# Patient Record
Sex: Female | Born: 1960 | Race: White | Hispanic: No | Marital: Married | Smoking: Former smoker
Health system: Southern US, Community
[De-identification: ages and names within clinical notes are randomized; demographics above are authoritative.]

## PROBLEM LIST (undated history)

## (undated) DIAGNOSIS — G4733 Obstructive sleep apnea (adult) (pediatric): Secondary | ICD-10-CM

## (undated) DIAGNOSIS — E039 Hypothyroidism, unspecified: Secondary | ICD-10-CM

## (undated) HISTORY — PX: EXCISIONAL HEMORRHOIDECTOMY: SHX1541

## (undated) HISTORY — DX: Hypothyroidism, unspecified: E03.9

---

## 1998-11-28 ENCOUNTER — Other Ambulatory Visit: Admission: RE | Admit: 1998-11-28 | Discharge: 1998-11-28 | Payer: Self-pay | Admitting: Obstetrics & Gynecology

## 1999-12-25 ENCOUNTER — Other Ambulatory Visit: Admission: RE | Admit: 1999-12-25 | Discharge: 1999-12-25 | Payer: Self-pay | Admitting: Obstetrics & Gynecology

## 2001-01-27 ENCOUNTER — Other Ambulatory Visit: Admission: RE | Admit: 2001-01-27 | Discharge: 2001-01-27 | Payer: Self-pay | Admitting: Obstetrics & Gynecology

## 2002-01-19 ENCOUNTER — Observation Stay (HOSPITAL_COMMUNITY): Admission: EM | Admit: 2002-01-19 | Discharge: 2002-01-20 | Payer: Self-pay

## 2002-03-08 ENCOUNTER — Other Ambulatory Visit: Admission: RE | Admit: 2002-03-08 | Discharge: 2002-03-08 | Payer: Self-pay | Admitting: Obstetrics & Gynecology

## 2002-04-30 ENCOUNTER — Emergency Department (HOSPITAL_COMMUNITY): Admission: EM | Admit: 2002-04-30 | Discharge: 2002-04-30 | Payer: Self-pay | Admitting: Emergency Medicine

## 2003-03-15 ENCOUNTER — Other Ambulatory Visit: Admission: RE | Admit: 2003-03-15 | Discharge: 2003-03-15 | Payer: Self-pay | Admitting: Obstetrics & Gynecology

## 2004-08-07 ENCOUNTER — Other Ambulatory Visit: Admission: RE | Admit: 2004-08-07 | Discharge: 2004-08-07 | Payer: Self-pay | Admitting: Obstetrics & Gynecology

## 2005-08-06 ENCOUNTER — Other Ambulatory Visit: Admission: RE | Admit: 2005-08-06 | Discharge: 2005-08-06 | Payer: Self-pay | Admitting: Obstetrics & Gynecology

## 2010-05-21 ENCOUNTER — Emergency Department (HOSPITAL_COMMUNITY): Admission: EM | Admit: 2010-05-21 | Discharge: 2010-05-21 | Payer: Self-pay | Admitting: Emergency Medicine

## 2010-05-21 IMAGING — CR DG CHEST 1V PORT
1 series · 1 of 1 positions shown · non-contrast
Comparison: None.

CLINICAL DATA: Chest pain.  Weakness.  Shoulder pain.

PORTABLE CHEST - 1 VIEW

[series [date]]
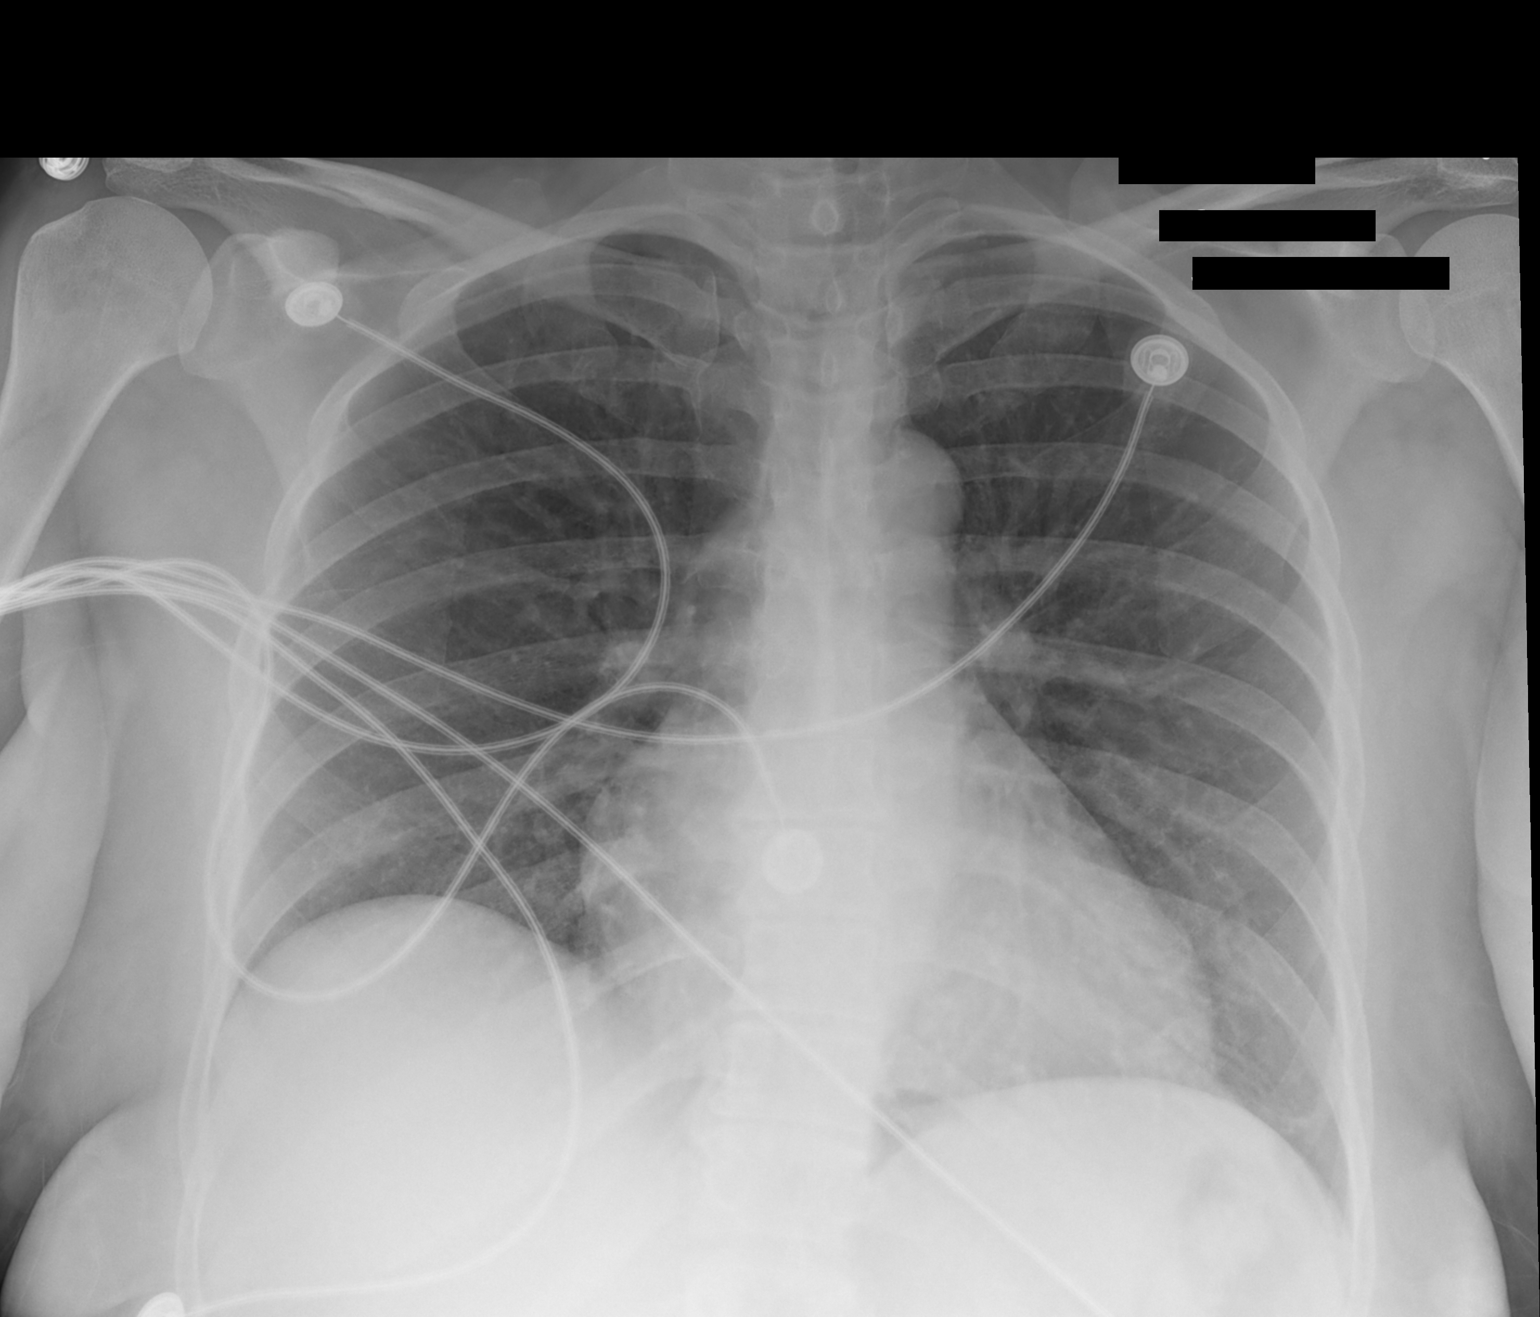

[1 of 1 positions shown; findings below may reference images not displayed]

FINDINGS: 2128 hours. The heart size and mediastinal contours are
normal.  The lungs are clear.  There is no pleural effusion or
pneumothorax.  No acute osseous findings are identified.  There are
multiple telemetry leads overlying the chest.
IMPRESSION: No active cardiopulmonary process.

## 2010-10-24 LAB — POCT CARDIAC MARKERS
CKMB, poc: <1 ng/mL — ABNORMAL LOW (ref 1.0–8.0)
Myoglobin, poc: 53.1 ng/mL (ref 12–200)
Troponin i, poc: <0.05 ng/mL (ref 0.00–0.09)

## 2010-10-24 LAB — POCT I-STAT, CHEM 8
BUN: 5 mg/dL — ABNORMAL LOW (ref 6–23)
Calcium, Ion: 1.17 mmol/L (ref 1.12–1.32)
Chloride: 104 mEq/L (ref 96–112)
Creatinine, Ser: 0.5 mg/dL (ref 0.4–1.2)
Glucose, Bld: 104 mg/dL — ABNORMAL HIGH (ref 70–99)
HCT: 41 % (ref 36.0–46.0)
Hemoglobin: 13.9 g/dL (ref 12.0–15.0)
Potassium: 3.5 mEq/L (ref 3.5–5.1)
Sodium: 140 mEq/L (ref 135–145)
TCO2: 27 mmol/L (ref 0–100)

## 2014-08-24 ENCOUNTER — Other Ambulatory Visit: Payer: Self-pay | Admitting: Obstetrics & Gynecology

## 2014-08-24 DIAGNOSIS — R928 Other abnormal and inconclusive findings on diagnostic imaging of breast: Secondary | ICD-10-CM

## 2014-09-05 ENCOUNTER — Ambulatory Visit
Admission: RE | Admit: 2014-09-05 | Discharge: 2014-09-05 | Disposition: A | Discharge status: Home / Self Care | Payer: BLUE CROSS/BLUE SHIELD | Source: Ambulatory Visit | Attending: Obstetrics & Gynecology | Admitting: Obstetrics & Gynecology

## 2014-09-05 DIAGNOSIS — R928 Other abnormal and inconclusive findings on diagnostic imaging of breast: Secondary | ICD-10-CM

## 2016-01-26 ENCOUNTER — Encounter: Payer: Self-pay | Admitting: Gastroenterology

## 2016-03-15 ENCOUNTER — Encounter (INDEPENDENT_AMBULATORY_CARE_PROVIDER_SITE_OTHER): Payer: Self-pay

## 2016-03-15 ENCOUNTER — Encounter: Payer: Self-pay | Admitting: Gastroenterology

## 2016-03-15 ENCOUNTER — Ambulatory Visit (AMBULATORY_SURGERY_CENTER): Payer: Self-pay

## 2016-03-15 VITALS — Ht 64.5 in | Wt 215.4 lb

## 2016-03-15 DIAGNOSIS — Z1211 Encounter for screening for malignant neoplasm of colon: Secondary | ICD-10-CM

## 2016-03-15 MED ORDER — SUPREP BOWEL PREP KIT 17.5-3.13-1.6 GM/177ML PO SOLN: 1.0000 | Freq: Once | ORAL | 0 refills | Status: AC

## 2016-03-18 ENCOUNTER — Telehealth: Payer: Self-pay | Admitting: *Deleted

## 2016-03-18 NOTE — Telephone Encounter (Signed)
LM for the patient asking to please call me back.  The date of the colon is 03-29-2016.  I let the patient know that the prep could cost around $ 150.00. With this coupon she shouldn't have to pay any more than $ 50.00.

## 2016-03-19 ENCOUNTER — Other Ambulatory Visit: Payer: Self-pay | Admitting: *Deleted

## 2016-03-19 NOTE — Telephone Encounter (Signed)
Called the patient and asked her for her pharmacy.She said it is [facility] [address]. She changed her date to October for the colonoscopy.  I advised that the prep can be as much as $ 150.00, the coupon we have she would not have to pay any more than $ 50.00.  She thanked me for that . I asked if she wanted to come here for it or could I mail it to her and she asked me to mail it. Mailed it today 03-19-2016. I verified the address with the patient.

## 2016-03-29 ENCOUNTER — Encounter: Payer: BLUE CROSS/BLUE SHIELD | Admitting: Gastroenterology

## 2016-06-04 ENCOUNTER — Encounter: Payer: BLUE CROSS/BLUE SHIELD | Admitting: Gastroenterology

## 2020-03-29 ENCOUNTER — Other Ambulatory Visit: Payer: Self-pay

## 2020-03-29 ENCOUNTER — Other Ambulatory Visit: Payer: Self-pay | Admitting: Critical Care Medicine

## 2020-03-29 DIAGNOSIS — Z20822 Contact with and (suspected) exposure to covid-19: Secondary | ICD-10-CM

## 2020-03-30 LAB — SARS-COV-2, NAA 2 DAY TAT

## 2020-03-30 LAB — NOVEL CORONAVIRUS, NAA: SARS-CoV-2, NAA: DETECTED — AB

## 2023-06-13 ENCOUNTER — Inpatient Hospital Stay (HOSPITAL_COMMUNITY): Payer: Self-pay

## 2023-06-13 ENCOUNTER — Emergency Department (HOSPITAL_COMMUNITY): Payer: Self-pay

## 2023-06-13 ENCOUNTER — Encounter (HOSPITAL_COMMUNITY): Payer: Self-pay | Admitting: Emergency Medicine

## 2023-06-13 ENCOUNTER — Inpatient Hospital Stay (HOSPITAL_COMMUNITY)
Admission: EM | Admit: 2023-06-13 | Discharge: 2023-06-17 | DRG: 062 | Disposition: A | Discharge status: Rehabilitation Facility | Payer: Self-pay | Attending: Neurology | Admitting: Neurology

## 2023-06-13 ENCOUNTER — Other Ambulatory Visit: Payer: Self-pay

## 2023-06-13 DIAGNOSIS — M62838 Other muscle spasm: Secondary | ICD-10-CM | POA: Diagnosis present

## 2023-06-13 DIAGNOSIS — I10 Essential (primary) hypertension: Secondary | ICD-10-CM | POA: Diagnosis present

## 2023-06-13 DIAGNOSIS — F17210 Nicotine dependence, cigarettes, uncomplicated: Secondary | ICD-10-CM | POA: Diagnosis present

## 2023-06-13 DIAGNOSIS — Z8673 Personal history of transient ischemic attack (TIA), and cerebral infarction without residual deficits: Secondary | ICD-10-CM

## 2023-06-13 DIAGNOSIS — E119 Type 2 diabetes mellitus without complications: Secondary | ICD-10-CM | POA: Diagnosis present

## 2023-06-13 DIAGNOSIS — I160 Hypertensive urgency: Secondary | ICD-10-CM | POA: Diagnosis present

## 2023-06-13 DIAGNOSIS — I639 Cerebral infarction, unspecified: Principal | ICD-10-CM | POA: Diagnosis present

## 2023-06-13 DIAGNOSIS — K5900 Constipation, unspecified: Secondary | ICD-10-CM | POA: Diagnosis present

## 2023-06-13 DIAGNOSIS — G8194 Hemiplegia, unspecified affecting left nondominant side: Secondary | ICD-10-CM | POA: Diagnosis present

## 2023-06-13 DIAGNOSIS — E876 Hypokalemia: Secondary | ICD-10-CM | POA: Diagnosis present

## 2023-06-13 DIAGNOSIS — I6389 Other cerebral infarction: Principal | ICD-10-CM | POA: Diagnosis present

## 2023-06-13 DIAGNOSIS — R29810 Facial weakness: Secondary | ICD-10-CM | POA: Diagnosis present

## 2023-06-13 DIAGNOSIS — R29705 NIHSS score 5: Secondary | ICD-10-CM | POA: Diagnosis present

## 2023-06-13 DIAGNOSIS — Z79899 Other long term (current) drug therapy: Secondary | ICD-10-CM

## 2023-06-13 DIAGNOSIS — E785 Hyperlipidemia, unspecified: Secondary | ICD-10-CM | POA: Diagnosis present

## 2023-06-13 DIAGNOSIS — Z6838 Body mass index (BMI) 38.0-38.9, adult: Secondary | ICD-10-CM

## 2023-06-13 DIAGNOSIS — I161 Hypertensive emergency: Secondary | ICD-10-CM

## 2023-06-13 DIAGNOSIS — R471 Dysarthria and anarthria: Secondary | ICD-10-CM | POA: Diagnosis present

## 2023-06-13 DIAGNOSIS — F17200 Nicotine dependence, unspecified, uncomplicated: Secondary | ICD-10-CM | POA: Diagnosis present

## 2023-06-13 DIAGNOSIS — F419 Anxiety disorder, unspecified: Secondary | ICD-10-CM | POA: Diagnosis present

## 2023-06-13 DIAGNOSIS — G4733 Obstructive sleep apnea (adult) (pediatric): Secondary | ICD-10-CM | POA: Diagnosis present

## 2023-06-13 DIAGNOSIS — E669 Obesity, unspecified: Secondary | ICD-10-CM | POA: Diagnosis present

## 2023-06-13 HISTORY — DX: Obstructive sleep apnea (adult) (pediatric): G47.33

## 2023-06-13 LAB — DIFFERENTIAL
Abs Immature Granulocytes: 0.02 10*3/uL (ref 0.00–0.07)
Basophils Absolute: 0.1 10*3/uL (ref 0.0–0.1)
Basophils Relative: 1 %
Eosinophils Absolute: 0.2 10*3/uL (ref 0.0–0.5)
Eosinophils Relative: 2 %
Immature Granulocytes: 0 %
Lymphocytes Relative: 33 %
Lymphs Abs: 3.2 10*3/uL (ref 0.7–4.0)
Monocytes Absolute: 0.6 10*3/uL (ref 0.1–1.0)
Monocytes Relative: 6 %
Neutro Abs: 5.5 10*3/uL (ref 1.7–7.7)
Neutrophils Relative %: 58 %

## 2023-06-13 LAB — COMPREHENSIVE METABOLIC PANEL
ALT: 20 U/L (ref 0–44)
AST: 19 U/L (ref 15–41)
Albumin: 4.1 g/dL (ref 3.5–5.0)
Alkaline Phosphatase: 71 U/L (ref 38–126)
Anion gap: 11 (ref 5–15)
BUN: 16 mg/dL (ref 6–20)
CO2: 28 mmol/L (ref 22–32)
Calcium: 9.6 mg/dL (ref 8.9–10.3)
Chloride: 97 mmol/L — ABNORMAL LOW (ref 98–111)
Creatinine, Ser: 0.85 mg/dL (ref 0.44–1.00)
GFR, Estimated: >60 mL/min (ref >60)
Glucose, Bld: 124 mg/dL — ABNORMAL HIGH (ref 70–99)
Potassium: 3.1 mmol/L — ABNORMAL LOW (ref 3.5–5.1)
Sodium: 136 mmol/L (ref 135–145)
Total Bilirubin: 0.2 mg/dL — ABNORMAL LOW (ref 0.3–1.2)
Total Protein: 7.4 g/dL (ref 6.5–8.1)

## 2023-06-13 LAB — URINALYSIS, ROUTINE W REFLEX MICROSCOPIC
Bilirubin Urine: NEGATIVE
Glucose, UA: NEGATIVE mg/dL
Ketones, ur: NEGATIVE mg/dL
Leukocytes,Ua: NEGATIVE
Nitrite: NEGATIVE
Protein, ur: NEGATIVE mg/dL
Specific Gravity, Urine: 1.027 (ref 1.005–1.030)
pH: 6 (ref 5.0–8.0)

## 2023-06-13 LAB — PROTIME-INR
INR: 0.9 (ref 0.8–1.2)
Prothrombin Time: 12.5 s (ref 11.4–15.2)

## 2023-06-13 LAB — I-STAT CHEM 8, ED
BUN: 16 mg/dL (ref 6–20)
Calcium, Ion: 1.2 mmol/L (ref 1.15–1.40)
Chloride: 102 mmol/L (ref 98–111)
Creatinine, Ser: 0.9 mg/dL (ref 0.44–1.00)
Glucose, Bld: 118 mg/dL — ABNORMAL HIGH (ref 70–99)
HCT: 40 % (ref 36.0–46.0)
Hemoglobin: 13.6 g/dL (ref 12.0–15.0)
Potassium: 3.4 mmol/L — ABNORMAL LOW (ref 3.5–5.1)
Sodium: 141 mmol/L (ref 135–145)
TCO2: 27 mmol/L (ref 22–32)

## 2023-06-13 LAB — CBC
HCT: 39.7 % (ref 36.0–46.0)
Hemoglobin: 12.6 g/dL (ref 12.0–15.0)
MCH: 27.7 pg (ref 26.0–34.0)
MCHC: 31.7 g/dL (ref 30.0–36.0)
MCV: 87.3 fL (ref 80.0–100.0)
Platelets: 299 10*3/uL (ref 150–400)
RBC: 4.55 MIL/uL (ref 3.87–5.11)
RDW: 13.1 % (ref 11.5–15.5)
WBC: 9.5 10*3/uL (ref 4.0–10.5)
nRBC: 0 % (ref 0.0–0.2)

## 2023-06-13 LAB — RAPID URINE DRUG SCREEN, HOSP PERFORMED
Amphetamines: NOT DETECTED
Barbiturates: NOT DETECTED
Benzodiazepines: NOT DETECTED
Cocaine: NOT DETECTED
Opiates: NOT DETECTED
Tetrahydrocannabinol: NOT DETECTED

## 2023-06-13 LAB — APTT: aPTT: 25 s (ref 24–36)

## 2023-06-13 LAB — CBG MONITORING, ED: Glucose-Capillary: 132 mg/dL — ABNORMAL HIGH (ref 70–99)

## 2023-06-13 LAB — ETHANOL: Alcohol, Ethyl (B): <10 mg/dL (ref <10)

## 2023-06-13 LAB — HCG, SERUM, QUALITATIVE: Preg, Serum: NEGATIVE

## 2023-06-13 MED ORDER — SENNOSIDES-DOCUSATE SODIUM 8.6-50 MG PO TABS: 1.0000 | ORAL_TABLET | Freq: Every evening | ORAL | Status: DC | PRN

## 2023-06-13 MED ORDER — STROKE: EARLY STAGES OF RECOVERY BOOK
Freq: Once | Status: AC
Start: 2023-06-14 — End: 2023-06-14
  Filled 2023-06-13: qty 1

## 2023-06-13 MED ORDER — POTASSIUM CHLORIDE 10 MEQ/100ML IV SOLN
10.0000 meq | INTRAVENOUS | Status: AC
  Administered 2023-06-13 (×2): 10 meq via INTRAVENOUS
  Filled 2023-06-13 (×2): qty 100

## 2023-06-13 MED ORDER — LABETALOL HCL 5 MG/ML IV SOLN
10.0000 mg | Freq: Once | INTRAVENOUS | Status: AC
  Administered 2023-06-13: 10 mg via INTRAVENOUS
  Filled 2023-06-13: qty 4

## 2023-06-13 MED ORDER — ACETAMINOPHEN 325 MG PO TABS
650.0000 mg | ORAL_TABLET | ORAL | Status: DC | PRN
  Filled 2023-06-13: qty 2

## 2023-06-13 MED ORDER — ACETAMINOPHEN 160 MG/5ML PO SOLN
650.0000 mg | ORAL | Status: DC | PRN
Start: 2023-06-13 — End: 2023-06-14

## 2023-06-13 MED ORDER — IOHEXOL 350 MG/ML SOLN
75.0000 mL | Freq: Once | INTRAVENOUS | Status: AC | PRN
  Administered 2023-06-13: 75 mL via INTRAVENOUS

## 2023-06-13 MED ORDER — HYDRALAZINE HCL 20 MG/ML IJ SOLN
10.0000 mg | Freq: Once | INTRAMUSCULAR | Status: DC
  Filled 2023-06-13: qty 1

## 2023-06-13 MED ORDER — TENECTEPLASE FOR STROKE
25.0000 mg | PACK | Freq: Once | INTRAVENOUS | Status: AC
Start: 2023-06-13 — End: 2023-06-13
  Administered 2023-06-13: 25 mg via INTRAVENOUS
  Filled 2023-06-13 (×2): qty 10

## 2023-06-13 MED ORDER — ACETAMINOPHEN 650 MG RE SUPP
650.0000 mg | RECTAL | Status: DC | PRN
  Administered 2023-06-14: 650 mg via RECTAL
  Filled 2023-06-13: qty 1

## 2023-06-13 MED ORDER — SODIUM CHLORIDE 0.9 % IV SOLN: INTRAVENOUS | Status: DC

## 2023-06-13 MED ORDER — CLEVIDIPINE BUTYRATE 0.5 MG/ML IV EMUL
0.0000 mg/h | INTRAVENOUS | Status: DC
  Administered 2023-06-13: 2 mg/h via INTRAVENOUS
  Administered 2023-06-14: 4 mg/h via INTRAVENOUS
  Filled 2023-06-13 (×3): qty 50

## 2023-06-13 MED ORDER — PANTOPRAZOLE SODIUM 40 MG IV SOLR
40.0000 mg | Freq: Every day | INTRAVENOUS | Status: DC
  Administered 2023-06-13 – 2023-06-15 (×3): 40 mg via INTRAVENOUS
  Filled 2023-06-13 (×3): qty 10

## 2023-06-13 MED ORDER — POTASSIUM CHLORIDE CRYS ER 20 MEQ PO TBCR: 40.0000 meq | EXTENDED_RELEASE_TABLET | Freq: Once | ORAL | Status: DC

## 2023-06-13 NOTE — ED Notes (Signed)
Pt back from MRI 

## 2023-06-13 NOTE — ED Notes (Signed)
PA called to bedside to assess patient.

## 2023-06-13 NOTE — ED Notes (Signed)
[facility] called for CODE STROKE.

## 2023-06-13 NOTE — ED Provider Triage Note (Signed)
Emergency Medicine Provider Triage Evaluation Note  Rebekah Mendez , a 62 y.o. female  was evaluated in triage.  Pt complains of left sided numbness. States that about 30 minutes ago while eating dinner began to experience left sided numbness in upper and lower extremity. When attempting to walk, felt that left leg could not move well and was unable to lift leg off ground fully. Feels that symptoms have resolved, but still having mild sensation impairment to the left side. Left arm worse than left leg. Mild headache with notable HTN, but not on any BP medications.  Review of Systems  Positive: As above Negative: As above  Physical Exam  BP (!) 215/116   Pulse 74   Resp 16   Ht 5' 4.5" (1.638 m)   Wt 102.1 kg   SpO2 97%   BMI 38.02 kg/m  Gen:   Awake, no distress   Resp:  Normal effort  MSK:   Moves extremities without difficulty  Other:  Neurologically at baseline, pupils PERRL, strength in upper and lower extremities nearly identical and possibly slightly diminished on left side  Medical Decision Making  Medically screening exam initiated at 7:16 PM.  Appropriate orders placed.  Rebekah Mendez was informed that the remainder of the evaluation will be completed by another provider, this initial triage assessment does not replace that evaluation, and the importance of remaining in the ED until their evaluation is complete.  Given some paraesthesia on left side with HTN with BP >180/>110, will initiate code stroke for emergent CT head and labs.   Smitty Knudsen, PA-C 06/13/23 1916

## 2023-06-13 NOTE — ED Provider Notes (Signed)
[place] EMERGENCY DEPARTMENT AT [facility] Provider Note   CSN: [id number] Arrival date & time: 06/13/23  1847     History  Chief Complaint  Patient presents with   Numbness    Rebekah Mendez is a 62 y.o. female.  Patient is a 61 year old female with no known past medical history presenting to the emergency department with left-sided numbness and weakness.  The patient states that she finished eating dinner around 6:30 PM this evening and when she tried to get up she felt like she was unable to lift her left leg felt heavy and numb.  She states that her left arm feels heavy and numb as well as the left side of her face.  She denies any vision changes.  She states that she has a mild headache.  She denies any blood thinner use.  The history is provided by the patient and the spouse. The history is limited by the condition of the patient (critical acuity).       Home Medications Prior to Admission medications   Medication Sig Start Date End Date Taking? Authorizing Provider  phentermine 37.5 MG capsule Take 37.5 mg by mouth every morning.    [provider]      Allergies    Patient has no known allergies.    Review of Systems   Review of Systems  Physical Exam Updated Vital Signs BP (!) 174/87   Pulse 78   Temp (!) 97.5 F (36.4 C) (Oral)   Resp 12   Ht 5' 4.5" (1.638 m)   Wt 109.7 kg   SpO2 99%   BMI 40.86 kg/m  Physical Exam Vitals and nursing note reviewed.  Constitutional:      Appearance: Normal appearance. She is obese.     Comments: Anxious appearing, tearful on exam  HENT:     Head: Normocephalic and atraumatic.     Nose: Nose normal.     Mouth/Throat:     Mouth: Mucous membranes are moist.     Pharynx: Oropharynx is clear.  Eyes:     Extraocular Movements: Extraocular movements intact.     Conjunctiva/sclera: Conjunctivae normal.     Pupils: Pupils are equal, round, and reactive to light.  Cardiovascular:     Rate and  Rhythm: Normal rate and regular rhythm.     Heart sounds: Normal heart sounds.  Pulmonary:     Effort: Pulmonary effort is normal.     Breath sounds: Normal breath sounds.  Abdominal:     General: Abdomen is flat.     Palpations: Abdomen is soft.     Tenderness: There is no abdominal tenderness.  Musculoskeletal:        General: Normal range of motion.     Cervical back: Normal range of motion.  Skin:    General: Skin is warm and dry.  Neurological:     Mental Status: She is alert and oriented to person, place, and time.     Comments: No obvious facial droop, subjectively decreased sensation on left side of face, left arm and left leg Drift in left upper extremity, unable to hold left leg up off the bed Grip strength intact bilaterally, 4-5 strength in plantar/dorsi flexion on the left leg Normal finger-to-nose bilaterally  Psychiatric:        Mood and Affect: Mood normal.        Behavior: Behavior normal.     ED Results / Procedures / Treatments   Labs (all labs ordered  are listed, but only abnormal results are displayed) Labs Reviewed  I-STAT CHEM 8, ED - Abnormal; Notable for the following components:      Result Value   Potassium 3.4 (*)    Glucose, Bld 118 (*)    All other components within normal limits  CBG MONITORING, ED - Abnormal; Notable for the following components:   Glucose-Capillary 132 (*)    All other components within normal limits  PROTIME-INR  APTT  CBC  DIFFERENTIAL  ETHANOL  COMPREHENSIVE METABOLIC PANEL  RAPID URINE DRUG SCREEN, HOSP PERFORMED  URINALYSIS, ROUTINE W REFLEX MICROSCOPIC  HCG, SERUM, QUALITATIVE    EKG EKG Interpretation Date/Time:  Friday June 13 2023 19:30:43 EDT Ventricular Rate:  76 PR Interval:  175 QRS Duration:  88 QT Interval:  380 QTC Calculation: 428 R Axis:   48  Text Interpretation: Sinus rhythm Low voltage, precordial leads Borderline T abnormalities, anterior leads Since last tracing of earlier today No  significant change since last tracing Confirmed by Elayne Snare ([id number]) on 06/13/2023 7:39:13 PM  Radiology CT Angio Head Neck W WO CM  Result Date: 06/13/2023 CLINICAL DATA:  Left-side feels heavy EXAM: CT ANGIOGRAPHY HEAD AND NECK WITH AND WITHOUT CONTRAST TECHNIQUE: Multidetector CT imaging of the head and neck was performed using the standard protocol during bolus administration of intravenous contrast. Multiplanar CT image reconstructions and MIPs were obtained to evaluate the vascular anatomy. Carotid stenosis measurements (when applicable) are obtained utilizing NASCET criteria, using the distal internal carotid diameter as the denominator. RADIATION DOSE REDUCTION: This exam was performed according to the departmental dose-optimization program which includes automated exposure control, adjustment of the mA and/or kV according to patient size and/or use of iterative reconstruction technique. COMPARISON:  No prior CTA available, correlation is made with CT head 06/13/2023 FINDINGS: CT HEAD FINDINGS For noncontrast findings, please see same day CT head. CTA NECK FINDINGS Aortic arch: Standard branching. Imaged portion shows no evidence of aneurysm or dissection. No significant stenosis of the major arch vessel origins. Right carotid system: No evidence of dissection, occlusion, or hemodynamically significant stenosis (greater than 50%). Beading of the mid right ICA (series 12, image 120 and series 11, image 144). Left carotid system: No evidence of dissection, occlusion, or hemodynamically significant stenosis (greater than 50%). Beading of the mid left ICA (series 13, image 134 and series 11, image 147). Vertebral arteries: No evidence of dissection, occlusion, or hemodynamically significant stenosis (greater than 50%). Beading at the bilateral V3 segments (series 11, images 144-148). Skeleton: No acute osseous abnormality. Degenerative changes in the cervical spine. Other neck: 4.3 cm heterogeneously  enhancing mass in the right thyroid lobe. Otherwise negative. Upper chest: No focal pulmonary opacity or pleural effusion. Review of the MIP images confirms the above findings CTA HEAD FINDINGS Anterior circulation: Both internal carotid arteries are patent to the termini, without significant stenosis. A1 segments patent. Normal anterior communicating artery. Anterior cerebral arteries are patent to their distal aspects without significant stenosis. No M1 stenosis or occlusion. MCA branches perfused to their distal aspects without significant stenosis. Posterior circulation: Vertebral arteries patent to the vertebrobasilar junction without significant stenosis. Posterior inferior cerebellar arteries patent proximally. Basilar patent to its distal aspect without significant stenosis. Superior cerebellar arteries patent proximally. Patent P1 segments. PCAs perfused to their distal aspects without significant stenosis. The left posterior communicating arteries patent. Venous sinuses: As permitted by contrast timing, patent. Anatomic variants: None significant. No evidence of aneurysm or vascular malformation. Review of the MIP images  confirms the above findings IMPRESSION: 1. No intracranial large vessel occlusion or significant stenosis. 2. No hemodynamically significant stenosis in the neck. 3. Beading of the bilateral mid ICAs and bilateral V3 segments, which can be seen in the setting of fibromuscular dysplasia. 4. 4.3 cm heterogeneously enhancing mass in the right thyroid lobe. If this has not previously been evaluated, a non-emergent ultrasound of the thyroid is recommended. (Reference: J Am Coll Radiol. 2015 Feb;12(2): 143-50) Electronically Signed   By: Wiliam Ke M.D.   On: 06/13/2023 19:59   CT HEAD CODE STROKE WO CONTRAST  Result Date: 06/13/2023 CLINICAL DATA:  Code stroke.  Left-side feeling heavy EXAM: CT HEAD WITHOUT CONTRAST TECHNIQUE: Contiguous axial images were obtained from the base of the  skull through the vertex without intravenous contrast. RADIATION DOSE REDUCTION: This exam was performed according to the departmental dose-optimization program which includes automated exposure control, adjustment of the mA and/or kV according to patient size and/or use of iterative reconstruction technique. COMPARISON:  None Available. FINDINGS: Brain: No evidence of acute infarction, hemorrhage, mass, mass effect, or midline shift. No hydrocephalus or extra-axial collection. Vascular: No hyperdense vessel. Skull: Negative for fracture or focal lesion. Sinuses/Orbits: No acute finding. Other: The mastoid air cells are well aerated. ASPECTS [facility] Stroke Program Early CT Score) - Ganglionic level infarction (caudate, lentiform nuclei, internal capsule, insula, M1-M3 cortex): 7 - Supraganglionic infarction (M4-M6 cortex): 3 Total score (0-10 with 10 being normal): 10 IMPRESSION: No acute intracranial process. ASPECTS is 10. Code stroke imaging results were communicated on 06/13/2023 at 7:50 pm to provider Dr. Wilford Corner via telephone, who verbally acknowledged these results. Electronically Signed   By: Wiliam Ke M.D.   On: 06/13/2023 19:51    Procedures .Critical Care  Performed by: Rexford Maus, DO Authorized by: Rexford Maus, DO   Critical care provider statement:    Critical care time (minutes):  40   Critical care time was exclusive of:  Separately billable procedures and treating other patients   Critical care was necessary to treat or prevent imminent or life-threatening deterioration of the following conditions:  CNS failure or compromise   Critical care was time spent personally by me on the following activities:  Development of treatment plan with patient or surrogate, discussions with consultants, evaluation of patient's response to treatment, examination of patient, obtaining history from patient or surrogate, ordering and performing treatments and interventions, ordering and  review of laboratory studies, ordering and review of radiographic studies, pulse oximetry, re-evaluation of patient's condition and review of old charts   I assumed direction of critical care for this patient from another provider in my specialty: no     Care discussed with: admitting provider       Medications Ordered in ED Medications  hydrALAZINE (APRESOLINE) injection 10 mg (has no administration in time range)  clevidipine (CLEVIPREX) infusion 0.5 mg/mL (has no administration in time range)  iohexol (OMNIPAQUE) 350 MG/ML injection 75 mL (75 mLs Intravenous Contrast Given 06/13/23 1951)  labetalol (NORMODYNE) injection 10 mg (10 mg Intravenous Given 06/13/23 2004)  tenecteplase (TNKASE) injection for Stroke 25 mg (25 mg Intravenous Given 06/13/23 2007)    ED Course/ Medical Decision Making/ A&P Clinical Course as of 06/13/23 2013  Fri Jun 13, 2023  2012 Dr. Wilford Corner with neurology evaluated the patient and deemed she was a TNK candidate.  She was given labetalol for blood pressure control and TNK was administered at 8:07 PM.  Patient will be admitted to neuro  ICU.  Cleviprex drip has been ordered for blood pressure goal less than 180/105. [VK]    Clinical Course User Index [VK] Rexford Maus, DO                                 Medical Decision Making This patient presents to the ED with chief complaint(s) of left-sided numbness/weakness with no pertinent past medical history which further complicates the presenting complaint. The complaint involves an extensive differential diagnosis and also carries with it a high risk of complications and morbidity.    The differential diagnosis includes CVA, TIA, ICH, mass effect, electrolyte abnormality, complex migraine  Additional history obtained: Additional history obtained from spouse Records reviewed N/A  ED Course and Reassessment: On patient's arrival to the emergency department she was hypertensive to 200s, otherwise  hemodynamically stable, anxious appearing.  Patient was initially evaluated and rapid assessment and with concern for new onset neurologic deficits stroke alert was called I immediately evaluated the patient at bedside.  Teleneurology was consulted.  Patient did have notable drift in left upper and lower extremity with decreased sensation in left face, left arm and left leg.  Patient was extremely hypertensive 215/116 in the room, Accu-Chek was normal.  IV access was obtained and she was immediately brought to CT scan.  Independent labs interpretation:  The following labs were independently interpreted: Within normal range  Independent visualization of imaging: - I independently visualized the following imaging with scope of interpretation limited to determining acute life threatening conditions related to emergency care: CT head/CTA, which revealed no acute disease, no large vessel occlusion  Consultation: - Consulted or discussed management/test interpretation w/ external professional: Neurology  Consideration for admission or further workup: Patient requires admission for her acute stroke Social Determinants of health: N/A    Amount and/or Complexity of Data Reviewed Radiology: ordered.  Risk Prescription drug management. Decision regarding hospitalization.          Final Clinical Impression(s) / ED Diagnoses Final diagnoses:  Cerebrovascular accident (CVA), unspecified mechanism [facility])  Hypertensive emergency    Rx / DC Orders ED Discharge Orders     None         Rexford Maus, DO 06/13/23 2013

## 2023-06-13 NOTE — Progress Notes (Signed)
1924: Tele stroke cart activated at this time. EDP at bedside.   1933: Patient to CT  1934: TSP paged at this time.   1936: TSP on camera at this time. Pt in CT. TSP assessing patient at this time. Advanced imaging completed.   1950: Results of NCCT given to TSP at this time. Pt returned from CT.   2004: Results of advanced imaging given to TSP at this time.   2007: Time out performed. TNK 25mg  given at this time.   2010: TSP off camera at this time. TSP to follow up with EDP for plan of care.   2037: TSRN off cart at this time. Post TNK care reviewed with bedside RN.

## 2023-06-13 NOTE — ED Triage Notes (Signed)
Patient arrives ambulatory by POV c/o left side feeling heavy. States when she stood up after dinner about 25 mins ago noticed she could not lift her left foot. Patient states the sensation has improved to her leg however left arm still feeling heavy. Decreased sensation to left arm and leg leg. Reports mild headache. Family reports patient under a lot of work stress.

## 2023-06-13 NOTE — ED Notes (Signed)
[place], RN transporting pt to MRI

## 2023-06-13 NOTE — Consult Note (Signed)
[facility] Telemedicine Consult   Requesting Provider: Dr. Theresia Lo Consult Participants: Dr. Marthe Patch, Telespecialist RN Lowella Bandy   bedside RN Elizabeth/Sierra/destiny Location of the provider: Home Location of the patient: [facility], ER bed 14  This consult was provided via telemedicine with 2-way video and audio communication. The patient/family was informed that care would be provided in this way and agreed to receive care in this manner.   Chief Complaint: Left-sided weakness  HPI: 62 year old woman with no significant past medical history because she has not seen a physician presented to the emergency department for evaluation of sudden onset of left-sided face arm and leg weakness along with decreased sensation on the left and some slurred speech. She was having dinner around 6:30 PM when she had sudden onset of left-sided numbness and weakness.  She was brought into the hospital via private vehicle by family and a code stroke was activated at [facility].  I was able to see her over the camera.  I was able to review images personally. Her blood pressures were systolic 225 on arrival.  She was also very anxious. Her symptoms had not resolved ever since starting at 6:30 PM. Denied any chest pain shortness of breath nausea vomiting.  Denied being on any blood thinners or any recent surgeries   Past Medical History:  Diagnosis Date   Thyroid activity decreased    late 20's     Current Facility-Administered Medications:    clevidipine (CLEVIPREX) infusion 0.5 mg/mL, 0-21 mg/hr, Intravenous, Continuous, Milon Dikes, MD   hydrALAZINE (APRESOLINE) injection 10 mg, 10 mg, Intravenous, Once, Milon Dikes, MD  Current Outpatient Medications:    phentermine 37.5 MG capsule, Take 37.5 mg by mouth every morning., Disp: , Rfl:     LKW: 6:30 PM IV thrombolysis given?:  Yes-needle time TNK 2007 hrs. IR Thrombectomy? No, no ELVO Modified Rankin Scale: 0-Completely  asymptomatic and back to baseline post- stroke Time of teleneurologist evaluation: 1935 hrs.  Exam: Vitals:   06/13/23 2005 06/13/23 2010  BP: (!) 182/100 (!) 174/87  Pulse: 79 78  Resp: 14 12  Temp:    SpO2: 99% 99%    General: Awake alert somewhat anxious looking Neurological exam Awake alert oriented x 3.  No aphasia.  Mild dysarthria.  No facial asymmetry.  Left upper extremity drift.  Minimal left lower extremity drift.  Left hemisensory loss.  No dysmetria.   NIHSS 1A: Level of Consciousness - 0 1B: Ask Month and Age - 0 1C: 'Blink Eyes' & 'Squeeze Hands' - 0 2: Test Horizontal Extraocular Movements - 0 3: Test Visual Fields - 0 4: Test Facial Palsy - 0 5A: Test Left Arm Motor Drift - 1 5B: Test Right Arm Motor Drift - 0 6A: Test Left Leg Motor Drift - 1 6B: Test Right Leg Motor Drift - 0 7: Test Limb Ataxia - 0 8: Test Sensation - 1 9: Test Language/Aphasia- 0 10: Test Dysarthria - 1 11: Test Extinction/Inattention - 0 NIHSS score: 4   Imaging Reviewed: CT head-aspects 10.  CT angiography head and neck no ELVO  Labs reviewed in epic and pertinent values follow: CBC    Component Value Date/Time   WBC 9.5 06/13/2023 1923   RBC 4.55 06/13/2023 1923   HGB 13.6 06/13/2023 1935   HCT 40.0 06/13/2023 1935   PLT 299 06/13/2023 1923   MCV 87.3 06/13/2023 1923   MCH 27.7 06/13/2023 1923   MCHC 31.7 06/13/2023 1923   RDW 13.1 06/13/2023 1923  LYMPHSABS 3.2 06/13/2023 1923   MONOABS 0.6 06/13/2023 1923   EOSABS 0.2 06/13/2023 1923   BASOSABS 0.1 06/13/2023 1923   CMP     Component Value Date/Time   NA 141 06/13/2023 1935   K 3.4 (L) 06/13/2023 1935   CL 102 06/13/2023 1935   GLUCOSE 118 (H) 06/13/2023 1935   BUN 16 06/13/2023 1935   CREATININE 0.90 06/13/2023 1935     Assessment: 62 year old with no past medical history coming in extremely hypertensive with complaints of diminished sensation of the left side of face arm and leg of sudden onset while at  dinner with symptoms still persistent at the time of evaluation. Differentials include hypertensive urgency versus small vessel etiology stroke. Risk benefits and alternatives of IV TNKase were discussed with the patient with the thought process that if this is a stroke, she is within the time window for treatment and getting further imaging in terms of MRI may be more time-consuming and get her outside of the window of treatment if she were to have a confirmed MRI stroke. After consideration of the above, she agreed to proceed with the TNK administration. She required blood pressure management with labetalol 10 mg IV x 1 prior to TNK being administered.  Impression: Small vessel etiology ischemic infarct versus sensory changes related to hypertensive urgency  Recommendations:  Admit to [facility] [place] neurological ICU under Dr. Otelia Limes Stroke team will be primary from the morning. Frequent neurochecks per the post TNK protocol. Frequent vital checks per the ICU/TNK protocol. No antiplatelets or anticoagulants 24-hour MRI at 8 PM on 06/14/2023. If the above imaging is negative for any evidence of bleed, can start antiplatelets at that time-will defer to the stroke team rounding. 2D echo A1c Lipid panel PT, OT and speech therapy assessments Blood pressure goal strictly below 180/105.  She has received labetalol x 1.  Hydralazine 10 mg x 1 IV has been ordered.  Cleviprex drip has been ordered to maintain the blood pressure below goal. All questions of the staff at bedside as well as the patient and her husband were answered over the camera. [facility] telestroke RN to continue monitoring and assist with blood pressure management at bedside. Dr. Otelia Limes at [facility] made aware of the patient Plan discussed with Dr. Theresia Lo at bedside over the camera   Risks benefits and alternatives of IV TNKase discussed CT head personally reviewed prior to TNK administration-no evidence of  bleed  Delays in the process:  -Delay in code stroke activation -CT scan obtained with 1.5 mm slices which later had to be corrected by the technologist to the proper 5 mm thick slices -Pharmacy requiring verification before as needed antihypertensives could be given. -TNK to bed also probably delayed-will need to check bedside record for timing -Required blood pressure management prior to administration of TNK.  -- Milon Dikes, MD Neurologist [facility] Pager: [phone]   CRITICAL CARE ATTESTATION Performed by: Milon Dikes, MD Total critical care time: 40 minutes Critical care time was exclusive of separately billable procedures and treating other patients and/or supervising APPs/Residents/Students Critical care was necessary to treat or prevent imminent or life-threatening deterioration. This patient is critically ill and at significant risk for neurological worsening and/or death and care requires constant monitoring. Critical care was time spent personally by me on the following activities: development of treatment plan with patient and/or surrogate as well as nursing, discussions with consultants, evaluation of patient's response to treatment, examination of patient, obtaining history from patient  or surrogate, ordering and performing treatments and interventions, ordering and review of laboratory studies, ordering and review of radiographic studies, pulse oximetry, re-evaluation of patient's condition, participation in multidisciplinary rounds and medical decision making of high complexity in the care of this patient.

## 2023-06-13 NOTE — ED Notes (Signed)
PA states not activating code stroke at this time.

## 2023-06-14 ENCOUNTER — Inpatient Hospital Stay (HOSPITAL_COMMUNITY): Payer: Self-pay

## 2023-06-14 DIAGNOSIS — I639 Cerebral infarction, unspecified: Secondary | ICD-10-CM

## 2023-06-14 DIAGNOSIS — I161 Hypertensive emergency: Secondary | ICD-10-CM

## 2023-06-14 DIAGNOSIS — I6389 Other cerebral infarction: Secondary | ICD-10-CM

## 2023-06-14 LAB — LIPID PANEL
Cholesterol: 178 mg/dL (ref 0–200)
HDL: 51 mg/dL (ref >40)
LDL Cholesterol: 111 mg/dL — ABNORMAL HIGH (ref 0–99)
Total CHOL/HDL Ratio: 3.5 {ratio}
Triglycerides: 78 mg/dL (ref <150)
VLDL: 16 mg/dL (ref 0–40)

## 2023-06-14 LAB — ECHOCARDIOGRAM COMPLETE
Height: 64.5 in
S' Lateral: 2.6 cm
Weight: 3654.34 [oz_av]

## 2023-06-14 LAB — HIV ANTIBODY (ROUTINE TESTING W REFLEX): HIV Screen 4th Generation wRfx: NONREACTIVE

## 2023-06-14 LAB — PHOSPHORUS: Phosphorus: 4 mg/dL (ref 2.5–4.6)

## 2023-06-14 LAB — HEMOGLOBIN A1C
Hgb A1c MFr Bld: 5.6 % (ref 4.8–5.6)
Mean Plasma Glucose: 114.02 mg/dL

## 2023-06-14 LAB — MAGNESIUM: Magnesium: 2.1 mg/dL (ref 1.7–2.4)

## 2023-06-14 LAB — MRSA NEXT GEN BY PCR, NASAL: MRSA by PCR Next Gen: NOT DETECTED

## 2023-06-14 MED ORDER — HYDRALAZINE HCL 20 MG/ML IJ SOLN
20.0000 mg | Freq: Four times a day (QID) | INTRAMUSCULAR | Status: DC | PRN
  Administered 2023-06-14 – 2023-06-15 (×3): 20 mg via INTRAVENOUS
  Filled 2023-06-14 (×3): qty 1

## 2023-06-14 MED ORDER — NICOTINE 7 MG/24HR TD PT24
7.0000 mg | MEDICATED_PATCH | Freq: Every day | TRANSDERMAL | Status: DC
  Filled 2023-06-14 (×4): qty 1

## 2023-06-14 MED ORDER — ONDANSETRON HCL 4 MG/2ML IJ SOLN
INTRAMUSCULAR | Status: AC
  Administered 2023-06-14: 4 mg
  Filled 2023-06-14: qty 2

## 2023-06-14 MED ORDER — ONDANSETRON HCL 4 MG/2ML IJ SOLN
4.0000 mg | Freq: Four times a day (QID) | INTRAMUSCULAR | Status: DC
  Filled 2023-06-14: qty 2

## 2023-06-14 MED ORDER — ONDANSETRON HCL 4 MG/2ML IJ SOLN
4.0000 mg | Freq: Four times a day (QID) | INTRAMUSCULAR | Status: DC | PRN
  Administered 2023-06-15: 4 mg via INTRAVENOUS
  Filled 2023-06-14: qty 2

## 2023-06-14 MED ORDER — MELATONIN 3 MG PO TABS
3.0000 mg | ORAL_TABLET | Freq: Every day | ORAL | Status: DC
  Administered 2023-06-14 – 2023-06-16 (×3): 3 mg via ORAL
  Filled 2023-06-14 (×3): qty 1

## 2023-06-14 MED ORDER — ALPRAZOLAM 0.25 MG PO TABS
0.2500 mg | ORAL_TABLET | Freq: Two times a day (BID) | ORAL | Status: DC | PRN
  Administered 2023-06-15 (×2): 0.25 mg via ORAL
  Filled 2023-06-14 (×2): qty 1

## 2023-06-14 MED ORDER — ROSUVASTATIN CALCIUM 20 MG PO TABS
20.0000 mg | ORAL_TABLET | Freq: Every day | ORAL | Status: DC
Start: 2023-06-14 — End: 2023-06-17
  Administered 2023-06-14 – 2023-06-17 (×4): 20 mg via ORAL
  Filled 2023-06-14 (×4): qty 1

## 2023-06-14 MED ORDER — CHLORHEXIDINE GLUCONATE CLOTH 2 % EX PADS
6.0000 | MEDICATED_PAD | Freq: Every day | CUTANEOUS | Status: DC
  Administered 2023-06-14 – 2023-06-16 (×3): 6 via TOPICAL

## 2023-06-14 MED ORDER — POTASSIUM CHLORIDE 20 MEQ PO PACK
20.0000 meq | PACK | Freq: Once | ORAL | Status: AC
  Administered 2023-06-14: 20 meq via ORAL
  Filled 2023-06-14: qty 1

## 2023-06-14 MED ORDER — METHOCARBAMOL 500 MG PO TABS
500.0000 mg | ORAL_TABLET | Freq: Three times a day (TID) | ORAL | Status: DC | PRN
  Administered 2023-06-15 (×3): 500 mg via ORAL
  Filled 2023-06-14 (×3): qty 1

## 2023-06-14 MED ORDER — AMLODIPINE BESYLATE 5 MG PO TABS
5.0000 mg | ORAL_TABLET | Freq: Every day | ORAL | Status: DC
  Administered 2023-06-14: 5 mg via ORAL
  Filled 2023-06-14 (×2): qty 1

## 2023-06-14 MED ORDER — ORAL CARE MOUTH RINSE: 15.0000 mL | OROMUCOSAL | Status: DC | PRN

## 2023-06-14 MED ORDER — ACETAMINOPHEN 500 MG PO TABS
1000.0000 mg | ORAL_TABLET | Freq: Three times a day (TID) | ORAL | Status: DC | PRN
  Administered 2023-06-14 – 2023-06-15 (×4): 1000 mg via ORAL
  Filled 2023-06-14 (×4): qty 2

## 2023-06-14 MED ORDER — POTASSIUM CHLORIDE 10 MEQ/100ML IV SOLN: 10.0000 meq | INTRAVENOUS | Status: DC

## 2023-06-14 NOTE — Plan of Care (Signed)
  Problem: Education: Goal: Knowledge of disease or condition will improve 06/14/2023 0855 by Barnett Hatter, RN Outcome: Progressing 06/14/2023 0804 by Barnett Hatter, RN Outcome: Progressing Goal: Knowledge of secondary prevention will improve (MUST DOCUMENT ALL) 06/14/2023 0855 by Barnett Hatter, RN Outcome: Progressing 06/14/2023 0804 by Barnett Hatter, RN Outcome: Progressing Goal: Knowledge of patient specific risk factors will improve Loraine Leriche N/A or DELETE if not current risk factor) Outcome: Progressing   Problem: Ischemic Stroke/TIA Tissue Perfusion: Goal: Complications of ischemic stroke/TIA will be minimized Outcome: Progressing   Problem: Coping: Goal: Will verbalize positive feelings about self Outcome: Progressing Goal: Will identify appropriate support needs Outcome: Progressing   Problem: Health Behavior/Discharge Planning: Goal: Ability to manage health-related needs will improve Outcome: Progressing Goal: Goals will be collaboratively established with patient/family Outcome: Progressing   Problem: Self-Care: Goal: Ability to participate in self-care as condition permits will improve Outcome: Progressing Goal: Verbalization of feelings and concerns over difficulty with self-care will improve Outcome: Progressing Goal: Ability to communicate needs accurately will improve Outcome: Progressing   Problem: Nutrition: Goal: Risk of aspiration will decrease Outcome: Progressing Goal: Dietary intake will improve Outcome: Progressing

## 2023-06-14 NOTE — Progress Notes (Addendum)
STROKE TEAM PROGRESS NOTE   BRIEF HPI [name] is a  62 year old woman with no significant past medical history because she has not seen a physician presented to the emergency department for evaluation of sudden onset of left-sided face arm and leg weakness along with decreased sensation on the left and some slurred speech. She was having dinner around 6:30 PM when she had sudden onset of left-sided numbness and weakness. CODE STROKE activated at [facility]. BP 225 systolic on arrival. Patient was reportedly also very anxious on arrival. NIH: 4. After BP controlled, TNK was given at 2007. MRI shows R basal ganglia infarct.   SIGNIFICANT HOSPITAL EVENTS 11/1: TNK given @ 2007  INTERIM HISTORY/SUBJECTIVE Daughter and son-in-law at bedside. RN at bedside.  K 3.4, replaced Patient c/o muscle spasms and pain, meds added PRN IV PRN and PO BP meds added.  24h post-TNK CT planned for 2000 11/2.   OBJECTIVE  CBC    Component Value Date/Time   WBC 9.5 06/13/2023 1923   RBC 4.55 06/13/2023 1923   HGB 13.6 06/13/2023 1935   HCT 40.0 06/13/2023 1935   PLT 299 06/13/2023 1923   MCV 87.3 06/13/2023 1923   MCH 27.7 06/13/2023 1923   MCHC 31.7 06/13/2023 1923   RDW 13.1 06/13/2023 1923   LYMPHSABS 3.2 06/13/2023 1923   MONOABS 0.6 06/13/2023 1923   EOSABS 0.2 06/13/2023 1923   BASOSABS 0.1 06/13/2023 1923    BMET    Component Value Date/Time   NA 141 06/13/2023 1935   K 3.4 (L) 06/13/2023 1935   CL 102 06/13/2023 1935   CO2 28 06/13/2023 1923   GLUCOSE 118 (H) 06/13/2023 1935   BUN 16 06/13/2023 1935   CREATININE 0.90 06/13/2023 1935   CALCIUM 9.6 06/13/2023 1923   GFRNONAA >60 06/13/2023 1923    IMAGING past 24 hours MR BRAIN WO CONTRAST  Result Date: 06/13/2023 CLINICAL DATA:  Follow-up examination for stroke. EXAM: MRI HEAD WITHOUT CONTRAST TECHNIQUE: Multiplanar, multiecho pulse sequences of the brain and surrounding structures were obtained without intravenous  contrast. COMPARISON:  Prior studies from earlier the same day. FINDINGS: Brain: Cerebral volume within normal limits. No significant cerebral white matter disease for age. Small remote lacunar infarct present at the right lentiform nucleus with associated chronic hemosiderin staining. 1.6 cm acute ischemic perforator type infarcts seen involving the posterior right basal ganglia (series 13, image 21). This is positioned immediately superior to the above mention chronic lacunar infarct. No other evidence for acute or subacute ischemia. Gray-white matter differentiation otherwise maintained. No other acute or chronic intracranial blood products. No mass lesion, midline shift or mass effect. No hydrocephalus or extra-axial fluid collection. Pituitary gland suprasellar region within normal limits. Vascular: Major intracranial vascular flow voids are maintained. Skull and upper cervical spine: Craniocervical junction within normal limits. Bone marrow signal intensity normal. No scalp soft tissue abnormality. Sinuses/Orbits: Globes and orbital soft tissues within normal limits. Paranasal sinuses are largely clear. No significant mastoid effusion. Other: None. IMPRESSION: 1. 1.6 cm acute ischemic nonhemorrhagic perforator type infarct involving the posterior right basal ganglia. 2. Small remote hemorrhagic lacunar infarct at the right lentiform nucleus. 3. Otherwise normal brain MRI for age. Electronically Signed   By: Rise Mu M.D.   On: 06/13/2023 23:07   CT Angio Head Neck W WO CM  Result Date: 06/13/2023 CLINICAL DATA:  Left-side feels heavy EXAM: CT ANGIOGRAPHY HEAD AND NECK WITH AND WITHOUT CONTRAST TECHNIQUE: Multidetector CT imaging of the head  and neck was performed using the standard protocol during bolus administration of intravenous contrast. Multiplanar CT image reconstructions and MIPs were obtained to evaluate the vascular anatomy. Carotid stenosis measurements (when applicable) are obtained  utilizing NASCET criteria, using the distal internal carotid diameter as the denominator. RADIATION DOSE REDUCTION: This exam was performed according to the departmental dose-optimization program which includes automated exposure control, adjustment of the mA and/or kV according to patient size and/or use of iterative reconstruction technique. COMPARISON:  No prior CTA available, correlation is made with CT head 06/13/2023 FINDINGS: CT HEAD FINDINGS For noncontrast findings, please see same day CT head. CTA NECK FINDINGS Aortic arch: Standard branching. Imaged portion shows no evidence of aneurysm or dissection. No significant stenosis of the major arch vessel origins. Right carotid system: No evidence of dissection, occlusion, or hemodynamically significant stenosis (greater than 50%). Beading of the mid right ICA (series 12, image 120 and series 11, image 144). Left carotid system: No evidence of dissection, occlusion, or hemodynamically significant stenosis (greater than 50%). Beading of the mid left ICA (series 13, image 134 and series 11, image 147). Vertebral arteries: No evidence of dissection, occlusion, or hemodynamically significant stenosis (greater than 50%). Beading at the bilateral V3 segments (series 11, images 144-148). Skeleton: No acute osseous abnormality. Degenerative changes in the cervical spine. Other neck: 4.3 cm heterogeneously enhancing mass in the right thyroid lobe. Otherwise negative. Upper chest: No focal pulmonary opacity or pleural effusion. Review of the MIP images confirms the above findings CTA HEAD FINDINGS Anterior circulation: Both internal carotid arteries are patent to the termini, without significant stenosis. A1 segments patent. Normal anterior communicating artery. Anterior cerebral arteries are patent to their distal aspects without significant stenosis. No M1 stenosis or occlusion. MCA branches perfused to their distal aspects without significant stenosis. Posterior  circulation: Vertebral arteries patent to the vertebrobasilar junction without significant stenosis. Posterior inferior cerebellar arteries patent proximally. Basilar patent to its distal aspect without significant stenosis. Superior cerebellar arteries patent proximally. Patent P1 segments. PCAs perfused to their distal aspects without significant stenosis. The left posterior communicating arteries patent. Venous sinuses: As permitted by contrast timing, patent. Anatomic variants: None significant. No evidence of aneurysm or vascular malformation. Review of the MIP images confirms the above findings IMPRESSION: 1. No intracranial large vessel occlusion or significant stenosis. 2. No hemodynamically significant stenosis in the neck. 3. Beading of the bilateral mid ICAs and bilateral V3 segments, which can be seen in the setting of fibromuscular dysplasia. 4. 4.3 cm heterogeneously enhancing mass in the right thyroid lobe. If this has not previously been evaluated, a non-emergent ultrasound of the thyroid is recommended. (Reference: J Am Coll Radiol. 2015 Feb;12(2): 143-50) Electronically Signed   By: Wiliam Ke M.D.   On: 06/13/2023 19:59   CT HEAD CODE STROKE WO CONTRAST  Result Date: 06/13/2023 CLINICAL DATA:  Code stroke.  Left-side feeling heavy EXAM: CT HEAD WITHOUT CONTRAST TECHNIQUE: Contiguous axial images were obtained from the base of the skull through the vertex without intravenous contrast. RADIATION DOSE REDUCTION: This exam was performed according to the departmental dose-optimization program which includes automated exposure control, adjustment of the mA and/or kV according to patient size and/or use of iterative reconstruction technique. COMPARISON:  None Available. FINDINGS: Brain: No evidence of acute infarction, hemorrhage, mass, mass effect, or midline shift. No hydrocephalus or extra-axial collection. Vascular: No hyperdense vessel. Skull: Negative for fracture or focal lesion.  Sinuses/Orbits: No acute finding. Other: The mastoid air cells are well  aerated. ASPECTS [facility] Stroke Program Early CT Score) - Ganglionic level infarction (caudate, lentiform nuclei, internal capsule, insula, M1-M3 cortex): 7 - Supraganglionic infarction (M4-M6 cortex): 3 Total score (0-10 with 10 being normal): 10 IMPRESSION: No acute intracranial process. ASPECTS is 10. Code stroke imaging results were communicated on 06/13/2023 at 7:50 pm to provider Dr. Wilford Corner via telephone, who verbally acknowledged these results. Electronically Signed   By: Wiliam Ke M.D.   On: 06/13/2023 19:51    Vitals:   06/14/23 0750 06/14/23 0800 06/14/23 0900 06/14/23 0948  BP:  (!) 167/91 (!) 199/84 (!) 204/82  Pulse:  68 71 64  Resp:  17 15 12   Temp: 97.6 F (36.4 C)     TempSrc: Oral     SpO2:  98% 98% 96%  Weight:      Height:         PHYSICAL EXAM General:  Alert, mildly obese middle-age Caucasian lady in no acute distress Psych:  Mood and affect appropriate for situation CV: Regular rate and rhythm on monitor Respiratory:  Regular, unlabored respirations on room air GI: Abdomen soft and nontender   NEURO:  Mental Status: AA&Ox3, patient is able to give clear and coherent history Speech/Language: speech is with slight dysarthria, no aphasia.  Naming, repetition, fluency, and comprehension intact.  Cranial Nerves:  II: PERRL. Visual fields full.  III, IV, VI: EOMI. No ptosis.  V: Decreased sensation to light tough on left face VII: Left facial droop VIII: hearing intact to voice. IX, X: Palate elevates symmetrically. Phonation is normal.  LK:GMWNUUVO shrug 5/5. XII: tongue is midline without fasciculations. Motor:  RUE and RLE 5/5  LUE 4+/5 mild drift LLE 4/5 mild drift Tone: is normal and bulk is normal Sensation- Decreased to light touch to left arm and leg.  Coordination: FTN, fine motor movements slower on left.  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 ASPECTS 10.    CTA head & neck  No LVO or significant stenosis Beading of bilateral mid ICAs, often seen in fibromuscular dysplasia  MRI   1.6cm acute ischemic non-hemorrhagic posterior right basal ganglia infarct Small remote hemorrhagic lacunar infarct right lentiform nucleus  2D Echo: PENDING READ Carotid US: PENDING TCD w/Bubble Study: PENDING [place] LE DVT: PENDING ANA labs: PENDING LDL 111 HgbA1c 5.6  VTE prophylaxis - SCDs If no hemorrhage on 24h post-TNK CT, will add lovenox.  No antithrombotic prior to admission, now on No antithrombotic pending 24h post-TNK CT.  If no hemorrhage, will start patient on aspirin and plavix for 3 weeks, followed by aspirin monotherapy.  Therapy recommendations:  Pending Disposition:  pending  Hypertension Home meds:  none Cleviprex gtt, wean as tolerated Amlodipine 5mg  added Hydralazine 20mg  IV Q6H PRN added Unstable Blood Pressure Goal: BP less than 180/105   Hyperlipidemia Home meds:  none LDL 111, goal < 70 Add Crestor 20mg   Continue statin at discharge  Diabetes type II , no history Home meds:  none HgbA1c 5.6, goal < 7.0 CBGs SSI Recommend close follow-up with PCP  Tobacco Abuse Current cigarette smoker       Ready to quit? Yes Nicotine replacement therapy ordered and cessation education provided  Other Stroke Risk Factors Obesity, Body mass index is 38.6 kg/m.,  BMI >/= 30 associated with increased stroke risk, recommend weight loss, diet and exercise as appropriate  ?Obstructive sleep apnea discussed SleepSmart trial with patient, pending decision  Other Active Problems Hypokalemia 3.4, replaced Poor Medical Management/Does not visit doctor regularly TOC consult placed for  assistance with finding a PCP C/o Muscle Spasms and pain Extra-strength tylenol added PRN Robaxin 500 TID added PRN Anxiety Xanax 0.25 BID added PRN  Hospital day # 1   Pt seen by Neuro NP/APP and later by MD. Note/plan to be edited by MD as needed.    Lynnae January, DNP, AGACNP-BC [facility] Please use AMION for contact information & EPIC for messaging.  STROKE MD NOTE :  I have personally obtained history,examined this patient, reviewed notes, independently viewed imaging studies, participated in medical decision making and plan of care.ROS completed by me personally and pertinent positives fully documented  I have made any additions or clarifications directly to the above note. Agree with note above.  Patient presented with sudden onset left hemiparesis due to right basal ganglia infarct and received IV TNK with only partial improvement so far.  Continue close neurological observation and strict blood pressure control as per post TNK protocol.  Continue ongoing stroke workup and aggressive risk factor modification.  Check ANA panel, anticardiolipin antibodies, lower extremity Dopplers for DVT, TCD bubble study for PFO and carotid ultrasound.  And 2D echo.  Continue cardiac monitoring.  Mobilize out of bed.  Therapy consults.   Patient also appears to be at risk for obstructive sleep apnea and may benefit with consideration for possible participation in the sleep smart stroke prevention study.  I have reviewed information about the study and given written information to review to the patient and husband and daughters and answered their questions.  It was made clear to the patient and family that study palpation voluntary and they can withdraw participation at any point will be given the same excellent medical care irrespective of whether to participate in the study or not. This patient is critically ill and at significant risk of neurological worsening, death and care requires  constant monitoring of vital signs, hemodynamics,respiratory and cardiac monitoring, extensive review of multiple databases, frequent neurological assessment, discussion with family, other specialists and medical decision making of high complexity.I have made any additions or clarifications directly to the above note.This critical care time does not reflect procedure time, or teaching time or supervisory time of PA/NP/Med Resident etc but could involve care discussion time.  I spent 30 minutes of neurocritical care time  in the care of  this patient.     Delia Heady, MD Medical Director [facility] Pager: [phone] 06/14/2023 1:29 PM    To contact Stroke Continuity provider, please refer to [email or web address]. After hours, contact General Neurology

## 2023-06-14 NOTE — Evaluation (Signed)
Physical Therapy Evaluation Patient Details Name: Rebekah Mendez MRN: [id number] DOB: November 21, 1960 Today's Date: 06/14/2023  History of Present Illness  Patient is 62 y.o. female who presented to ED on 06/13/23 w/ sudden onset of Lt sided weakness/numbness. Code Stoke activated, TNK administered at [facility] and transferred to [facility]. 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 Small remote hemorrhagic lacunar infarct at the right lentiform nucleus. PMH significant for decreased thyroid activity.   Clinical Impression  Rebekah Mendez is 62 y.o. female admitted with above HPI and diagnosis. Patient is currently limited by functional impairments below (see PT problem list). Patient lives with spouse and is independent at baseline. She is limited by Lt sided weakness, impaired coordination, and cognitive impairments limited motor planning. She was able to complete bed mobility with min assist and transferred bed>chair with Mod+2 HHA. Pt required manual facilitation of weight shift to initiate steps. Patient will benefit from continued skilled PT interventions to address impairments and progress independence with mobility. Patient will benefit from intensive inpatient follow up therapy, >3 hours/day. Acute PT will follow and progress as able.        If plan is discharge home, recommend the following:     Can travel by private vehicle        Equipment Recommendations  (defer to next venue)  Recommendations for Other Services  Rehab consult    Functional Status Assessment Patient has had a recent decline in their functional status and demonstrates the ability to make significant improvements in function in a reasonable and predictable amount of time.     Precautions / Restrictions Precautions Precautions: Fall Precaution Comments: BP <180/105 Restrictions Weight Bearing Restrictions: No      Mobility  Bed Mobility Overal  bed mobility: Needs Assistance Bed Mobility: Supine to Sit     Supine to sit: Min assist, HOB elevated, Used rails     General bed mobility comments: cues for seqeuncing walking LE's off EOB and use of bed rail to raise trunk. Min assist to sit upright and steady balance at EOB.    Transfers Overall transfer level: Needs assistance   Transfers: Sit to/from Stand, Bed to chair/wheelchair/BSC Sit to Stand: Mod assist, +2 physical assistance, +2 safety/equipment   Step pivot transfers: Mod assist, +2 physical assistance, +2 safety/equipment       General transfer comment: Mod +2 to initiate and complete power up from EOB, bil LE blocked. Manual facilitation of weight shift Rt/Lt for stepping to move bed>chair.    Ambulation/Gait                  Stairs            Wheelchair Mobility     Tilt Bed    Modified Rankin (Stroke Patients Only)       Balance Overall balance assessment: Needs assistance Sitting-balance support: Feet supported, Bilateral upper extremity supported Sitting balance-Leahy Scale: Fair     Standing balance support: During functional activity, Bilateral upper extremity supported Standing balance-Leahy Scale: Poor                               Pertinent Vitals/Pain Pain Assessment Pain Assessment: No/denies pain    Home Living Family/patient expects to be discharged to:: Private residence Living Arrangements: Spouse/significant other Available Help at Discharge: Family Type of Home: House Home Access: Stairs to enter Entrance Stairs-Rails: None Entrance Stairs-Number of  Steps: 1   Home Layout: One level (2 small step between a couple rooms (frame/bar to hold))        Prior Function Prior Level of Function : Independent/Modified Independent;Driving;Working/employed                     Extremity/Trunk Assessment   Upper Extremity Assessment Upper Extremity Assessment: Defer to OT evaluation    Lower  Extremity Assessment Lower Extremity Assessment: RLE deficits/detail;LLE deficits/detail RLE details: heel to shin intact, grossly 4/5 for MMT  LLE Sensation: decreased light touch;decreased proprioception LLE Coordination: decreased gross motor;decreased fine motor LLE details: heel to shin slow and decreased acciracy, grossly 3+/5 to 4-/5 with MMT   Cervical / Trunk Assessment Cervical / Trunk Assessment: Normal  Communication   Communication Communication: No apparent difficulties Cueing Techniques: Verbal cues;Tactile cues  Cognition Arousal: Alert Behavior During Therapy: WFL for tasks assessed/performed Overall Cognitive Status: Impaired/Different from baseline Area of Impairment: Following commands, Safety/judgement, Awareness, Problem solving                       Following Commands: Follows one step commands consistently, Follows one step commands with increased time, Follows multi-step commands inconsistently Safety/Judgement: Decreased awareness of deficits, Decreased awareness of safety Awareness: Emergent Problem Solving: Slow processing, Decreased initiation, Difficulty sequencing, Requires verbal cues, Requires tactile cues          General Comments      Exercises     Assessment/Plan    PT Assessment Patient needs continued PT services  PT Problem List Decreased strength;Decreased range of motion;Decreased activity tolerance;Decreased balance;Decreased mobility;Decreased coordination;Decreased cognition;Decreased knowledge of use of DME;Decreased safety awareness;Decreased knowledge of precautions       PT Treatment Interventions DME instruction;Gait training;Stair training;Functional mobility training;Therapeutic activities;Therapeutic exercise;Balance training;Neuromuscular re-education;Patient/family education    PT Goals (Current goals can be found in the Care Plan section)  Acute Rehab PT Goals Patient Stated Goal: regain independence PT Goal  Formulation: With patient Time For Goal Achievement: 06/28/23 Potential to Achieve Goals: Good    Frequency Min 1X/week     Co-evaluation               AM-PAC PT "6 Clicks" Mobility  Outcome Measure Help needed turning from your back to your side while in a flat bed without using bedrails?: A Little Help needed moving from lying on your back to sitting on the side of a flat bed without using bedrails?: A Little Help needed moving to and from a bed to a chair (including a wheelchair)?: A Lot Help needed standing up from a chair using your arms (e.g., wheelchair or bedside chair)?: A Lot Help needed to walk in hospital room?: Total Help needed climbing 3-5 steps with a railing? : Total 6 Click Score: 12    End of Session Equipment Utilized During Treatment: Gait belt Activity Tolerance: Patient tolerated treatment well Patient left: in chair;with call bell/phone within reach;with chair alarm set   PT Visit Diagnosis: Muscle weakness (generalized) (M62.81);Difficulty in walking, not elsewhere classified (R26.2);Other abnormalities of gait and mobility (R26.89);Other symptoms and signs involving the nervous system (R29.898)    Time: 1610-9604 PT Time Calculation (min) (ACUTE ONLY): 34 min   Charges:   PT Evaluation $PT Eval Moderate Complexity: 1 Mod PT Treatments $Therapeutic Activity: 8-22 mins PT General Charges $$ ACUTE PT VISIT: 1 Visit         Wynn Maudlin, DPT Acute Rehabilitation Services Office [phone]  06/14/23 5:39 PM

## 2023-06-14 NOTE — Progress Notes (Signed)
VASCULAR LAB    Bilateral lower extremity venous duplex has been performed.  See CV proc for preliminary results.   Deja Pisarski, RVT 06/14/2023, 2:25 PM

## 2023-06-14 NOTE — Progress Notes (Signed)
  Echocardiogram 2D Echocardiogram has been performed.  Rebekah Mendez 06/14/2023, 3:52 PM

## 2023-06-14 NOTE — Progress Notes (Signed)
VASCULAR LAB    Carotid duplex has been performed.  See CV proc for preliminary results.   Cruze Zingaro, RVT 06/14/2023, 2:26 PM

## 2023-06-14 NOTE — H&P (Signed)
Admission H&P    Chief Complaint: Acute onset of left sided weakness.   HPI: Rebekah Mendez is an 62 y.o. female with no significant past medical history in the setting of not having recently seen a physician, who presented to the emergency department for evaluation of sudden onset of left-sided face, arm and leg weakness along with decreased sensation on the left and some slurred speech. She was having dinner around 6:30 PM on Friday evening when she had sudden onset of left-sided numbness and weakness. She was brought in to the hospital via private vehicle by family and a code stroke was activated at [facility]. She was evaluated by Teleneurology. Her blood pressures were systolic 225 on arrival.  She was also very anxious. Her symptoms continued to be present and exam by Teleneurology confirmed left sided deficits with an NIHSS score of 4. She denied any chest pain, shortness of breath, nausea or vomiting. She denied being on any blood thinners or having any recent surgeries. TNK was administered after obtaining informed consent and she was transferred to [facility] for further management.   Past Medical History:  Diagnosis Date   Thyroid activity decreased    late 20's    Past Surgical History:  Procedure Laterality Date   CESAREAN SECTION     Feb '89   EXCISIONAL HEMORRHOIDECTOMY      Family History  Problem Relation Age of Onset   Colon cancer Neg Hx    Social History:  reports that she has been smoking. She has never used smokeless tobacco. She reports current alcohol use. She reports that she does not use drugs.  Allergies: No Known Allergies  No medications prior to admission.    ROS: As per HPI. She continues to have left sided weakness and dysarthria on follow up exam after arriving to the 4N ICU at [facility].   Physical Examination: Blood pressure (!) 142/70, pulse 73, temperature 98.2 F (36.8 C), temperature source Oral, resp. rate 14, height 5' 4.5" (1.638 m), weight  103.6 kg, SpO2 96%.  Physical Exam  HEENT-  [place]/AT    Lungs- Respirations unlabored Extremities- No edema  Neurological Examination Mental Status: Alert, oriented x 5, thought content appropriate.  Speech mildly dysarthric but fluent with intact naming and comprehension.  Able to follow all commands without difficulty. Cranial Nerves: II: Temporal visual fields intact with no extinction to DSS. PERRL  III,IV, VI: No ptosis. EOMI but with mild saccadic quality of her visual pursuits.  V: Temp sensation decreased on the left.   VII: Left facial droop.  VIII: Hearing intact to voice IX,X: No hoarseness XI: Head is midline.  XII: Midline tongue extension Motor: RUE and RLE 5/5 proximally and distally LUE 4/5 LLE 4/5 Prominent left pronator drift and LUE incoordination.  Sensory: Decreased temp sensation to LUE. Normal temp sensation to RUE and BLE. No extinction to DSS.   Deep Tendon Reflexes: 2+ and symmetric bilateral patellar and brachioradialis reflexes. Right toe downgoing, left toe upgoing.  Cerebellar: No ataxia with FNF on the right. Prominent dysmetria on the left.  Gait: Deferred  Results for orders placed or performed during the hospital encounter of 06/13/23 (from the past 48 hour(s))  Urine rapid drug screen (hosp performed)     Status: None   Collection Time: 06/13/23  7:12 PM  Result Value Ref Range   Opiates NONE DETECTED NONE DETECTED   Cocaine NONE DETECTED NONE DETECTED   Benzodiazepines NONE DETECTED NONE DETECTED   Amphetamines NONE  DETECTED NONE DETECTED   Tetrahydrocannabinol NONE DETECTED NONE DETECTED   Barbiturates NONE DETECTED NONE DETECTED    Comment: (NOTE) DRUG SCREEN FOR MEDICAL PURPOSES ONLY.  IF CONFIRMATION IS NEEDED FOR ANY PURPOSE, NOTIFY LAB WITHIN 5 DAYS.  LOWEST DETECTABLE LIMITS FOR URINE DRUG SCREEN Drug Class                     Cutoff (ng/mL) Amphetamine and metabolites    1000 Barbiturate and metabolites    200 Benzodiazepine                  200 Opiates and metabolites        300 Cocaine and metabolites        300 THC                            50 Performed at [facility], [address], [place], [place] [address]   Urinalysis, Routine w reflex microscopic -Urine, Clean Catch     Status: Abnormal   Collection Time: 06/13/23  7:12 PM  Result Value Ref Range   Color, Urine STRAW (A) YELLOW   APPearance CLEAR CLEAR   Specific Gravity, Urine 1.027 1.005 - 1.030   pH 6.0 5.0 - 8.0   Glucose, UA NEGATIVE NEGATIVE mg/dL   Hgb urine dipstick SMALL (A) NEGATIVE   Bilirubin Urine NEGATIVE NEGATIVE   Ketones, ur NEGATIVE NEGATIVE mg/dL   Protein, ur NEGATIVE NEGATIVE mg/dL   Nitrite NEGATIVE NEGATIVE   Leukocytes,Ua NEGATIVE NEGATIVE   RBC / HPF 0-5 0 - 5 RBC/hpf   WBC, UA 0-5 0 - 5 WBC/hpf   Bacteria, UA RARE (A) NONE SEEN   Squamous Epithelial / HPF 0-5 0 - 5 /HPF   Hyaline Casts, UA PRESENT     Comment: Performed at [facility], [address], [place], [place] [address]  Protime-INR     Status: None   Collection Time: 06/13/23  7:23 PM  Result Value Ref Range   Prothrombin Time 12.5 11.4 - 15.2 seconds   INR 0.9 0.8 - 1.2    Comment: (NOTE) INR goal varies based on device and disease states. Performed at [facility], [address], [place], [place] [address]   APTT     Status: None   Collection Time: 06/13/23  7:23 PM  Result Value Ref Range   aPTT 25 24 - 36 seconds    Comment: Performed at [facility], [address] [place], [place] [address]  CBC     Status: None   Collection Time: 06/13/23  7:23 PM  Result Value Ref Range   WBC 9.5 4.0 - 10.5 K/uL   RBC 4.55 3.87 - 5.11 MIL/uL   Hemoglobin 12.6 12.0 - 15.0 g/dL   HCT 30.8 65.7 - 84.6 %   MCV 87.3 80.0 - 100.0 fL   MCH 27.7 26.0 - 34.0 pg   MCHC 31.7 30.0 - 36.0 g/dL   RDW 96.2 95.2 - 84.1 %   Platelets 299 150 - 400 K/uL   nRBC 0.0 0.0 - 0.2 %    Comment:  Performed at [facility], [address], [place], [place] [address]  Differential     Status: None   Collection Time: 06/13/23  7:23 PM  Result Value Ref Range   Neutrophils Relative % 58 %   Neutro Abs 5.5 1.7 - 7.7  K/uL   Lymphocytes Relative 33 %   Lymphs Abs 3.2 0.7 - 4.0 K/uL   Monocytes Relative 6 %   Monocytes Absolute 0.6 0.1 - 1.0 K/uL   Eosinophils Relative 2 %   Eosinophils Absolute 0.2 0.0 - 0.5 K/uL   Basophils Relative 1 %   Basophils Absolute 0.1 0.0 - 0.1 K/uL   Immature Granulocytes 0 %   Abs Immature Granulocytes 0.02 0.00 - 0.07 K/uL    Comment: Performed at [facility], [address], [place], [place] [address]  Comprehensive metabolic panel     Status: Abnormal   Collection Time: 06/13/23  7:23 PM  Result Value Ref Range   Sodium 136 135 - 145 mmol/L   Potassium 3.1 (L) 3.5 - 5.1 mmol/L   Chloride 97 (L) 98 - 111 mmol/L   CO2 28 22 - 32 mmol/L   Glucose, Bld 124 (H) 70 - 99 mg/dL    Comment: Glucose reference range applies only to samples taken after fasting for at least 8 hours.   BUN 16 6 - 20 mg/dL   Creatinine, Ser 9.14 0.44 - 1.00 mg/dL   Calcium 9.6 8.9 - 78.2 mg/dL   Total Protein 7.4 6.5 - 8.1 g/dL   Albumin 4.1 3.5 - 5.0 g/dL   AST 19 15 - 41 U/L   ALT 20 0 - 44 U/L   Alkaline Phosphatase 71 38 - 126 U/L   Total Bilirubin 0.2 (L) 0.3 - 1.2 mg/dL   GFR, Estimated >95 >62 mL/min    Comment: (NOTE) Calculated using the CKD-EPI Creatinine Equation (2021)    Anion gap 11 5 - 15    Comment: Performed at [facility], [address] [place], [place] [address]  hCG, serum, qualitative     Status: None   Collection Time: 06/13/23  7:23 PM  Result Value Ref Range   Preg, Serum NEGATIVE NEGATIVE    Comment:        THE SENSITIVITY OF THIS METHODOLOGY IS >10 mIU/mL. Performed at [facility], [address], [place], [place] [address]   Ethanol     Status: None    Collection Time: 06/13/23  7:28 PM  Result Value Ref Range   Alcohol, Ethyl (B) <10 <10 mg/dL    Comment: (NOTE) Lowest detectable limit for serum alcohol is 10 mg/dL.  For medical purposes only. Performed at [facility], [address] [place], [place] [address]   CBG monitoring, ED     Status: Abnormal   Collection Time: 06/13/23  7:30 PM  Result Value Ref Range   Glucose-Capillary 132 (H) 70 - 99 mg/dL    Comment: Glucose reference range applies only to samples taken after fasting for at least 8 hours.  I-stat chem 8, ED     Status: Abnormal   Collection Time: 06/13/23  7:35 PM  Result Value Ref Range   Sodium 141 135 - 145 mmol/L   Potassium 3.4 (L) 3.5 - 5.1 mmol/L   Chloride 102 98 - 111 mmol/L   BUN 16 6 - 20 mg/dL   Creatinine, Ser 2.84 0.44 - 1.00 mg/dL   Glucose, Bld 132 (H) 70 - 99 mg/dL    Comment: Glucose reference range applies only to samples taken after fasting for at least 8 hours.   Calcium, Ion 1.20 1.15 - 1.40 mmol/L   TCO2 27 22 - 32 mmol/L   Hemoglobin 13.6 12.0 - 15.0 g/dL  HCT 40.0 36.0 - 46.0 %  MRSA Next Gen by PCR, Nasal     Status: None   Collection Time: 06/14/23 12:07 AM   Specimen: Nasal Mucosa; Nasal Swab  Result Value Ref Range   MRSA by PCR Next Gen NOT DETECTED NOT DETECTED    Comment: (NOTE) The GeneXpert MRSA Assay (FDA approved for NASAL specimens only), is one component of a comprehensive MRSA colonization surveillance program. It is not intended to diagnose MRSA infection nor to guide or monitor treatment for MRSA infections. Test performance is not FDA approved in patients less than 46 years old. Performed at [facility], [address] [place], [place] [address]    MR BRAIN WO CONTRAST  Result Date: 06/13/2023 CLINICAL DATA:  Follow-up examination for stroke. EXAM: MRI HEAD WITHOUT CONTRAST TECHNIQUE: Multiplanar, multiecho pulse sequences of the brain and surrounding structures were obtained without  intravenous contrast. COMPARISON:  Prior studies from earlier the same day. FINDINGS: Brain: Cerebral volume within normal limits. No significant cerebral white matter disease for age. Small remote lacunar infarct present at the right lentiform nucleus with associated chronic hemosiderin staining. 1.6 cm acute ischemic perforator type infarcts seen involving the posterior right basal ganglia (series 13, image 21). This is positioned immediately superior to the above mention chronic lacunar infarct. No other evidence for acute or subacute ischemia. Gray-white matter differentiation otherwise maintained. No other acute or chronic intracranial blood products. No mass lesion, midline shift or mass effect. No hydrocephalus or extra-axial fluid collection. Pituitary gland suprasellar region within normal limits. Vascular: Major intracranial vascular flow voids are maintained. Skull and upper cervical spine: Craniocervical junction within normal limits. Bone marrow signal intensity normal. No scalp soft tissue abnormality. Sinuses/Orbits: Globes and orbital soft tissues within normal limits. Paranasal sinuses are largely clear. No significant mastoid effusion. Other: None. IMPRESSION: 1. 1.6 cm acute ischemic nonhemorrhagic perforator type infarct involving the posterior right basal ganglia. 2. Small remote hemorrhagic lacunar infarct at the right lentiform nucleus. 3. Otherwise normal brain MRI for age. Electronically Signed   By: Rise Mu M.D.   On: 06/13/2023 23:07   CT Angio Head Neck W WO CM  Result Date: 06/13/2023 CLINICAL DATA:  Left-side feels heavy EXAM: CT ANGIOGRAPHY HEAD AND NECK WITH AND WITHOUT CONTRAST TECHNIQUE: Multidetector CT imaging of the head and neck was performed using the standard protocol during bolus administration of intravenous contrast. Multiplanar CT image reconstructions and MIPs were obtained to evaluate the vascular anatomy. Carotid stenosis measurements (when applicable)  are obtained utilizing NASCET criteria, using the distal internal carotid diameter as the denominator. RADIATION DOSE REDUCTION: This exam was performed according to the departmental dose-optimization program which includes automated exposure control, adjustment of the mA and/or kV according to patient size and/or use of iterative reconstruction technique. COMPARISON:  No prior CTA available, correlation is made with CT head 06/13/2023 FINDINGS: CT HEAD FINDINGS For noncontrast findings, please see same day CT head. CTA NECK FINDINGS Aortic arch: Standard branching. Imaged portion shows no evidence of aneurysm or dissection. No significant stenosis of the major arch vessel origins. Right carotid system: No evidence of dissection, occlusion, or hemodynamically significant stenosis (greater than 50%). Beading of the mid right ICA (series 12, image 120 and series 11, image 144). Left carotid system: No evidence of dissection, occlusion, or hemodynamically significant stenosis (greater than 50%). Beading of the mid left ICA (series 13, image 134 and series 11, image 147). Vertebral arteries: No evidence of dissection, occlusion, or  hemodynamically significant stenosis (greater than 50%). Beading at the bilateral V3 segments (series 11, images 144-148). Skeleton: No acute osseous abnormality. Degenerative changes in the cervical spine. Other neck: 4.3 cm heterogeneously enhancing mass in the right thyroid lobe. Otherwise negative. Upper chest: No focal pulmonary opacity or pleural effusion. Review of the MIP images confirms the above findings CTA HEAD FINDINGS Anterior circulation: Both internal carotid arteries are patent to the termini, without significant stenosis. A1 segments patent. Normal anterior communicating artery. Anterior cerebral arteries are patent to their distal aspects without significant stenosis. No M1 stenosis or occlusion. MCA branches perfused to their distal aspects without significant stenosis.  Posterior circulation: Vertebral arteries patent to the vertebrobasilar junction without significant stenosis. Posterior inferior cerebellar arteries patent proximally. Basilar patent to its distal aspect without significant stenosis. Superior cerebellar arteries patent proximally. Patent P1 segments. PCAs perfused to their distal aspects without significant stenosis. The left posterior communicating arteries patent. Venous sinuses: As permitted by contrast timing, patent. Anatomic variants: None significant. No evidence of aneurysm or vascular malformation. Review of the MIP images confirms the above findings IMPRESSION: 1. No intracranial large vessel occlusion or significant stenosis. 2. No hemodynamically significant stenosis in the neck. 3. Beading of the bilateral mid ICAs and bilateral V3 segments, which can be seen in the setting of fibromuscular dysplasia. 4. 4.3 cm heterogeneously enhancing mass in the right thyroid lobe. If this has not previously been evaluated, a non-emergent ultrasound of the thyroid is recommended. (Reference: J Am Coll Radiol. 2015 Feb;12(2): 143-50) Electronically Signed   By: Wiliam Ke M.D.   On: 06/13/2023 19:59   CT HEAD CODE STROKE WO CONTRAST  Result Date: 06/13/2023 CLINICAL DATA:  Code stroke.  Left-side feeling heavy EXAM: CT HEAD WITHOUT CONTRAST TECHNIQUE: Contiguous axial images were obtained from the base of the skull through the vertex without intravenous contrast. RADIATION DOSE REDUCTION: This exam was performed according to the departmental dose-optimization program which includes automated exposure control, adjustment of the mA and/or kV according to patient size and/or use of iterative reconstruction technique. COMPARISON:  None Available. FINDINGS: Brain: No evidence of acute infarction, hemorrhage, mass, mass effect, or midline shift. No hydrocephalus or extra-axial collection. Vascular: No hyperdense vessel. Skull: Negative for fracture or focal lesion.  Sinuses/Orbits: No acute finding. Other: The mastoid air cells are well aerated. ASPECTS [facility] Stroke Program Early CT Score) - Ganglionic level infarction (caudate, lentiform nuclei, internal capsule, insula, M1-M3 cortex): 7 - Supraganglionic infarction (M4-M6 cortex): 3 Total score (0-10 with 10 being normal): 10 IMPRESSION: No acute intracranial process. ASPECTS is 10. Code stroke imaging results were communicated on 06/13/2023 at 7:50 pm to provider Dr. Wilford Corner via telephone, who verbally acknowledged these results. Electronically Signed   By: Wiliam Ke M.D.   On: 06/13/2023 19:51     Assessment: 62 year old female who presented with stroke-like symptoms. She was administered TNK at the [facility] ED and has been transferred to [facility] for post-TNK management and stroke work up.  - Exam reveals left sided motor and sensory deficits in addition to dysarthria and LUE dysmetria.  - CT head: No acute intracranial process. ASPECTS is 10.  - CTA of head and neck: No intracranial large vessel occlusion or significant stenosis. No hemodynamically significant stenosis in the neck. Beading of the bilateral mid ICAs and bilateral V3 segments, which can be seen in the setting of fibromuscular dysplasia.  - MRI brain: 1.6 cm acute ischemic nonhemorrhagic perforator type infarct involving the posterior  right basal ganglia. Small remote hemorrhagic lacunar infarct at the right lentiform nucleus. Otherwise normal brain MRI for age. - Labs:  - UA negative - UDS negative - Coags are normal.  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 Cardioembolic mechanism is also possible but felt to be lower on the DDx.    Plan: - Frequent neurochecks per the post TNK protocol. - Frequent vital checks per the ICU/TNK protocol. - No antiplatelets or anticoagulants x 24 hours - 24-hour MRI at 8 PM on 06/14/2023. - If the above imaging is negative  for any evidence of bleed, can start antiplatelets at that time-will defer to the stroke team rounding. - 2D echo - A1c - Lipid panel - PT, OT and speech therapy assessments - Blood pressure goal strictly below 180/105.  Cleviprex drip has been ordered to maintain the blood pressure below goal. - Stroke Team to follow in the morning.    Addendum: Also noted on CT head is a 4.3 cm heterogeneously enhancing mass in the right thyroid lobe. If this has not previously been evaluated, a non-emergent ultrasound of the thyroid is recommended.   Electronically signed: Dr. Caryl Pina 06/14/2023, 2:31 AM

## 2023-06-15 ENCOUNTER — Inpatient Hospital Stay (HOSPITAL_COMMUNITY): Payer: Self-pay

## 2023-06-15 DIAGNOSIS — I639 Cerebral infarction, unspecified: Secondary | ICD-10-CM

## 2023-06-15 DIAGNOSIS — I6300 Cerebral infarction due to thrombosis of unspecified precerebral artery: Secondary | ICD-10-CM

## 2023-06-15 MED ORDER — ENOXAPARIN SODIUM 40 MG/0.4ML IJ SOSY
40.0000 mg | PREFILLED_SYRINGE | INTRAMUSCULAR | Status: DC
  Administered 2023-06-15 – 2023-06-16 (×2): 40 mg via SUBCUTANEOUS
  Filled 2023-06-15 (×2): qty 0.4

## 2023-06-15 MED ORDER — ASPIRIN 81 MG PO TBEC
81.0000 mg | DELAYED_RELEASE_TABLET | Freq: Every day | ORAL | Status: DC
  Administered 2023-06-15 – 2023-06-17 (×3): 81 mg via ORAL
  Filled 2023-06-15 (×3): qty 1

## 2023-06-15 MED ORDER — CLOPIDOGREL BISULFATE 75 MG PO TABS
75.0000 mg | ORAL_TABLET | Freq: Every day | ORAL | Status: DC
  Administered 2023-06-15 – 2023-06-17 (×3): 75 mg via ORAL
  Filled 2023-06-15 (×3): qty 1

## 2023-06-15 MED ORDER — ENOXAPARIN SODIUM 30 MG/0.3ML IJ SOSY: 30.0000 mg | PREFILLED_SYRINGE | INTRAMUSCULAR | Status: DC

## 2023-06-15 MED ORDER — AMLODIPINE BESYLATE 10 MG PO TABS
10.0000 mg | ORAL_TABLET | Freq: Every day | ORAL | Status: DC
  Administered 2023-06-15 – 2023-06-17 (×3): 10 mg via ORAL
  Filled 2023-06-15 (×2): qty 1

## 2023-06-15 NOTE — Evaluation (Signed)
Speech Language Pathology Evaluation Patient Details Name: Rebekah Mendez MRN: [id number] DOB: [birth year] Today's Date: 06/15/2023 Time: 6295-2841 SLP Time Calculation (min) (ACUTE ONLY): 19 min  Problem List:  Patient Active Problem List   Diagnosis Date Noted   Stroke (cerebrum) (HCC) 06/13/2023   Past Medical History:  Past Medical History:  Diagnosis Date   Thyroid activity decreased    late 20's   Past Surgical History:  Past Surgical History:  Procedure Laterality Date   CESAREAN SECTION     Feb '89   EXCISIONAL HEMORRHOIDECTOMY     HPI:  Patient is 62 y.o. female who presented to ED on 06/13/23 w/ sudden onset of Lt sided weakness/numbness. Code [place] activated, TNK administered at [facility] and transferred to [facility]. 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 Small remote hemorrhagic lacunar infarct at the right lentiform nucleus. PMH significant for decreased thyroid activity.   Assessment / Plan / Recommendation Clinical Impression  Pt scored 29/30 on the SLUMS (27 and above considered to be WNL). She showed appropriate self-monitoring in that upon completion of testing, she knew which question she had gotten wrong and asked to hear it again. That time, she did answer correctly. Her responses throughout testing were swift and she alternated attention well between testing questions and watching the Panthers game on TV. She does endorse mild changes to her speech, and although a mild dysarthria is present, she is intelligible at the conversational level. Pt and visitors all believe that she is at her cognitive baseline. Recommend considering SLP f/u at next level of care if speech imprecision persits.    SLP Assessment  SLP Recommendation/Assessment: All further Speech Lanaguage Pathology  needs can be addressed in the next venue of care SLP Visit Diagnosis: Dysarthria and anarthria (R47.1)     Recommendations for follow up therapy are one component of a multi-disciplinary discharge planning process, led by the attending physician.  Recommendations may be updated based on patient status, additional functional criteria and insurance authorization.    Follow Up Recommendations  Acute inpatient rehab (3hours/day)    Assistance Recommended at Discharge  Intermittent Supervision/Assistance  Functional Status Assessment Patient has had a recent decline in their functional status and demonstrates the ability to make significant improvements in function in a reasonable and predictable amount of time.  Frequency and Duration           SLP Evaluation Cognition  Overall Cognitive Status: Within Functional Limits for tasks assessed (Simultaneous filing. User may not have seen previous data.) Orientation Level: Oriented X4       Comprehension  Auditory Comprehension Overall Auditory Comprehension: Appears within functional limits for tasks assessed    Expression Expression Primary Mode of Expression: Verbal Verbal Expression Overall Verbal Expression: Appears within functional limits for tasks assessed   Oral / Motor  Motor Speech Overall Motor Speech: Impaired Respiration: Within functional limits Phonation: Normal Resonance: Within functional limits Articulation: Impaired Level of Impairment: Conversation Intelligibility: Intelligible Motor Planning: Witnin functional limits Motor Speech Errors: Not applicable             Mahala Menghini., M.A. CCC-SLP Acute Rehabilitation Services Office [phone]  Secure chat preferred  06/15/2023, 2:32 PM

## 2023-06-15 NOTE — Plan of Care (Signed)
  Problem: Education: Goal: Knowledge of disease or condition will improve Outcome: Progressing Goal: Knowledge of secondary prevention will improve (MUST DOCUMENT ALL) Outcome: Progressing Goal: Knowledge of patient specific risk factors will improve [place]ta [place] N/A or DELETE if not current risk factor) Outcome: Progressing   Problem: Ischemic Stroke/TIA Tissue Perfusion: Goal: Complications of ischemic stroke/TIA will be minimized Outcome: Progressing   Problem: Coping: Goal: Will verbalize positive feelings about self Outcome: Progressing Goal: Will identify appropriate support needs Outcome: Progressing   Problem: Health Behavior/Discharge Planning: Goal: Ability to manage health-related needs will improve Outcome: Progressing Goal: Goals will be collaboratively established with patient/family Outcome: Progressing   Problem: Self-Care: Goal: Ability to participate in self-care as condition permits will improve Outcome: Progressing Goal: Verbalization of feelings and concerns over difficulty with self-care will improve Outcome: Progressing Goal: Ability to communicate needs accurately will improve Outcome: Progressing   Problem: Nutrition: Goal: Risk of aspiration will decrease Outcome: Progressing Goal: Dietary intake will improve Outcome: Progressing

## 2023-06-15 NOTE — PMR Pre-admission (Signed)
PMR Admission Coordinator Pre-Admission Assessment  Patient: Rebekah Mendez is an 62 y.o., female MRN: [id number] DOB: Jan 03, 1961 Height: 5' 4.5" (163.8 cm) Weight: 103.6 kg  Insurance Information HMO:     PPO:      PCP:      IPA:      80/20:      OTHER:  PRIMARY: UNINSURED   Self Pay Estimate given on 06/16/23   Policy#:       Subscriber:  CM Name:       Phone#:      Fax#:  Pre-Cert#:       Employer:  Benefits:  Phone #:      Name:  Eff. Date:      Deduct:       Out of Pocket Max:       Life Max:  CIR:       SNF:  Outpatient:      Co-Pay:  Home Health:       Co-Pay:  DME:      Co-Pay:  Providers:  SECONDARY:       Policy#:      Phone#:   Artist:       Phone#:   The Data processing manager" for patients in Inpatient Rehabilitation Facilities with attached "Privacy Act Statement-Health Care Records" was provided and verbally reviewed with: N/A  Emergency Contact Information Contact Information   None on File    Other Contacts     Name Relation Home Work Mobile   [place]  [phone]  [phone]       Current Medical History  Patient Admitting Diagnosis: CVA History of Present Illness: Pt is a 63 year old female with no significant medical hx.  Pt presented to [facility] on 06/13/23 d/t sudden onset of left-sided numbness and weakness. Code stroke activated. CT head showed no acute abnormalities. CTA showed no LVO. TNK administered. Pt transferred to [facility] on 06/13/23 for further treatment. 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 Small remote hemorrhagic lacunar infarct at right lentiform nucleus. 2D Echo showed EF 70-75%. Therapy evaluations completed and CIR recommended d/t pt's deficits in functional mobility.  Complete NIHSS TOTAL: 3  Patient's medical record from [facility] has been reviewed by the rehabilitation admission coordinator and  physician.  Past Medical History  Past Medical History:  Diagnosis Date   Thyroid activity decreased    late 20's    Has the patient had major surgery during 100 days prior to admission? No  Family History   family history is not on file.  Current Medications  Current Facility-Administered Medications:    acetaminophen (TYLENOL) tablet 1,000 mg, 1,000 mg, Oral, TID PRN, Pearlean Brownie, Pramod S, MD, 1,000 mg at 06/15/23 2018   ALPRAZolam Prudy Feeler) tablet 0.25 mg, 0.25 mg, Oral, BID PRN, Hetty Blend C, NP, 0.25 mg at 06/15/23 2020   amLODipine (NORVASC) tablet 10 mg, 10 mg, Oral, Daily, Gevena Mart A, NP, 10 mg at 06/16/23 1040   aspirin EC tablet 81 mg, 81 mg, Oral, Daily, Peterson Ao, MD, 81 mg at 06/16/23 1040   Chlorhexidine Gluconate Cloth 2 % PADS 6 each, 6 each, Topical, Daily, Caryl Pina, MD, 6 each at 06/16/23 1041   clopidogrel (PLAVIX) tablet 75 mg, 75 mg, Oral, Daily, Peterson Ao, MD, 75 mg at 06/16/23 1040   enoxaparin (LOVENOX) injection 40 mg, 40 mg, Subcutaneous, Q24H, Peterson Ao, MD, 40 mg at 06/16/23 1217  hydrALAZINE (APRESOLINE) injection 10 mg, 10 mg, Intravenous, Once, Milon Dikes, MD   hydrALAZINE (APRESOLINE) injection 20 mg, 20 mg, Intravenous, Q6H PRN, Hetty Blend C, NP, 20 mg at 06/15/23 0937   melatonin tablet 3 mg, 3 mg, Oral, QHS, Lehner, Erin C, NP, 3 mg at 06/15/23 2021   methocarbamol (ROBAXIN) tablet 500 mg, 500 mg, Oral, Q8H PRN, Hetty Blend C, NP, 500 mg at 06/15/23 2021   nicotine (NICODERM CQ - dosed in mg/24 hr) patch 7 mg, 7 mg, Transdermal, Daily, Richardo Priest, Erin C, NP   ondansetron (ZOFRAN) injection 4 mg, 4 mg, Intravenous, Q6H PRN, Micki Riley, MD, 4 mg at 06/15/23 0865   Oral care mouth rinse, 15 mL, Mouth Rinse, PRN, Caryl Pina, MD   Oral care mouth rinse, 15 mL, Mouth Rinse, 4 times per day, Caryl Pina, MD, 15 mL at 06/16/23 1418   Oral care mouth rinse, 15 mL, Mouth Rinse, PRN, Caryl Pina, MD   pantoprazole  (PROTONIX) EC tablet 40 mg, 40 mg, Oral, QHS, Sethi, Pramod S, MD   polyethylene glycol (MIRALAX / GLYCOLAX) packet 17 g, 17 g, Oral, Daily, Peterson Ao, MD   rosuvastatin (CRESTOR) tablet 20 mg, 20 mg, Oral, Daily, Richardo Priest, Erin C, NP, 20 mg at 06/16/23 1041   senna-docusate (Senokot-S) tablet 1 tablet, 1 tablet, Oral, QHS PRN, Caryl Pina, MD   senna-docusate (Senokot-S) tablet 1 tablet, 1 tablet, Oral, BID, Peterson Ao, MD, 1 tablet at 06/16/23 1040  Patients Current Diet:  Diet Order             Diet Heart Room service appropriate? Yes; Fluid consistency: Thin  Diet effective now                   Precautions / Restrictions Precautions Precautions: Fall Precaution Comments: BP <180/105 Restrictions Weight Bearing Restrictions: No   Has the patient had 2 or more falls or a fall with injury in the past year? No  Prior Activity Level Community (5-7x/wk): gets out of house daily; drives  Prior Functional Level Self Care: Did the patient need help bathing, dressing, using the toilet or eating? Independent  Indoor Mobility: Did the patient need assistance with walking from room to room (with or without device)? Independent  Stairs: Did the patient need assistance with internal or external stairs (with or without device)? Independent  Functional Cognition: Did the patient need help planning regular tasks such as shopping or remembering to take medications? Independent  Patient Information Are you of Hispanic, Latino/a,or Spanish origin?: A. No, not of Hispanic, Latino/a, or Spanish origin What is your race?: A. White Do you need or want an interpreter to communicate with a doctor or health care staff?: 0. No  Patient's Response To:  Health Literacy and Transportation Is the patient able to respond to health literacy and transportation needs?: Yes Health Literacy - How often do you need to have someone help you when you read instructions, pamphlets, or other  written material from your doctor or pharmacy?: Never In the past 12 months, has lack of transportation kept you from medical appointments or from getting medications?: No In the past 12 months, has lack of transportation kept you from meetings, work, or from getting things needed for daily living?: No  Home Assistive Devices / Equipment Home Equipment: None  Prior Device Use: Indicate devices/aids used by the patient prior to current illness, exacerbation or injury? None of the above  Current Functional Level Cognition  Overall Cognitive Status:  Impaired/Different from baseline Orientation Level: Oriented X4 Following Commands: Follows one step commands consistently, Follows one step commands with increased time Safety/Judgement: Decreased awareness of safety General Comments: fearful of falling and LLE buckling, no evidence of buckling this date. step-by-step cuing for safety, periods of eye closing with mobility requiring cues for opening eyes    Extremity Assessment (includes Sensation/Coordination)  Upper Extremity Assessment: LUE deficits/detail, Right hand dominant LUE Deficits / Details: grossly 3-/5 (weaker proximally) and decreased coordination.  Sensation WFL.  Pt report improved since yesterday! LUE Sensation: WNL LUE Coordination: decreased fine motor, decreased gross motor  Lower Extremity Assessment: Defer to PT evaluation RLE Deficits / Details: heel to shin intact, grossly 4/5 for MMT LLE Deficits / Details: heel to shin slow and decreased acciracy, grossly 3+/5 to 4-/5 with MMT LLE Sensation: decreased light touch, decreased proprioception LLE Coordination: decreased gross motor, decreased fine motor    ADLs  Overall ADL's : Needs assistance/impaired Grooming: Minimal assistance, Sitting Upper Body Dressing : Moderate assistance, Sitting Lower Body Dressing: Maximal assistance, +2 for physical assistance, Sit to/from stand Toilet Transfer Details (indicate cue type  and reason): deferred Functional mobility during ADLs: Moderate assistance, +2 for physical assistance    Mobility  Overal bed mobility: Needs Assistance Bed Mobility: Supine to Sit Supine to sit: Supervision Sit to supine: Mod assist General bed mobility comments: cues for seqeuncing to EOB, pt using RUE to move LUE to EOB. Increased time    Transfers  Overall transfer level: Needs assistance Equipment used: Rolling walker (2 wheels) Transfers: Sit to/from Stand, Bed to chair/wheelchair/BSC Sit to Stand: Min assist Bed to/from chair/wheelchair/BSC transfer type:: Step pivot Step pivot transfers: Min assist General transfer comment: assist for rise and steady, pt using RUE to place LUE on RW. stand x5, from EOB x2 recliner x2 and toilet x1.    Ambulation / Gait / Stairs / Wheelchair Mobility  Ambulation/Gait Ambulation/Gait assistance: Min assist, +2 safety/equipment Gait Distance (Feet): 8 Feet Assistive device: Rolling walker (2 wheels) Gait Pattern/deviations: Step-through pattern, Decreased step length - left, Decreased stance time - left, Trunk flexed, Narrow base of support General Gait Details: assist to steady, guard LLE and keep LUE on RW, cues for upright posture and increasing foot clearance LLE. Gait velocity: decr    Posture / Balance Balance Overall balance assessment: Needs assistance Sitting-balance support: Feet supported, Bilateral upper extremity supported Sitting balance-Leahy Scale: Fair Standing balance support: Bilateral upper extremity supported, During functional activity, Reliant on assistive device for balance Standing balance-Leahy Scale: Poor Standing balance comment: standing marches x5, lateral steping bilat, weight shifting L/R as pregait    Special needs/care consideration External Urinary Catheter   Previous Home Environment (from acute therapy documentation) Living Arrangements: Spouse/significant other  Lives With: Spouse, Family Available  Help at Discharge: Family, Available 24 hours/day Type of Home: House Home Layout: One level Home Access: Stairs to enter Entrance Stairs-Rails: None Entrance Stairs-Number of Steps: threshold Bathroom Shower/Tub: Health visitor: Standard Bathroom Accessibility: Yes How Accessible: Accessible via walker Home Care Services: No  Discharge Living Setting Plans for Discharge Living Setting: Patient's home Type of Home at Discharge: House Discharge Home Layout: One level Discharge Home Access: Stairs to enter Entrance Stairs-Rails: None Entrance Stairs-Number of Steps: threshold Discharge Bathroom Shower/Tub: Walk-in shower Discharge Bathroom Toilet: Standard Discharge Bathroom Accessibility: Yes How Accessible: Accessible via walker Does the patient have any problems obtaining your medications?: No  Social/Family/Support Systems Anticipated Caregiver: Rebekah Mendez (husband), Government social research officer (daugher) &  other family Anticipated Caregiver's Contact Information: Dorene Sorrow: [phone] and Ocie Bob: [phone] Caregiver Availability: 24/7 Discharge Plan Discussed with Primary Caregiver: Yes Is Caregiver In Agreement with Plan?: Yes Does Caregiver/Family have Issues with Lodging/Transportation while Pt is in Rehab?: No  Goals Patient/Family Goal for Rehab: Supervision: PT/OT Expected length of stay: 7-10 days Pt/Family Agrees to Admission and willing to participate: No Program Orientation Provided & Reviewed with Pt/Caregiver Including Roles  & Responsibilities: No  Decrease burden of Care through IP rehab admission: NA  Possible need for SNF placement upon discharge: Not anticipated  Patient Condition: I have reviewed medical records from [facility], spoken with CM, and patient, spouse, and daughter. I met with patient at the bedside and discussed via phone for inpatient rehabilitation assessment.  Patient will benefit from ongoing PT and OT, can actively  participate in 3 hours of therapy a day 5 days of the week, and can make measurable gains during the admission.  Patient will also benefit from the coordinated team approach during an Inpatient Acute Rehabilitation admission.  The patient will receive intensive therapy as well as Rehabilitation physician, nursing, social worker, and care management interventions.  Due to bladder management, safety, disease management, medication administration, pain management, and patient education the patient requires 24 hour a day rehabilitation nursing.  The patient is currently *** with mobility and basic ADLs.  Discharge setting and therapy post discharge at home with home health is anticipated.  Patient has agreed to participate in the Acute Inpatient Rehabilitation Program and will admit {Time; today/tomorrow:10263}.  Preadmission Screen Completed By:  Domingo Pulse, 06/16/2023 3:18 PM ______________________________________________________________________   Discussed status with Dr. Marland Kitchen on *** at *** and received approval for admission today.  Admission Coordinator:  Domingo Pulse, CCC-SLP, time ***/Date ***   Assessment/Plan: Diagnosis: Does the need for close, 24 hr/day Medical supervision in concert with the patient's rehab needs make it unreasonable for this patient to be served in a less intensive setting? {yes_no_potentially:3041433} Co-Morbidities requiring supervision/potential complications: *** Due to {due MV:7846962}, does the patient require 24 hr/day rehab nursing? {yes_no_potentially:3041433} Does the patient require coordinated care of a physician, rehab nurse, PT, OT, and SLP to address physical and functional deficits in the context of the above medical diagnosis(es)? {yes_no_potentially:3041433} Addressing deficits in the following areas: {deficits:3041436} Can the patient actively participate in an intensive therapy program of at least 3 hrs of therapy 5 days a week?  {yes_no_potentially:3041433} The potential for patient to make measurable gains while on inpatient rehab is {potential:3041437} Anticipated functional outcomes upon discharge from inpatient rehab: {functional outcomes:304600100} PT, {functional outcomes:304600100} OT, {functional outcomes:304600100} SLP Estimated rehab length of stay to reach the above functional goals is: *** Anticipated discharge destination: {anticipated dc setting:21604} 10. Overall Rehab/Functional Prognosis: {potential:3041437}   MD Signature: ***

## 2023-06-15 NOTE — Evaluation (Signed)
Occupational Therapy Evaluation Patient Details Name: Rebekah Mendez MRN: [id number] DOB: 12-07-1960 Today's Date: 06/15/2023   History of Present Illness Patient is 62 y.o. female who presented to ED on 06/13/23 w/ sudden onset of Lt sided weakness/numbness. Code [place]oke activated, TNK administered at [facility] and transferred to [facility]. 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 Small remote hemorrhagic lacunar infarct at the right lentiform nucleus. PMH significant for decreased thyroid activity.   Clinical Impression   PTA patient independent. Admitted for above and presents with problem list below.  She completes bed mobility with min guard to mod assist, transfers with mod assist +2 and Adls with min to max assist.  Session limited by nausea (noted vomiting this am).  She has weakness and decreased coordination on L side, impaired balance, and decreased problem solving.  She is highly motivated and has great family support. Educated pt/family on keeping L UE supported on pillow and starting exercises (provided hand grasp/opening, and elbow flexion/extension today). Based on performance today, believe pt will best benefit from continued OT services acutely and after dc at an inpatient setting with >3hrs/day to optimize independence, safety and return to PLOF with ADLs and mobility.      If plan is discharge home, recommend the following: Two people to help with walking and/or transfers;A lot of help with bathing/dressing/bathroom;Assistance with cooking/housework;Assist for transportation;Help with stairs or ramp for entrance    Functional Status Assessment  Patient has had a recent decline in their functional status and demonstrates the ability to make significant improvements in function in a reasonable and predictable amount of time.  Equipment Recommendations  Other (comment) (defer)    Recommendations for Other Services  Rehab consult     Precautions / Restrictions Precautions Precautions: Fall Precaution Comments: BP <180/105 Restrictions Weight Bearing Restrictions: No      Mobility Bed Mobility Overal bed mobility: Needs Assistance Bed Mobility: Supine to Sit, Sit to Supine     Supine to sit: Contact guard Sit to supine: Mod assist   General bed mobility comments: HOB elevated and min guard for safety, increased time; returned back to bed with support for LB/trunk    Transfers Overall transfer level: Needs assistance Equipment used: 2 person hand held assist Transfers: Sit to/from Stand Sit to Stand: Mod assist, +2 physical assistance           General transfer comment: to power up and steady, L LE blocked and buckling. requires multiple attemtps to stand.  pt declines attempt to recliner due to nausea.      Balance Overall balance assessment: Needs assistance Sitting-balance support: Feet supported, Bilateral upper extremity supported Sitting balance-Leahy Scale: Fair     Standing balance support: Bilateral upper extremity supported, No upper extremity supported, During functional activity Standing balance-Leahy Scale: Poor                             ADL either performed or assessed with clinical judgement   ADL Overall ADL's : Needs assistance/impaired     Grooming: Minimal assistance;Sitting           Upper Body Dressing : Moderate assistance;Sitting   Lower Body Dressing: Maximal assistance;+2 for physical assistance;Sit to/from Market researcher Details (indicate cue type and reason): deferred         Functional mobility during ADLs: Moderate assistance;+2 for physical assistance  Vision Baseline Vision/History: 1 Wears glasses Vision Assessment?: No apparent visual deficits Additional Comments: pt denies changes, continue to assess     Perception         Praxis         Pertinent Vitals/Pain Pain Assessment Pain  Assessment: No/denies pain     Extremity/Trunk Assessment Upper Extremity Assessment Upper Extremity Assessment: LUE deficits/detail;Right hand dominant LUE Deficits / Details: grossly 3-/5 (weaker proximally) and decreased coordination.  Sensation WFL.  Pt report improved since yesterday! LUE Sensation: WNL LUE Coordination: decreased fine motor;decreased gross motor   Lower Extremity Assessment Lower Extremity Assessment: Defer to PT evaluation   Cervical / Trunk Assessment Cervical / Trunk Assessment: Normal   Communication Communication Communication: No apparent difficulties Cueing Techniques: Verbal cues;Tactile cues   Cognition Arousal: Alert Behavior During Therapy: WFL for tasks assessed/performed Overall Cognitive Status: Impaired/Different from baseline Area of Impairment: Following commands, Safety/judgement, Awareness, Problem solving                       Following Commands: Follows one step commands consistently, Follows one step commands with increased time, Follows multi-step commands inconsistently Safety/Judgement: Decreased awareness of deficits, Decreased awareness of safety Awareness: Emergent Problem Solving: Slow processing, Decreased initiation, Difficulty sequencing, Requires verbal cues, Requires tactile cues General Comments: pt oriented and following commands, some slow processing and decreased problem sovling.  internally distracted by nausea during session.     General Comments  VSS on RA    Exercises     Shoulder Instructions      Home Living Family/patient expects to be discharged to:: Private residence Living Arrangements: Spouse/significant other Available Help at Discharge: Family Type of Home: House Home Access: Stairs to enter Secretary/administrator of Steps: 1 Entrance Stairs-Rails: None Home Layout: One level     Bathroom Shower/Tub: Producer, television/film/video: Standard Bathroom Accessibility: Yes   Home  Equipment: None          Prior Functioning/Environment Prior Level of Function : Independent/Modified Independent;Driving;Working/employed               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 Problem List: Decreased strength;Decreased activity tolerance;Impaired balance (sitting and/or standing);Decreased coordination;Decreased cognition;Decreased safety awareness;Decreased knowledge of precautions;Decreased knowledge of use of DME or AE;Obesity;Impaired UE functional use      OT Treatment/Interventions: Self-care/ADL training;Neuromuscular education;DME and/or AE instruction;Therapeutic activities;Cognitive remediation/compensation;Balance training;Patient/family education    OT Goals(Current goals can be found in the care plan section) Acute Rehab OT Goals Patient Stated Goal: get better OT Goal Formulation: With patient Time For Goal Achievement: 06/29/23 Potential to Achieve Goals: Good  OT Frequency: Min 1X/week    Co-evaluation              AM-PAC OT "6 Clicks" Daily Activity     Outcome Measure Help from another person eating meals?: A Little Help from another person taking care of personal grooming?: A Little Help from another person toileting, which includes using toliet, bedpan, or urinal?: A Lot Help from another person bathing (including washing, rinsing, drying)?: A Lot Help from another person to put on and taking off regular upper body clothing?: A Lot Help from another person to put on and taking off regular lower body clothing?: A Lot 6 Click Score: 14   End of Session Equipment Utilized During Treatment: Gait belt Nurse Communication: Mobility status  Activity Tolerance: Patient tolerated treatment well Patient left:  in bed;with call bell/phone within reach;with bed alarm set;with nursing/sitter in room;with family/visitor present;with SCD's reapplied  OT Visit Diagnosis: Other abnormalities of gait and mobility (R26.89);Muscle  weakness (generalized) (M62.81);Other symptoms and signs involving cognitive function;Other symptoms and signs involving the nervous system (R29.898)                Time: 1610-9604 OT Time Calculation (min): 23 min Charges:  OT General Charges $OT Visit: 1 Visit OT Evaluation $OT Eval Moderate Complexity: 1 Mod OT Treatments $Self Care/Home Management : 8-22 mins  Barry Brunner, OT Acute Rehabilitation Services Office [phone]   Chancy Milroy 06/15/2023, 2:25 PM

## 2023-06-15 NOTE — Progress Notes (Signed)
VASCULAR LAB    TCD bubble study has been performed.  See CV proc for preliminary results.   Melecio Cueto, RVT 06/15/2023, 1:22 PM

## 2023-06-15 NOTE — Progress Notes (Signed)
This RN spoke with Maralyn Sago RN from Dr. Marlis Edelson research team. Following pt for possible sleep study candidacy.   For any questions during day shift, can reach Sarah at [phone].  Night shift RN Pam available at [phone].

## 2023-06-15 NOTE — Progress Notes (Addendum)
STROKE TEAM PROGRESS NOTE   BRIEF HPI [name] is a  62 year old woman with no significant past medical history because she has not seen a physician presented to the emergency department for evaluation of sudden onset of left-sided face arm and leg weakness along with decreased sensation on the left and some slurred speech.  She was having dinner around 6:30 PM when she had sudden onset of left-sided numbness and weakness. CODE STROKE activated at [facility]. BP 225 systolic on arrival. Patient was reportedly also very anxious on arrival. NIH: 4. After BP controlled, TNK was given at 2007.  MRI shows R basal ganglia infarct.    SIGNIFICANT HOSPITAL EVENTS 11/1: TNK given @ 2007 11/2 PT recommends acute inpatient rehab   INTERIM HISTORY/SUBJECTIVE Family bedside for support. Patient reports good improvements with strength this morning and slept well overnight. Complaints of some nausea per nursing and given zofran. Patient amenable with getting TCD bubble study today. Discussed lower extremity dopplers and no source of DVT. Also discussed reassuring carotid US and echo study results.  Patient did sign consent to participate in the sleep smart study and tested positive on the overnight NOx 3 monitor for obstructive sleep apnea OBJECTIVE  CBC    Component Value Date/Time   WBC 9.5 06/13/2023 1923   RBC 4.55 06/13/2023 1923   HGB 13.6 06/13/2023 1935   HCT 40.0 06/13/2023 1935   PLT 299 06/13/2023 1923   MCV 87.3 06/13/2023 1923   MCH 27.7 06/13/2023 1923   MCHC 31.7 06/13/2023 1923   RDW 13.1 06/13/2023 1923   LYMPHSABS 3.2 06/13/2023 1923   MONOABS 0.6 06/13/2023 1923   EOSABS 0.2 06/13/2023 1923   BASOSABS 0.1 06/13/2023 1923    BMET    Component Value Date/Time   NA 141 06/13/2023 1935   K 3.4 (L) 06/13/2023 1935   CL 102 06/13/2023 1935   CO2 28 06/13/2023 1923   GLUCOSE 118 (H) 06/13/2023 1935   BUN 16 06/13/2023 1935   CREATININE 0.90 06/13/2023 1935    CALCIUM 9.6 06/13/2023 1923   GFRNONAA >60 06/13/2023 1923    IMAGING past 24 hours VAS [place] LOWER EXTREMITY VENOUS (DVT)  Result Date: 06/15/2023  Lower Venous DVT Study Patient Name:  Rebekah Mendez  Date of Exam:   06/14/2023 Medical Rec #: [id number]       Accession #:    [id number] Date of Birth: Feb 02, 1961      Patient Gender: F Patient Age:   89 years Exam Location:  [facility] Procedure:      VAS [place] LOWER EXTREMITY VENOUS (DVT) Referring Phys: Hetty Blend --------------------------------------------------------------------------------  Indications: Stroke.  Comparison Study: No prior study on file Performing Technologist: Sherren Kerns RVS  Examination Guidelines: A complete evaluation includes B-mode imaging, spectral Doppler, color Doppler, and power Doppler as needed of all accessible portions of each vessel. Bilateral testing is considered an integral part of a complete examination. Limited examinations for reoccurring indications may be performed as noted. The reflux portion of the exam is performed with the patient in reverse Trendelenburg.  +---------+---------------+---------+-----------+----------+--------------+ RIGHT    CompressibilityPhasicitySpontaneityPropertiesThrombus Aging +---------+---------------+---------+-----------+----------+--------------+ CFV      Full           Yes      Yes                                 +---------+---------------+---------+-----------+----------+--------------+ SFJ  Full                                                        +---------+---------------+---------+-----------+----------+--------------+ FV Prox  Full                                                        +---------+---------------+---------+-----------+----------+--------------+ FV Mid   Full                                                        +---------+---------------+---------+-----------+----------+--------------+ FV DistalFull                                                         +---------+---------------+---------+-----------+----------+--------------+ PFV      Full                                                        +---------+---------------+---------+-----------+----------+--------------+ POP      Full           Yes      Yes                                 +---------+---------------+---------+-----------+----------+--------------+ PTV      Full                                                        +---------+---------------+---------+-----------+----------+--------------+ PERO     Full                                                        +---------+---------------+---------+-----------+----------+--------------+ Gastroc  Full                                                        +---------+---------------+---------+-----------+----------+--------------+   +---------+---------------+---------+-----------+----------+--------------+ LEFT     CompressibilityPhasicitySpontaneityPropertiesThrombus Aging +---------+---------------+---------+-----------+----------+--------------+ CFV      Full           Yes      Yes                                 +---------+---------------+---------+-----------+----------+--------------+  SFJ      Full                                                        +---------+---------------+---------+-----------+----------+--------------+ FV Prox  Full                                                        +---------+---------------+---------+-----------+----------+--------------+ FV Mid   Full                                                        +---------+---------------+---------+-----------+----------+--------------+ FV DistalFull                                                        +---------+---------------+---------+-----------+----------+--------------+ PFV      Full                                                         +---------+---------------+---------+-----------+----------+--------------+ POP      Full           Yes      Yes                                 +---------+---------------+---------+-----------+----------+--------------+ PTV      Full                                                        +---------+---------------+---------+-----------+----------+--------------+ PERO     Full                                                        +---------+---------------+---------+-----------+----------+--------------+     Summary: BILATERAL: - No evidence of deep vein thrombosis seen in the lower extremities, bilaterally. -No evidence of popliteal cyst, bilaterally.   *See table(s) above for measurements and observations. Electronically signed by Coral Else MD on 06/15/2023 at 8:16:15 AM.    Final    VAS US CAROTID  Result Date: 06/15/2023 Carotid Arterial Duplex Study Patient Name:  DONATA REDDICK  Date of Exam:   06/14/2023 Medical Rec #: [id number]       Accession #:    [id number] Date of Birth: [birth year]      Patient Gender: F Patient Age:   63 years Exam Location:  [facility] Procedure:      VAS US CAROTID Referring Phys: Hetty Blend --------------------------------------------------------------------------------  Indications:       CVA, Numbness and Weakness. Risk Factors:      Current smoker. Comparison Study:  No prior study on file Performing Technologist: Sherren Kerns RVS  Examination Guidelines: A complete evaluation includes B-mode imaging, spectral Doppler, color Doppler, and power Doppler as needed of all accessible portions of each vessel. Bilateral testing is considered an integral part of a complete examination. Limited examinations for reoccurring indications may be performed as noted.  Right Carotid Findings: +----------+--------+--------+--------+------------------+------------------+           PSV cm/sEDV cm/sStenosisPlaque DescriptionComments            +----------+--------+--------+--------+------------------+------------------+ CCA Prox  133     21                                intimal thickening +----------+--------+--------+--------+------------------+------------------+ CCA Distal100     31                                intimal thickening +----------+--------+--------+--------+------------------+------------------+ ICA Prox  87      27              homogeneous                          +----------+--------+--------+--------+------------------+------------------+ ICA Mid   112     51                                                   +----------+--------+--------+--------+------------------+------------------+ ICA Distal166     73                                                   +----------+--------+--------+--------+------------------+------------------+ ECA       115     25                                                   +----------+--------+--------+--------+------------------+------------------+ +----------+--------+-------+--------+-------------------+           PSV cm/sEDV cmsDescribeArm Pressure (mmHG) +----------+--------+-------+--------+-------------------+ [id number]                                        +----------+--------+-------+--------+-------------------+ +---------+--------+--+--------+--+ VertebralPSV cm/s91EDV cm/s32 +---------+--------+--+--------+--+ Elevated systolic and diastolic velocities are elevated with no significant plaque noted in the mid to distal ICA Left Carotid Findings: +----------+--------+--------+--------+------------------+------------------+           PSV cm/sEDV cm/sStenosisPlaque DescriptionComments           +----------+--------+--------+--------+------------------+------------------+ CCA Prox  119     28                                intimal thickening +----------+--------+--------+--------+------------------+------------------+ CCA  Distal107     26                                intimal thickening +----------+--------+--------+--------+------------------+------------------+ ICA Prox  87      32                                                   +----------+--------+--------+--------+------------------+------------------+ ICA Mid   289     114                                                  +----------+--------+--------+--------+------------------+------------------+ ICA Distal214     71                                                   +----------+--------+--------+--------+------------------+------------------+ ECA       123     17                                                   +----------+--------+--------+--------+------------------+------------------+ +----------+--------+--------+--------+-------------------+           PSV cm/sEDV cm/sDescribeArm Pressure (mmHG) +----------+--------+--------+--------+-------------------+ [id number]                                         +----------+--------+--------+--------+-------------------+ +---------+--------+--+--------+--+ VertebralPSV cm/s90EDV cm/s35 +---------+--------+--+--------+--+ Elevated systolic and diastolic velocities are elevated with no significant plaque noted in the mid to distal ICA  Summary: Right Carotid: Elevated systolic and diastolic velocities are elevated with mild                nonsignificant plaque noted in the mid to distal ICA. Left Carotid: There is no evidence of stenosis in the left ICA. Elevated               systolic and diastolic velocities are elevated with no significant               plaque noted in the mid to distal ICA. Vertebrals:  Bilateral vertebral arteries demonstrate antegrade flow. Subclavians: Normal flow hemodynamics were seen in bilateral subclavian              arteries. *See table(s) above for measurements and observations.  Electronically signed by Delia Heady MD on 06/15/2023 at 7:10:35  AM.    Final    CT HEAD WO CONTRAST ( )  Result Date: 06/14/2023 CLINICAL DATA:  Follow-up examination for stroke. EXAM: CT HEAD WITHOUT CONTRAST TECHNIQUE: Contiguous axial images were obtained from the base of the skull through the vertex without intravenous contrast. RADIATION DOSE REDUCTION: This exam was performed according to the departmental dose-optimization program which includes automated exposure control, adjustment of the mA and/or kV according to patient size and/or use of iterative reconstruction technique. COMPARISON:  Prior MRI from 06/13/2023. 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 No significant mass effect. No evidence for hemorrhagic transformation. No other acute intracranial hemorrhage or large vessel territory infarct. No mass lesion or midline shift. No hydrocephalus or extra-axial fluid collection. Vascular: No abnormal hyperdense vessel. Skull: Scalp soft tissues and calvarium demonstrate no new finding. Sinuses/Orbits: Globes orbital soft tissues within normal limits. Paranasal sinuses and mastoid air cells remain clear. Other: None. IMPRESSION: 1. Continued interval evolution of previously identified perforator infarct involving the right basal ganglia, relatively stable in size. No evidence for hemorrhagic transformation or significant regional mass effect. 2. No other new acute intracranial abnormality. Electronically Signed   By: Rise Mu M.D.   On: 06/14/2023 19:56   ECHOCARDIOGRAM COMPLETE  Result Date: 06/14/2023    ECHOCARDIOGRAM REPORT   Patient Name:   LUANNE KRZYZANOWSKI Date of Exam: 06/14/2023 Medical Rec #:  [id number]      Height:       64.5 in Accession #:    [id number]     Weight:       228.4 lb Date of Birth:  1961-02-03     BSA:          2.081 m Patient Age:    61 years       BP:           139/76 mmHg Patient Gender: F              HR:            72 bpm. Exam Location:  Inpatient Procedure: 2D Echo, Color Doppler and Cardiac Doppler Indications:    stroke  History:        Patient has no prior history of Echocardiogram examinations.  Sonographer:    Delcie Roch RDCS Referring Phys: [id number] ERIC LINDZEN IMPRESSIONS  1. Left ventricular ejection fraction, by estimation, is 70 to 75%. The left ventricle has hyperdynamic function. The left ventricle has no regional wall motion abnormalities. Left ventricular diastolic parameters are indeterminate.  2. Right ventricular systolic function is normal. The right ventricular size is normal.  3. The mitral valve is normal in structure. No evidence of mitral valve regurgitation. No evidence of mitral stenosis.  4. The aortic valve is tricuspid. Aortic valve regurgitation is not visualized. No aortic stenosis is present.  5. The inferior vena cava is normal in size with greater than 50% respiratory variability, suggesting right atrial pressure of 3 mmHg. Comparison(s): No prior Echocardiogram. FINDINGS  Left Ventricle: Left ventricular ejection fraction, by estimation, is 70 to 75%. The left ventricle has hyperdynamic function. The left ventricle has no regional wall motion abnormalities. The left ventricular internal cavity size was normal in size. There is no left ventricular hypertrophy. Left ventricular diastolic parameters are indeterminate. Right Ventricle: The right ventricular size is normal. Right ventricular systolic function is normal. Left Atrium: Left atrial size was normal in size. Right Atrium: Right atrial size was normal in size. Pericardium: There is no evidence of pericardial effusion. Mitral Valve: The mitral valve is normal in structure. No evidence of mitral valve regurgitation. No evidence of mitral valve stenosis. Tricuspid Valve: The tricuspid valve is normal in structure. Tricuspid valve regurgitation is trivial. No evidence of tricuspid stenosis. Aortic Valve: The aortic valve is tricuspid.  Aortic valve regurgitation is not visualized. No aortic stenosis is present. Pulmonic Valve: The pulmonic valve was normal in structure. Pulmonic valve regurgitation is not visualized. No evidence of  pulmonic stenosis. Aorta: The aortic root is normal in size and structure. Venous: The inferior vena cava is normal in size with greater than 50% respiratory variability, suggesting right atrial pressure of 3 mmHg. IAS/Shunts: No atrial level shunt detected by color flow Doppler.  LEFT VENTRICLE PLAX 2D LVIDd:         4.80 cm   Diastology LVIDs:         2.60 cm   LV e' medial:  9.14 cm/s LV PW:         1.10 cm   LV e' lateral: 8.05 cm/s LV IVS:        1.20 cm LVOT diam:     1.90 cm LV SV:         79 LV SV Index:   38 LVOT Area:     2.84 cm  RIGHT VENTRICLE             IVC RV Basal diam:  2.80 cm     IVC diam: 1.60 cm RV S prime:     17.00 cm/s TAPSE (M-mode): 2.2 cm LEFT ATRIUM             Index        RIGHT ATRIUM           Index LA diam:        3.30 cm 1.59 cm/m   RA Area:     12.80 cm LA Vol (A2C):   68.0 ml 32.68 ml/m  RA Volume:   28.30 ml  13.60 ml/m LA Vol (A4C):   58.5 ml 28.11 ml/m LA Biplane Vol: 63.6 ml 30.56 ml/m  AORTIC VALVE LVOT Vmax:   140.00 cm/s LVOT Vmean:  94.100 cm/s LVOT VTI:    0.277 m  AORTA Ao Root diam: 3.20 cm Ao Asc diam:  3.40 cm  SHUNTS Systemic VTI:  0.28 m Systemic Diam: 1.90 cm Olga Millers MD Electronically signed by Olga Millers MD Signature Date/Time: 06/14/2023/4:16:38 PM    Final     Vitals:   06/15/23 0900 06/15/23 0911 06/15/23 0937 06/15/23 1001  BP: (!) 183/92 (!) 183/92 (!) 173/81 (!) 151/72  Pulse: 90   88  Resp: 18   17  Temp:      TempSrc:      SpO2: 94%   96%  Weight:      Height:         PHYSICAL EXAM General:  Alert, well-nourished, well-developed patient in no acute distress Psych:  Mood and affect appropriate for situation CV: Regular rate and rhythm on monitor Respiratory:  Regular, unlabored respirations on room air  NEURO:  Mental  Status: AA&Ox3, patient is able to give clear and coherent history Speech/Language: speech is without dysarthria or aphasia.  Naming, repetition, fluency, and comprehension intact.  Cranial Nerves:  II: PERRL. Visual fields full.  III, IV, VI: EOMI. Eyelids elevate symmetrically.  V: Sensation decreased to light touch on left side  VII: Minimal left sided asymmetry  VIII: hearing intact to voice. IX, X: Palate elevates symmetrically. Phonation is normal.  WU:JWJXBJYN shrug /5. XII: tongue is midline without fasciculations. 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 ASPECTS 10.    CTA head & neck  No LVO or significant stenosis Beading of bilateral mid ICAs, often seen in fibromuscular dysplasia   MRI   1.6cm acute ischemic non-hemorrhagic posterior right basal ganglia infarct Small remote hemorrhagic lacunar infarct right lentiform nucleus   2D Echo: EF 70-75%. LV hyperdynamic and no wall motion abnormalities.  Carotid US: Non significant plaque noted in mid to distal R ICA and L ICA.  No evidence of stenosis.   TCD w/Bubble Study: PENDING [place] LE DVT: No evidence of DVT  ANA labs: PENDING LDL 111 HgbA1c 5.6   VTE prophylaxis - SCDs and Lovenox ordered  No hemorrhage on CT  Will start patient on aspirin and plavix for 3 weeks, followed by aspirin monotherapy.  Therapy recommendations:  Acute inpatient rehab  Disposition:  pending   Hypertension Home meds:  none Discontinued Cleviprex gtt  Continue amlodipine 5mg  Hydralazine 20mg  IV Q6H PRN added Unstable Blood Pressure Goal: BP  less than 180/105    Hyperlipidemia Home meds:  none LDL 111, goal < 70 Add Crestor 20mg   Continue statin at discharge   Diabetes type II , no history Home meds:  none HgbA1c 5.6, goal < 7.0 CBGs SSI Recommend close follow-up with PCP   Tobacco Abuse Current cigarette smoker       Ready to quit? Yes Nicotine replacement therapy ordered and cessation education provided   Other Stroke Risk Factors Obesity, Body mass index is 38.6 kg/m., BMI >/= 30 associated with increased stroke risk, recommend weight loss, diet and exercise as appropriate  ?Obstructive sleep apnea discussed SleepSmart trial with patient, pending decision   Other Active Problems Poor Medical Management/Does not visit doctor regularly TOC consult placed for assistance with finding a PCP C/o Muscle Spasms and pain Extra-strength tylenol added PRN Robaxin 500 TID added PRN Anxiety Xanax 0.25 BID added PRN  Hospital day # 2  I have personally obtained history,examined this patient, reviewed notes, independently viewed imaging studies, participated in medical decision making and plan of care.ROS completed by me personally and pertinent positives fully documented  I have made any additions or clarifications directly to the above note. Agree with note above.  Patient has shown slight improvement in left hemiparesis and facial weakness.  Therapist recommend inpatient rehab.  Carotid ultrasound and echocardiogram and lower extremity venous Dopplers unremarkable.  TCD bubble study pending.  Recommend mobilize out of bed.  Transfer to neurology floor bed. Patient did sign consent to participate in the sleep smart study and tested positive on the overnight NOx 3 monitor for obstructive sleep apnea.  She will have CPAP mask tolerability test tonight.  Long discussion with the patient, husband and daughters at the bedside and answered questions.  Greater than 50% time during this 50-minute visit was spent on counseling and  coordination of care about her stroke and discussion about stroke evaluation and prevention and treatment and answering questions Delia Heady, MD Medical Director [facility] Pager: [phone] 06/15/2023 1:56 PM  To contact Stroke Continuity provider, please refer to [email or web address]. After hours, contact General Neurology

## 2023-06-15 NOTE — Progress Notes (Signed)
Inpatient Rehab Admissions Coordinator Note:   Per PT patient was screened for CIR candidacy by Marlo Goodrich Luvenia Starch, CCC-SLP. At this time, pt appears to be a potential candidate for CIR. I will place an order for rehab consult for full assessment, per our protocol.  Please contact me any with questions.Wolfgang Phoenix, MS, CCC-SLP Admissions Coordinator [phone] 06/15/23 12:47 PM

## 2023-06-15 NOTE — Progress Notes (Signed)
OT Cancellation Note  Patient Details Name: Rebekah Mendez MRN: [id number] DOB: 11-May-1961   Cancelled Treatment:    Reason Eval/Treat Not Completed: Other (comment)- pt nauseated, will check back as able.   Barry Brunner, OT Acute Rehabilitation Services Office [phone]   Chancy Milroy 06/15/2023, 8:54 AM

## 2023-06-15 NOTE — Progress Notes (Signed)
Inpatient Rehab Admissions:  Inpatient Rehab Consult received.  I met with patient and daughter Ocie Bob at the bedside for rehabilitation assessment and to discuss goals and expectations of an inpatient rehab admission.  Discussed average length of stay, cost of CIR for an uninsured pt, and discharge home after completion of CIR. Both acknowledged understanding. Pt interested in pursuing CIR. Daughter supportive. Daughter confirmed that family will be able to provide 24/7 support for pt after discharge. Will continue to follow.  Signed: Wolfgang Phoenix, MS, CCC-SLP Admissions Coordinator [phone]

## 2023-06-16 DIAGNOSIS — G4733 Obstructive sleep apnea (adult) (pediatric): Secondary | ICD-10-CM

## 2023-06-16 MED ORDER — ORAL CARE MOUTH RINSE: 15.0000 mL | OROMUCOSAL | Status: DC | PRN

## 2023-06-16 MED ORDER — SENNOSIDES-DOCUSATE SODIUM 8.6-50 MG PO TABS
1.0000 | ORAL_TABLET | Freq: Two times a day (BID) | ORAL | Status: DC
  Administered 2023-06-16 – 2023-06-17 (×3): 1 via ORAL
  Filled 2023-06-16 (×3): qty 1

## 2023-06-16 MED ORDER — ORAL CARE MOUTH RINSE
15.0000 mL | OROMUCOSAL | Status: DC
  Administered 2023-06-16 – 2023-06-17 (×5): 15 mL via OROMUCOSAL

## 2023-06-16 MED ORDER — PANTOPRAZOLE SODIUM 40 MG PO TBEC
40.0000 mg | DELAYED_RELEASE_TABLET | Freq: Every day | ORAL | Status: DC
  Administered 2023-06-16: 40 mg via ORAL
  Filled 2023-06-16: qty 1

## 2023-06-16 MED ORDER — POLYETHYLENE GLYCOL 3350 17 G PO PACK
17.0000 g | PACK | Freq: Every day | ORAL | Status: DC
  Administered 2023-06-17: 17 g via ORAL
  Filled 2023-06-16: qty 1

## 2023-06-16 NOTE — Progress Notes (Addendum)
STROKE TEAM PROGRESS NOTE   BRIEF HPI [name] is a  62 year old woman with no significant past medical history because she has not seen a physician presented to the emergency department for evaluation of sudden onset of left-sided face arm and leg weakness along with decreased sensation on the left and some slurred speech.  She was having dinner around 6:30 PM when she had sudden onset of left-sided numbness and weakness. CODE STROKE activated at [facility]. BP 225 systolic on arrival. Patient was reportedly also very anxious on arrival. NIH: 4. After BP controlled, TNK was given at 2007.  MRI shows R basal ganglia infarct.    SIGNIFICANT HOSPITAL EVENTS 11/1: TNK given @ 2007 11/2 PT recommends acute inpatient rehab  11/3 Bubble Study negative, sleep study positive for OSA,   INTERIM HISTORY/SUBJECTIVE Family bedside for support. Tolerated CPAP last night without complications. Patient continues to improve this morning. Left sided arm and leg weakness better. Worked with PT yesterday and was able to participate and walk to the bathroom with assistance this morning. PT still recommended inpatient rehab per the patient.   Patient did tolerate CPAP mask overnight and is eligible to participate in the sleep smart study and was randomized to treatment with CPAP device OBJECTIVE  CBC    Component Value Date/Time   WBC 9.5 06/13/2023 1923   RBC 4.55 06/13/2023 1923   HGB 13.6 06/13/2023 1935   HCT 40.0 06/13/2023 1935   PLT 299 06/13/2023 1923   MCV 87.3 06/13/2023 1923   MCH 27.7 06/13/2023 1923   MCHC 31.7 06/13/2023 1923   RDW 13.1 06/13/2023 1923   LYMPHSABS 3.2 06/13/2023 1923   MONOABS 0.6 06/13/2023 1923   EOSABS 0.2 06/13/2023 1923   BASOSABS 0.1 06/13/2023 1923    BMET    Component Value Date/Time   NA 141 06/13/2023 1935   K 3.4 (L) 06/13/2023 1935   CL 102 06/13/2023 1935   CO2 28 06/13/2023 1923   GLUCOSE 118 (H) 06/13/2023 1935   BUN 16 06/13/2023 1935    CREATININE 0.90 06/13/2023 1935   CALCIUM 9.6 06/13/2023 1923   GFRNONAA >60 06/13/2023 1923    IMAGING past 24 hours VAS [place] TRANSCRANIAL DOPPLER W BUBBLES  Result Date: 06/15/2023  Transcranial Doppler with Bubble Patient Name:  Rebekah Mendez  Date of Exam:   06/14/2023 Medical Rec #: [id number]       Accession #:    [id number] Date of Birth: 11-Dec-1960      Patient Gender: F Patient Age:   51 years Exam Location:  [facility] Procedure:      VAS [place] TRANSCRANIAL DOPPLER W BUBBLES Referring Phys: Hetty Blend --------------------------------------------------------------------------------  Indications: Stroke. Limitations for diagnostic windows: Unable to insonate right transtemporal window. Unable to insonate left transtemporal window. Comparison Study: No prior study Performing Technologist: Sherren Kerns RVS  Examination Guidelines: A complete evaluation includes B-mode imaging, spectral Doppler, color Doppler, and power Doppler as needed of all accessible portions of each vessel. Bilateral testing is considered an integral part of a complete examination. Limited examinations for reoccurring indications may be performed as noted.  Summary: No HITS at rest or during Valsalva. Negative transcranial Doppler Bubble study with no evidence of right to left intracardiac communication.  *See table(s) above for TCD measurements and observations.    Preliminary     Vitals:   06/16/23 0500 06/16/23 0506 06/16/23 0600 06/16/23 0746  BP: (!) 152/74 (!) 142/67 (!) 147/76   Pulse: 70  75 68   Resp: 11 14 14    Temp:    97.9 F (36.6 C)  TempSrc:    Oral  SpO2: 94% 94% 95%   Weight:      Height:         PHYSICAL EXAM General:  Alert, well-nourished, well-developed patient in no acute distress Psych:  Mood and affect appropriate for situation CV: Regular rate and rhythm on monitor Respiratory:  Regular, unlabored respirations on room air  NEURO:  Mental Status: AA&Ox3, patient is able to  give clear and coherent history Speech/Language: speech is without dysarthria or aphasia.  Naming, repetition, fluency, and comprehension intact.  Cranial Nerves:  II: PERRL. Visual fields full.  III, IV, VI: EOMI. Eyelids elevate symmetrically.  V: Sensation decreased to light touch on left side  VII: Minimal left sided asymmetry  VIII: hearing intact to voice. IX, X: Palate elevates symmetrically. Phonation is normal.  WG:NFAOZHYQ shrug 5/5. XII: tongue is midline without fasciculations. Motor: LUE and LLE 4/5 strength, better than prior  5/5 of RUE and RLE. 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 ASPECTS 10.    CTA head & neck  No LVO or significant stenosis Beading of bilateral mid ICAs, often seen in fibromuscular dysplasia   MRI   1.6cm acute ischemic non-hemorrhagic posterior right basal ganglia infarct Small remote hemorrhagic lacunar infarct right lentiform nucleus   2D Echo: EF 70-75%. LV hyperdynamic and no wall motion abnormalities.  Carotid US: Non significant plaque noted in mid to distal R ICA and L ICA.  No evidence of stenosis.   TCD w/Bubble Study: Negative  [place] LE DVT: No evidence of DVT  ANA labs: PENDING LDL 111 HgbA1c 5.6   VTE prophylaxis - SCDs and Lovenox  No hemorrhage on CT  Continue ASA and plavix for 3 weeks, followed by aspirin monotherapy.  Therapy recommendations:  CIR, awaiting full assessment  OOB as tolerable and ambulation  Disposition:  pending   Hypertension Home meds:  none Continue amlodipine 5mg  Hydralazine 20mg  IV Q6H PRN added Mildly hypertensive  to 150s, BP at goal  Blood Pressure Goal: BP less than 180/105    Hyperlipidemia Home meds:  none LDL 111, goal < 70 Add Crestor 20mg   Continue statin at discharge   Diabetes type II , no history Home meds:  none HgbA1c 5.6, goal < 7.0 CBGs SSI Recommend close follow-up with PCP   Tobacco Abuse Current cigarette smoker       Ready to quit? Yes Nicotine replacement therapy ordered and cessation education provided   Other Stroke Risk Factors Obesity, Body mass index is 38.6 kg/m., BMI >/= 30 associated with increased stroke risk, recommend weight loss, diet and exercise as appropriate  ?Obstructive sleep apnea Patient consented to participate in the sleep smart study and tested positive for obstructive sleep apnea. Tolerated CPAP overnight and will recommend at discharge.    Other Active Problems Poor Medical Management/Does not visit doctor regularly TOC consult placed for assistance with finding a PCP C/o Muscle Spasms and pain Extra-strength tylenol added PRN Robaxin 500 TID added PRN  Constipation Senna prn ordered  Anxiety Xanax 0.25 BID added PRN  Hospital day # 3  I have personally obtained history,examined this patient, reviewed notes, independently viewed imaging studies, participated in medical decision making and plan of care.ROS completed by me personally and pertinent positives fully documented  I have made any additions or clarifications directly to the above note. Agree with note above.  Patient continues to improve but still is not safe to go home and therapist recommend inpatient rehab.  She did qualify for the sleep smart study and was randomized to the CPAP treatment arm.  Continue mobilization out of bed.  Therapy is.  Transfer to rehab when bed available.  Long discussion patient and daughter at the bedside and answered questions.  Greater than 50% time during this 50-minute visit was spent in counseling and coordination of care and discussion patient care  team and answering questions.  Delia Heady, MD Medical Director [facility] Pager: [phone] 06/16/2023 2:19 PM  To contact Stroke Continuity provider, please refer to [email or web address]. After hours, contact General Neurology

## 2023-06-16 NOTE — Progress Notes (Signed)
Inpatient Rehab Admissions Coordinator:  Spoke with pt's husband on the phone. Explained CIR goals and expectations. Also discussed cost of CIR for an uninsured pt. He acknowledged understanding. He is concerned about the cost of CIR. He confirmed that pt will have 24/7 support provided pt family and friends. He is supportive of pt pursuing CIR. Gave pt Self Pay Estimate. Will continue to follow.  Wolfgang Phoenix, MS, CCC-SLP Admissions Coordinator [phone]

## 2023-06-16 NOTE — TOC Initial Note (Signed)
Transition of Care [facility]) - Initial/Assessment Note    Patient Details  Name: Rebekah Mendez MRN: [id number] Date of Birth: 06-Jan-1961  Transition of Care [facility]) CM/SW Contact:    Mearl Latin, LCSW Phone Number: 06/16/2023, 2:16 PM  Clinical Narrative:                 CSW received call from Dorene Sorrow stating he was given CSW's number to ask about resources for patient. CSW Neurosurgeon Counseling to ask for a Medicaid screening. CIR currently assessing patient; CSW explained process to [place].   Expected Discharge Plan: IP Rehab Facility Barriers to Discharge: Continued Medical Work up, Inadequate or no insurance   Patient Goals and CMS Choice            Expected Discharge Plan and Services                                              Prior Living Arrangements/Services     Patient language and need for interpreter reviewed:: Yes        Need for Family Participation in Patient Care: Yes (Comment) Care giver support system in place?: Yes (comment)   Criminal Activity/Legal Involvement Pertinent to Current Situation/Hospitalization: No - Comment as needed  Activities of Daily Living   ADL Screening (condition at time of admission) Independently performs ADLs?: No Does the patient have a NEW difficulty with bathing/dressing/toileting/self-feeding that is expected to last >3 days?: Yes (Initiates electronic notice to provider for possible OT consult) Does the patient have a NEW difficulty with getting in/out of bed, walking, or climbing stairs that is expected to last >3 days?: Yes (Initiates electronic notice to provider for possible PT consult) Does the patient have a NEW difficulty with communication that is expected to last >3 days?: No Is the patient deaf or have difficulty hearing?: No Does the patient have difficulty seeing, even when wearing glasses/contacts?: No Does the patient have difficulty concentrating, remembering, or making decisions?:  No  Permission Sought/Granted                  Emotional Assessment Appearance:: Appears stated age     Orientation: : Oriented to Self, Oriented to Place, Oriented to  Time, Oriented to Situation   Psych Involvement: No (comment)  Admission diagnosis:  Stroke (cerebrum) (HCC) [I63.9] Hypertensive emergency [I16.1] Cerebrovascular accident (CVA), unspecified mechanism (HCC) [I63.9] Patient Active Problem List   Diagnosis Date Noted   Stroke (cerebrum) (HCC) 06/13/2023   PCP:  Elie Confer, NP Pharmacy:   [id number] - [place], [place] - [address] [place] [address] Phone: [phone] Fax: [phone]     Social Determinants of Health (SDOH) Social History: SDOH Screenings   Food Insecurity: No Food Insecurity (06/15/2023)  Housing: Patient Declined (06/15/2023)  Transportation Needs: No Transportation Needs (06/15/2023)  Utilities: Not At Risk (06/15/2023)  Tobacco Use: High Risk (06/13/2023)   SDOH Interventions:     Readmission Risk Interventions     No data to display

## 2023-06-16 NOTE — Progress Notes (Signed)
Physical Therapy Treatment Patient Details Name: Rebekah Mendez MRN: [id number] DOB: 1961-03-11 Today's Date: 06/16/2023   History of Present Illness Patient is 62 y.o. female who presented to ED on 06/13/23 w/ sudden onset of Lt sided weakness/numbness. Code Stoke activated, TNK administered at [facility] and transferred to [facility]. 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 Small remote hemorrhagic lacunar infarct at the right lentiform nucleus. PMH significant for decreased thyroid activity.    PT Comments  Pt reporting feeling better today, no n/v throughout session. Pt demonstrating good mobility progression this date, tolerating 5+ sit<>stand transfers, pre-gait tasks, and short distance room gait. Pt overall requiring min +2 assist for mobility, no overt LLE buckling but pt is fearful of LLE fatiguing/buckling. Min lightheadedness with mobility with periodic eye closing, VSS throughout. Pt remains a great inpatient rehab >3 hours/day candidate.      If plan is discharge home, recommend the following: A little help with bathing/dressing/bathroom;A lot of help with walking and/or transfers   Can travel by private vehicle        Equipment Recommendations  Other (comment) (defer)    Recommendations for Other Services       Precautions / Restrictions Precautions Precautions: Fall Precaution Comments: BP <180/105 Restrictions Weight Bearing Restrictions: No     Mobility  Bed Mobility Overal bed mobility: Needs Assistance Bed Mobility: Supine to Sit     Supine to sit: Supervision     General bed mobility comments: cues for seqeuncing to EOB, pt using RUE to move LUE to EOB. Increased time    Transfers Overall transfer level: Needs assistance Equipment used: Rolling walker (2 wheels) Transfers: Sit to/from Stand, Bed to chair/wheelchair/BSC Sit to Stand: Min assist   Step pivot transfers: Min assist        General transfer comment: assist for rise and steady, pt using RUE to place LUE on RW. stand x5, from EOB x2 recliner x2 and toilet x1.    Ambulation/Gait Ambulation/Gait assistance: Min assist, +2 safety/equipment Gait Distance (Feet): 8 Feet Assistive device: Rolling walker (2 wheels) Gait Pattern/deviations: Step-through pattern, Decreased step length - left, Decreased stance time - left, Trunk flexed, Narrow base of support Gait velocity: decr     General Gait Details: assist to steady, guard LLE and keep LUE on RW, cues for upright posture and increasing foot clearance LLE.   Stairs             Wheelchair Mobility     Tilt Bed    Modified Rankin (Stroke Patients Only)       Balance Overall balance assessment: Needs assistance Sitting-balance support: Feet supported, Bilateral upper extremity supported Sitting balance-Leahy Scale: Fair     Standing balance support: Bilateral upper extremity supported, During functional activity, Reliant on assistive device for balance Standing balance-Leahy Scale: Poor Standing balance comment: standing marches x5, lateral steping bilat, weight shifting L/R as pregait                            Cognition Arousal: Alert Behavior During Therapy: WFL for tasks assessed/performed, Anxious Overall Cognitive Status: Impaired/Different from baseline Area of Impairment: Safety/judgement, Following commands                       Following Commands: Follows one step commands consistently, Follows one step commands with increased time Safety/Judgement: Decreased awareness of safety Awareness: Emergent Problem Solving:  Slow processing, Decreased initiation, Difficulty sequencing, Requires verbal cues, Requires tactile cues General Comments: fearful of falling and LLE buckling, no evidence of buckling this date. step-by-step cuing for safety, periods of eye closing with mobility requiring cues for opening eyes         Exercises      General Comments        Pertinent Vitals/Pain Pain Assessment Pain Assessment: No/denies pain    Home Living                          Prior Function            PT Goals (current goals can now be found in the care plan section) Acute Rehab PT Goals Patient Stated Goal: regain independence PT Goal Formulation: With patient Time For Goal Achievement: 06/28/23 Potential to Achieve Goals: Good Progress towards PT goals: Progressing toward goals    Frequency    Min 1X/week      PT Plan      Co-evaluation              AM-PAC PT "6 Clicks" Mobility   Outcome Measure  Help needed turning from your back to your side while in a flat bed without using bedrails?: A Little Help needed moving from lying on your back to sitting on the side of a flat bed without using bedrails?: A Little Help needed moving to and from a bed to a chair (including a wheelchair)?: A Lot Help needed standing up from a chair using your arms (e.g., wheelchair or bedside chair)?: A Lot Help needed to walk in hospital room?: A Lot Help needed climbing 3-5 steps with a railing? : Total 6 Click Score: 13    End of Session Equipment Utilized During Treatment: Gait belt Activity Tolerance: Patient tolerated treatment well Patient left: in chair;with call bell/phone within reach;with nursing/sitter in room;Other (comment) (up in bathroom washing up at sink with RN, chair alarm pad placed in recliner) Nurse Communication: Mobility status PT Visit Diagnosis: Muscle weakness (generalized) (M62.81);Difficulty in walking, not elsewhere classified (R26.2);Other abnormalities of gait and mobility (R26.89);Other symptoms and signs involving the nervous system (R29.898)     Time: 9562-1308 PT Time Calculation (min) (ACUTE ONLY): 36 min  Charges:    $Gait Training: 8-22 mins $Therapeutic Activity: 8-22 mins PT General Charges $$ ACUTE PT VISIT: 1 Visit                      Marye Round, PT DPT Acute Rehabilitation Services Secure Chat Preferred  Office [phone]    Rebekah Mendez E Stroup 06/16/2023, 11:24 AM

## 2023-06-16 NOTE — Plan of Care (Signed)
  Problem: Ischemic Stroke/TIA Tissue Perfusion: Goal: Complications of ischemic stroke/TIA will be minimized Outcome: Progressing   Problem: Self-Care: Goal: Verbalization of feelings and concerns over difficulty with self-care will improve Outcome: Progressing Goal: Ability to communicate needs accurately will improve Outcome: Progressing   Problem: Nutrition: Goal: Dietary intake will improve Outcome: Not Progressing

## 2023-06-17 ENCOUNTER — Inpatient Hospital Stay (HOSPITAL_COMMUNITY)
Admission: AD | Admit: 2023-06-17 | Discharge: 2023-06-26 | DRG: 057 | Disposition: A | Discharge status: Home / Self Care | Payer: Self-pay | Source: Intra-hospital | Attending: Physical Medicine & Rehabilitation | Admitting: Physical Medicine & Rehabilitation

## 2023-06-17 ENCOUNTER — Other Ambulatory Visit: Payer: Self-pay

## 2023-06-17 ENCOUNTER — Encounter (HOSPITAL_COMMUNITY): Payer: Self-pay | Admitting: Physical Medicine & Rehabilitation

## 2023-06-17 ENCOUNTER — Encounter (HOSPITAL_COMMUNITY): Payer: Self-pay | Admitting: Neurology

## 2023-06-17 DIAGNOSIS — K5900 Constipation, unspecified: Secondary | ICD-10-CM | POA: Diagnosis present

## 2023-06-17 DIAGNOSIS — I639 Cerebral infarction, unspecified: Principal | ICD-10-CM | POA: Diagnosis present

## 2023-06-17 DIAGNOSIS — Z7902 Long term (current) use of antithrombotics/antiplatelets: Secondary | ICD-10-CM

## 2023-06-17 DIAGNOSIS — F17200 Nicotine dependence, unspecified, uncomplicated: Secondary | ICD-10-CM | POA: Diagnosis present

## 2023-06-17 DIAGNOSIS — Z79899 Other long term (current) drug therapy: Secondary | ICD-10-CM

## 2023-06-17 DIAGNOSIS — Z7982 Long term (current) use of aspirin: Secondary | ICD-10-CM

## 2023-06-17 DIAGNOSIS — R4781 Slurred speech: Secondary | ICD-10-CM | POA: Diagnosis present

## 2023-06-17 DIAGNOSIS — I69322 Dysarthria following cerebral infarction: Secondary | ICD-10-CM

## 2023-06-17 DIAGNOSIS — R7303 Prediabetes: Secondary | ICD-10-CM | POA: Diagnosis present

## 2023-06-17 DIAGNOSIS — I6381 Other cerebral infarction due to occlusion or stenosis of small artery: Secondary | ICD-10-CM

## 2023-06-17 DIAGNOSIS — I10 Essential (primary) hypertension: Secondary | ICD-10-CM | POA: Insufficient documentation

## 2023-06-17 DIAGNOSIS — R29810 Facial weakness: Secondary | ICD-10-CM | POA: Diagnosis present

## 2023-06-17 DIAGNOSIS — R1310 Dysphagia, unspecified: Secondary | ICD-10-CM | POA: Diagnosis present

## 2023-06-17 DIAGNOSIS — F54 Psychological and behavioral factors associated with disorders or diseases classified elsewhere: Secondary | ICD-10-CM

## 2023-06-17 DIAGNOSIS — Z6838 Body mass index (BMI) 38.0-38.9, adult: Secondary | ICD-10-CM

## 2023-06-17 DIAGNOSIS — G4733 Obstructive sleep apnea (adult) (pediatric): Secondary | ICD-10-CM | POA: Diagnosis present

## 2023-06-17 DIAGNOSIS — E785 Hyperlipidemia, unspecified: Secondary | ICD-10-CM | POA: Diagnosis present

## 2023-06-17 DIAGNOSIS — Z800 Family history of malignant neoplasm of digestive organs: Secondary | ICD-10-CM

## 2023-06-17 DIAGNOSIS — I69354 Hemiplegia and hemiparesis following cerebral infarction affecting left non-dominant side: Principal | ICD-10-CM

## 2023-06-17 DIAGNOSIS — E669 Obesity, unspecified: Secondary | ICD-10-CM | POA: Diagnosis present

## 2023-06-17 LAB — BASIC METABOLIC PANEL
Anion gap: 9 (ref 5–15)
BUN: 14 mg/dL (ref 6–20)
CO2: 23 mmol/L (ref 22–32)
Calcium: 8.5 mg/dL — ABNORMAL LOW (ref 8.9–10.3)
Chloride: 106 mmol/L (ref 98–111)
Creatinine, Ser: 0.64 mg/dL (ref 0.44–1.00)
GFR, Estimated: >60 mL/min (ref >60)
Glucose, Bld: 101 mg/dL — ABNORMAL HIGH (ref 70–99)
Potassium: 3.5 mmol/L (ref 3.5–5.1)
Sodium: 138 mmol/L (ref 135–145)

## 2023-06-17 LAB — CBC
HCT: 37.5 % (ref 36.0–46.0)
Hemoglobin: 12 g/dL (ref 12.0–15.0)
MCH: 27.6 pg (ref 26.0–34.0)
MCHC: 32 g/dL (ref 30.0–36.0)
MCV: 86.4 fL (ref 80.0–100.0)
Platelets: 256 10*3/uL (ref 150–400)
RBC: 4.34 MIL/uL (ref 3.87–5.11)
RDW: 13.2 % (ref 11.5–15.5)
WBC: 6.9 10*3/uL (ref 4.0–10.5)
nRBC: 0 % (ref 0.0–0.2)

## 2023-06-17 LAB — MAGNESIUM: Magnesium: 2 mg/dL (ref 1.7–2.4)

## 2023-06-17 MED ORDER — AMLODIPINE BESYLATE 10 MG PO TABS
10.0000 mg | ORAL_TABLET | Freq: Every day | ORAL | Status: DC
  Administered 2023-06-18 – 2023-06-26 (×9): 10 mg via ORAL
  Filled 2023-06-17 (×9): qty 1

## 2023-06-17 MED ORDER — ASPIRIN 81 MG PO TBEC
81.0000 mg | DELAYED_RELEASE_TABLET | Freq: Every day | ORAL | Status: DC
  Administered 2023-06-18 – 2023-06-26 (×9): 81 mg via ORAL
  Filled 2023-06-17 (×9): qty 1

## 2023-06-17 MED ORDER — MELATONIN 3 MG PO TABS
3.0000 mg | ORAL_TABLET | Freq: Every day | ORAL | Status: DC
  Administered 2023-06-17 – 2023-06-25 (×9): 3 mg via ORAL
  Filled 2023-06-17 (×9): qty 1

## 2023-06-17 MED ORDER — ASPIRIN 81 MG PO TBEC: 81.0000 mg | DELAYED_RELEASE_TABLET | Freq: Every day | ORAL | Status: DC

## 2023-06-17 MED ORDER — NICOTINE 7 MG/24HR TD PT24: 7.0000 mg | MEDICATED_PATCH | Freq: Every day | TRANSDERMAL | Status: DC

## 2023-06-17 MED ORDER — CLOPIDOGREL BISULFATE 75 MG PO TABS
75.0000 mg | ORAL_TABLET | Freq: Every day | ORAL | Status: DC
  Administered 2023-06-18 – 2023-06-26 (×9): 75 mg via ORAL
  Filled 2023-06-17 (×9): qty 1

## 2023-06-17 MED ORDER — PANTOPRAZOLE SODIUM 40 MG PO TBEC
40.0000 mg | DELAYED_RELEASE_TABLET | Freq: Every day | ORAL | Status: DC
  Administered 2023-06-17 – 2023-06-25 (×9): 40 mg via ORAL
  Filled 2023-06-17 (×9): qty 1

## 2023-06-17 MED ORDER — ENOXAPARIN SODIUM 40 MG/0.4ML IJ SOSY: 40.0000 mg | PREFILLED_SYRINGE | INTRAMUSCULAR | Status: DC

## 2023-06-17 MED ORDER — METHOCARBAMOL 500 MG PO TABS: 500.0000 mg | ORAL_TABLET | Freq: Three times a day (TID) | ORAL | Status: DC | PRN

## 2023-06-17 MED ORDER — SENNOSIDES-DOCUSATE SODIUM 8.6-50 MG PO TABS
1.0000 | ORAL_TABLET | Freq: Two times a day (BID) | ORAL | Status: DC
  Administered 2023-06-17 – 2023-06-19 (×3): 1 via ORAL
  Filled 2023-06-17 (×6): qty 1

## 2023-06-17 MED ORDER — ROSUVASTATIN CALCIUM 20 MG PO TABS: 20.0000 mg | ORAL_TABLET | Freq: Every day | ORAL | Status: DC

## 2023-06-17 MED ORDER — ENOXAPARIN SODIUM 40 MG/0.4ML IJ SOSY
40.0000 mg | PREFILLED_SYRINGE | INTRAMUSCULAR | Status: DC
  Administered 2023-06-17 – 2023-06-25 (×9): 40 mg via SUBCUTANEOUS
  Filled 2023-06-17 (×9): qty 0.4

## 2023-06-17 MED ORDER — NICOTINE 7 MG/24HR TD PT24
7.0000 mg | MEDICATED_PATCH | Freq: Every day | TRANSDERMAL | Status: DC
  Filled 2023-06-17: qty 1

## 2023-06-17 MED ORDER — CLOPIDOGREL BISULFATE 75 MG PO TABS: 75.0000 mg | ORAL_TABLET | Freq: Every day | ORAL | Status: DC

## 2023-06-17 MED ORDER — ROSUVASTATIN CALCIUM 20 MG PO TABS
20.0000 mg | ORAL_TABLET | Freq: Every day | ORAL | Status: DC
  Administered 2023-06-18 – 2023-06-26 (×9): 20 mg via ORAL
  Filled 2023-06-17 (×9): qty 1

## 2023-06-17 MED ORDER — AMLODIPINE BESYLATE 10 MG PO TABS: 10.0000 mg | ORAL_TABLET | Freq: Every day | ORAL | Status: DC

## 2023-06-17 MED ORDER — ACETAMINOPHEN 325 MG PO TABS: 325.0000 mg | ORAL_TABLET | ORAL | Status: DC | PRN

## 2023-06-17 MED ORDER — POLYETHYLENE GLYCOL 3350 17 G PO PACK
17.0000 g | PACK | Freq: Every day | ORAL | Status: DC
  Filled 2023-06-17: qty 1

## 2023-06-17 MED ORDER — ALPRAZOLAM 0.25 MG PO TABS: 0.2500 mg | ORAL_TABLET | Freq: Two times a day (BID) | ORAL | Status: DC | PRN

## 2023-06-17 NOTE — Discharge Summary (Signed)
Physician Discharge Summary  Patient ID: Rebekah Mendez MRN: [id number] DOB/AGE: 02-24-1961 62 y.o.  Admit date: 06/17/2023 Discharge date: TBD  Discharge Diagnoses:  Principal Problem:   Infarction of right basal ganglia (HCC) Active problems: Functional deficits secondary to cerebrovascular accident of right basal ganglia Pretension Hyperlipidemia Morbid obesity Tobacco use Constipation Prediabetes  Discharged Condition: {condition:18240}  Significant Diagnostic Studies:  Labs:  Basic Metabolic Panel: Recent Labs  Lab 06/13/23 1923 06/13/23 1935 06/14/23 0220 06/17/23 0452 06/18/23 0518  NA 136 141  --  138 139  K 3.1* 3.4*  --  3.5 3.7  CL 97* 102  --  106 105  CO2 28  --   --  23 24  GLUCOSE 124* 118*  --  101* 104*  BUN 16 16  --  14 14  CREATININE 0.85 0.90  --  0.64 0.63  CALCIUM 9.6  --   --  8.5* 8.7*  MG  --   --  2.1 2.0  --   PHOS  --   --  4.0  --   --     CBC: Recent Labs  Lab 06/13/23 1923 06/13/23 1935 06/17/23 1330 06/18/23 0518  WBC 9.5  --  6.9 6.3  NEUTROABS 5.5  --   --  3.4  HGB 12.6 13.6 12.0 12.2  HCT 39.7 40.0 37.5 38.0  MCV 87.3  --  86.4 85.2  PLT 299  --  256 256    CBG: Recent Labs  Lab 06/13/23 1930  GLUCAP 132*    Brief HPI:   Rebekah Mendez is a 62 y.o. female with unremarkable past medical history except tobacco use. On no prescription medications and has not seen a physician in quite some time. Per chart review patient lives with spouse. 1 level home one-step to entry. Independent prior to admission working full-time. Presented 06/14/2023 with acute onset of left-sided weakness along with slurred speech. Noted blood pressure elevated systolic 225 on arrival. Cranial CT scan negative. Patient did receive TNK. CT head and neck no intracranial large vessel occlusion or significant stenosis. 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 Small remote  hemorrhagic lacunar infarct at the right lentiform nucleus. Admission chemistries unremarkable except potassium 3.1, glucose 124, urine drug screen negative. Echocardiogram with ejection fraction of 70 to 75% no wall motion abnormalities. Neurology follow-up placed on low-dose aspirin and Plavix 75 mg daily x 3 weeks then aspirin alone. Lovenox added for DVT prophylaxis and lower extremity Dopplers negative for DVT. She is tolerating a regular consistency diet.    Hospital Course: Rebekah Mendez was admitted to rehab 06/17/2023 for inpatient therapies to consist of PT, ST and OT at least three hours five days a week. Past admission physiatrist, therapy team and rehab RN have worked together to provide customized collaborative inpatient rehab.  Patient resting hand splint ordered.  Diet adjusted to dysphagia 3 with thin liquids and SLP evaluation obtained.   Blood pressures were monitored on TID basis and Norvasc 10 mg daily continued.  Diabetes has been monitored with ac/hs CBG checks and SSI was use prn for tighter BS control.    Rehab course: During patient's stay in rehab weekly team conferences were held to monitor patient's progress, set goals and discuss barriers to discharge. At admission, patient required min assist with mobility, min assist with basic self-care skills.  She has had improvement in activity tolerance, balance, postural control as well as ability to  compensate for deficits. She has had improvement in functional use RUE/LUE  and RLE/LLE as well as improvement in awareness       Disposition:  There are no questions and answers to display.         Diet: Carb modified  Special Instructions: No driving, alcohol consumption or tobacco use.  Aspirin 81 mg daily and Plavix 75 mg daily x 3 weeks then aspirin alone (start date 11/03)   Allergies as of 06/20/2023   No Known Allergies   Med Rec must be completed prior to using this [facility]***       Follow-up  Information     Fanny Dance, MD Follow up.   Specialty: Physical Medicine and Rehabilitation Contact information: [address] [place] [address] [phone]         Elie Confer, NP Follow up.   Contact information: [address] [place] [address] [phone]         [facility] Follow up.   Contact information: [address]     [address] [place] [address] [phone]        Marcine Matar, MD Follow up on 07/04/2023.   Specialty: Internal Medicine Why: Appointment @ 9:30 AM be there at 9:15 am Contact information: [address] [place] [address] [phone]                 Signed: Milinda Antis 06/20/2023, 4:26 PM

## 2023-06-17 NOTE — Progress Notes (Signed)
Patient ID: Rebekah Mendez, female   DOB: [birth year], 62 y.o.   MRN: [id number] Met with the patient and friend Darl Pikes) to review current situation, rehab process, team conference and plan of care. Discussed secondary risks including HTN, HLD (LDL 111/Trig 78) and OSA with smoking.  Reviewed medications including DAPT x 3 weeks then ASA solo along with dietary modification recommendations.  Patient on theSleep Smart Study and has the CPAP in the room. Reviewed MyChart access and note no PCP; set up appointment with [facility] Nov. 22, 2024; at 0930 with Dr. Jonah Blue. Patient given information on [facility], access number and appointment information. Continue to follow along to address educational needs to facilitate preparation for discharge. Pamelia Hoit

## 2023-06-17 NOTE — Progress Notes (Signed)
Inpatient Rehabilitation Admission Medication Review by a Pharmacist  A complete drug regimen review was completed for this patient to identify any potential clinically significant medication issues.  High Risk Drug Classes Is patient taking? Indication by Medication  Antipsychotic No   Anticoagulant Yes Lovenox - DVT px  Antibiotic No   Opioid No   Antiplatelet Yes ASA/plavix - CVA  Hypoglycemics/insulin No   Vasoactive Medication Yes Norvasc - HTN  Chemotherapy No   Other No Xanax - anxiety Melatonin - sleep Robaxin - spasms Nicotine patch - tobacco Protonix - GERD Crestor - HLD     Type of Medication Issue Identified Description of Issue Recommendation(s)  Drug Interaction(s) (clinically significant)     Duplicate Therapy     Allergy     No Medication Administration End Date     Incorrect Dose     Additional Drug Therapy Needed     Significant med changes from prior encounter (inform family/care partners about these prior to discharge).    Other       Clinically significant medication issues were identified that warrant physician communication and completion of prescribed/recommended actions by midnight of the next day:  No  Name of provider notified for urgent issues identified:   Provider Method of Notification:     Pharmacist comments:   Time spent performing this drug regimen review (minutes):  20   Ulyses Southward, PharmD, [place], AAHIVP, CPP Infectious Disease Pharmacist 06/17/2023 12:05 PM

## 2023-06-17 NOTE — H&P (Signed)
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  On no prescription medications and has not seen a physician in quite some time.  Per chart review patient lives with spouse.  1 level home one-step to entry.  Independent prior to admission working full-time.  Presented 06/14/2023 with acute onset of left-sided weakness along with slurred speech.  Noted blood pressure elevated systolic 225 on arrival.  Cranial CT scan negative.  Patient did receive TNK.  CT head and neck no intracranial large vessel occlusion or significant stenosis.  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  Small remote hemorrhagic lacunar infarct at the right lentiform nucleus.  Admission chemistries unremarkable except potassium 3.1, glucose 124, urine drug screen negative.  Echocardiogram with ejection fraction of 70 to 75% no wall motion abnormalities.  Neurology follow-up placed on low-dose aspirin and Plavix 75 mg daily x 3 weeks then aspirin alone.  Lovenox added for DVT prophylaxis and lower extremity Dopplers negative for DVT.  She is tolerating a regular consistency diet.  Therapy evaluations completed due to patient decreased functional mobility left-sided weakness was admitted for a comprehensive rehab program.   Pt reports her main issue is weakness- and a little/slurred speech.  Has knot in L AC fossa from infiltrated IV- painful, using hot compresses, but otherwise, doing well. Did have some HA's but not today.  Urinating in toilet/BSC LBM yesterday- continent  Review of Systems  Constitutional:  Negative for chills and fever.  HENT:  Negative for hearing loss.   Eyes:  Negative for blurred vision and double vision.  Respiratory:  Negative for cough,  shortness of breath and wheezing.   Cardiovascular:  Negative for chest pain, palpitations and leg swelling.  Gastrointestinal:  Negative for constipation, heartburn, nausea and vomiting.  Genitourinary:  Negative for dysuria, flank pain and hematuria.  Musculoskeletal:  Positive for myalgias.  Skin:  Negative for rash.  Neurological:  Positive for speech change and weakness.  Endo/Heme/Allergies:  Bruises/bleeds easily.  Psychiatric/Behavioral:  Positive for depression.   All other systems reviewed and are negative.  Past Medical History:  Diagnosis Date   Thyroid activity decreased    late 20's   Past Surgical History:  Procedure Laterality Date   CESAREAN SECTION     Feb '89   EXCISIONAL HEMORRHOIDECTOMY     Family History  Problem Relation Age of Onset   Colon cancer Neg Hx    Social History:  reports that she has been smoking. She has never used smokeless tobacco. She reports current alcohol use. She reports that she does not use drugs. Allergies: No Known Allergies No medications prior to admission.      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 Increased time Transfers Overall transfer level: Needs assistance Equipment used: Rolling walker (2 wheels) Transfers: Sit to/from Stand, Bed to chair/wheelchair/BSC Sit to Stand: Min assist Bed to/from chair/wheelchair/BSC transfer type:: Step pivot Step pivot transfers: Min assist General transfer comment: assist for rise and steady, pt using RUE to place LUE on RW. stand x5, from EOB x2 recliner x2 and toilet x1. Ambulation/Gait Ambulation/Gait assistance: Min assist, +2 safety/equipment Gait Distance (Feet): 8 Feet Assistive device: Rolling walker (2 wheels) Gait Pattern/deviations: Step-through pattern, Decreased step length - left, Decreased stance time - left, Trunk flexed, Narrow base of support General Gait Details: assist to steady, guard LLE and keep LUE on RW, cues for upright posture and increasing foot clearance LLE. Gait velocity: decr    ADL: ADL Overall ADL's : Needs assistance/impaired Grooming: Minimal assistance, Sitting Upper Body Dressing : Moderate assistance, Sitting Lower Body Dressing: Maximal assistance, +2 for physical assistance, Sit to/from stand Toilet Transfer Details (indicate cue type and reason): deferred Functional mobility during ADLs: Moderate assistance, +2 for physical assistance  Cognition: Cognition Overall Cognitive Status: Impaired/Different from baseline Orientation Level: Oriented X4 Cognition Arousal: Alert Behavior During Therapy: WFL for tasks assessed/performed, Anxious Overall Cognitive Status: Impaired/Different from baseline Area of Impairment: Safety/judgement, Following commands Following Commands: Follows one step commands consistently, Follows one step commands with increased time Safety/Judgement: Decreased awareness of safety Awareness: Emergent Problem Solving: Slow processing, Decreased initiation, Difficulty sequencing, Requires verbal cues, Requires tactile cues General Comments:  fearful of falling and LLE buckling, no evidence of buckling this date. step-by-step cuing for safety, periods of eye closing with mobility requiring cues for opening eyes  Physical Exam: Blood pressure (!) 157/92, pulse 72, temperature 98.2 F (36.8 C), temperature source Oral, resp. rate 15, height 5' 4.5" (1.638 m), weight 103.6 kg, SpO2 95%. Physical Exam Vitals and nursing note reviewed. Exam conducted with a chaperone present.  Constitutional:      General: She is not in acute distress.    Appearance: Normal appearance. She is obese.     Comments: Pt sitting up in bed; appears well except slightly slurred speech, I think from sensory deficits of L side of mouth; awake, alert, appropriate, NAD- friend Darl Pikes at bedside- holding on to stuffed bear  HENT:     Head: Normocephalic and atraumatic.     Comments: Slightly slurred speech L facial sensory deficits No facial droop at rest, but evident on L side when smiling Tongue midline    Right Ear: External ear normal.     Left Ear: External ear normal.     Nose: Nose normal. No congestion.     Mouth/Throat:     Mouth: Mucous membranes are dry.     Pharynx: Oropharynx is clear. No oropharyngeal exudate.     Comments: No drooling evident Eyes:     General:        Right eye: No discharge.        Left eye: No discharge.     Extraocular Movements: Extraocular movements intact.  Cardiovascular:     Rate and Rhythm: Normal rate and regular rhythm.     Heart sounds: Normal heart sounds. No murmur heard.    No gallop.  Pulmonary:     Effort: Pulmonary effort is normal. No respiratory distress.     Breath sounds: Normal breath sounds. No wheezing, rhonchi or rales.  Abdominal:     General: Bowel sounds are normal. There is no distension.     Palpations: Abdomen is soft.     Tenderness: There is no abdominal tenderness.  Musculoskeletal:     Cervical back: Neck supple. No tenderness.     Comments: RUE 5/5 LUE biceps 4/5; Triceps 4/5;  WE 4-/5; grip 4-/5 and FA 2-/5 RLE- 5/5 LLE- proximally 4/5 and distally 4+/5   Skin:    General: Skin is warm and dry.     Comments: Knot in L AC fossa size of medium strawberry from infiltration of IV- slightly warm, but using hot packs Otherwise, no skin issues seen  Neurological:     Mental Status: She is alert and oriented to person, place, and time.     Comments: Patient is alert and makes eye contact with examiner.  Follows simple commands.  Speech is a bit dysarthric but intelligible.  Provides name age and date of birth.  Fair insight and awareness. Dysarthria appears to be from sensory deficit.  Like has a lisp L side has decreased sensation to light touch throughout  Psychiatric:        Mood and Affect: Mood normal.        Behavior: Behavior normal.     No results found for this or any previous visit (from the past 48 hour(s)). VAS [place] TRANSCRANIAL DOPPLER W BUBBLES  Result Date: 06/15/2023  Transcranial Doppler with Bubble Patient Name:  Rebekah Mendez  Date of Exam:   06/14/2023 Medical Rec #: [id number]       Accession #:    [id number] Date of Birth: [birth year]      Patient Gender: F Patient Age:   5 years Exam Location:  [facility] Procedure:      VAS [place] TRANSCRANIAL DOPPLER W BUBBLES Referring Phys: Hetty Blend --------------------------------------------------------------------------------  Indications: Stroke. Limitations for diagnostic windows: Unable to insonate right transtemporal window. Unable to insonate left transtemporal window. Comparison Study: No prior study Performing Technologist: Sherren Kerns RVS  Examination Guidelines: A complete evaluation includes B-mode imaging, spectral Doppler, color Doppler, and power Doppler as needed of all accessible portions of each vessel. Bilateral testing is considered an integral part of a complete examination. Limited examinations for reoccurring indications may be performed as noted.  Summary: No HITS at rest or during  Valsalva. Negative transcranial Doppler Bubble study with no evidence of right to left intracardiac communication.  *See table(s) above for TCD measurements and observations.    Preliminary       Blood pressure (!) 157/92, pulse 72, temperature 98.2 F (36.8 C), temperature source Oral, resp. rate 15, height 5' 4.5" (1.638 m), weight 103.6 kg, SpO2 95%.  Medical Problem List and Plan: 1. 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  -patient may  shower  -ELOS/Goals: 7-10 days supervision  2.  Antithrombotics: -DVT/anticoagulation:  Pharmaceutical: Lovenox  -antiplatelet therapy: Aspirin 81 mg daily and Plavix 75 mg daily x 3 weeks then aspirin alone (start date 11/03)  3. Pain Management: Tylenol as needed, Robaxin as needed muscle spasms  4. Mood/Behavior/Sleep: Melatonin 3 mg nightly, Xanax 0.25 mg twice daily as needed- not home medicine that I could tell- wasn't on meds at home at all.   -antipsychotic agents: N/A  5. Neuropsych/cognition: This patient is capable of making decisions on her own behalf.  6. Skin/Wound Care: Routine skin checks  7. Fluids/Electrolytes/Nutrition: Routine in and outs with follow-up chemistries  8.  Hypertension.  Norvasc 10 mg daily.  Monitor with increased mobility  9.  Hyperlipidemia.  Lipitor  10.  Morbid Obesity.  BMI 38.60.  Dietary follow-up  11.  Tobacco use.  NicoDerm patch.  Provide counseling  12.  Constipation.  MiraLAX daily, Senokot S twice daily.  13. Needs PCP- hasn't seen physician in 7= years.  16. Prediabetes- educated on diagnosis and that needs to eat carbs in moderation.       Wendi Maya, PA-C 06/17/2023   I have personally performed a face to face diagnostic evaluation of this patient and formulated the key components of the plan.  Additionally, I have personally reviewed laboratory data, imaging studies, as well as relevant notes and concur with the  physician assistant's documentation above.   The patient's status has not changed from the original H&P.  Any changes in documentation from the acute care chart have been noted above.

## 2023-06-17 NOTE — Discharge Instructions (Addendum)
Inpatient Rehab Discharge Instructions  Rebekah Mendez Discharge date and time: 06/26/2023  Activities/Precautions/ Functional Status: Activity: no lifting, driving, or strenuous exercise until cleared by MD Diet: cardiac diet Wound Care: none needed Functional status:  ___ No restrictions     ___ Walk up steps independently ___ 24/7 supervision/assistance   ___ Walk up steps with assistance _x__ Intermittent supervision/assistance  ___ Bathe/dress independently ___ Walk with walker     ___ Bathe/dress with assistance ___ Walk Independently    ___ Shower independently ___ Walk with assistance    __x_ Shower with assistance __x_ No alcohol     ___ Return to work/school ________  Special Instructions: No driving, alcohol consumption or tobacco use.   Recommend daily BP measurement in same arm and record time of day. Bring this information with you to follow-up appointment with PCP.   COMMUNITY REFERRALS UPON DISCHARGE:    Home Exercise program given to patient via PT and OT   Medical Equipment/Items Ordered:HAS ALL NEEDED EQUIPMENT                                                 Agency/Supplier:[employer]  MATCH PLACED FOR ASSIST WITH 30 DAY SUPPLY OF MEDICATIONS PCP ARRANGED VIA DR. Gavin Pound JOHNSON 11/22/@ 9:30 AM AT [facility]    STROKE/TIA DISCHARGE INSTRUCTIONS SMOKING Cigarette smoking nearly doubles your risk of having a stroke & is the single most alterable risk factor  If you smoke or have smoked in the last 12 months, you are advised to quit smoking for your health. Most of the excess cardiovascular risk related to smoking disappears within a year of stopping. Ask you doctor about anti-smoking medications [place] Quit Line: 1-800-QUIT NOW Free Smoking Cessation Classes [phone]  CHOLESTEROL Know your levels; limit fat & cholesterol in your diet  Lipid Panel     Component Value Date/Time   CHOL 178 06/14/2023 0220   TRIG 78 06/14/2023 0220    HDL 51 06/14/2023 0220   CHOLHDL 3.5 06/14/2023 0220   VLDL 16 06/14/2023 0220   LDLCALC 111 (H) 06/14/2023 0220     Many patients benefit from treatment even if their cholesterol is at goal. Goal: Total Cholesterol (CHOL) less than 160 Goal:  Triglycerides (TRIG) less than 150 Goal:  HDL greater than 40 Goal:  LDL (LDLCALC) less than 100   BLOOD PRESSURE American Stroke Association blood pressure target is less that 120/80 mm/Hg  Your discharge blood pressure is:  BP: 130/84 Monitor your blood pressure Limit your salt and alcohol intake Many individuals will require more than one medication for high blood pressure  DIABETES (A1c is a blood sugar average for last 3 months) Goal HGBA1c is under 7% (HBGA1c is blood sugar average for last 3 months)  Diabetes: No known diagnosis of diabetes    Lab Results  Component Value Date   HGBA1C 5.6 06/14/2023    Your HGBA1c can be lowered with medications, healthy diet, and exercise. Check your blood sugar as directed by your physician Call your physician if you experience unexplained or low blood sugars.  PHYSICAL ACTIVITY/REHABILITATION Goal is 30 minutes at least 4 days per week  Activity: Increase activity slowly, Therapies: Physical Therapy: Outpatient and Occupational Therapy: Outpatient Return to work: when cleared by MD Activity decreases your risk of heart attack and stroke and makes your  heart stronger.  It helps control your weight and blood pressure; helps you relax and can improve your mood. Participate in a regular exercise program. Talk with your doctor about the best form of exercise for you (dancing, walking, swimming, cycling).  DIET/WEIGHT Goal is to maintain a healthy weight  Your discharge diet is:  Diet Order             Diet Heart Room service appropriate? Yes; Fluid consistency: Thin  Diet effective now                  thin liquids Your height is:  Height: 5' 4.5" (163.8 cm) Your current weight is: Weight:  101.1 kg Your Body Mass Index (BMI) is:  BMI (Calculated): 37.68 Following the type of diet specifically designed for you will help prevent another stroke. Your goal weight range is:   Your goal Body Mass Index (BMI) is 19-24. Healthy food habits can help reduce 3 risk factors for stroke:  High cholesterol, hypertension, and excess weight.  RESOURCES Stroke/Support Group:  Call [phone]   STROKE EDUCATION PROVIDED/REVIEWED AND GIVEN TO PATIENT Stroke warning signs and symptoms How to activate emergency medical system (call 911). Medications prescribed at discharge. Need for follow-up after discharge. Personal risk factors for stroke. Pneumonia vaccine given: No Flu vaccine given: No My questions have been answered, the writing is legible, and I understand these instructions.  I will adhere to these goals & educational materials that have been provided to me after my discharge from the hospital.     My questions have been answered and I understand these instructions. I will adhere to these goals and the provided educational materials after my discharge from the hospital.  Patient/Caregiver Signature _______________________________ Date __________  Clinician Signature _______________________________________ Date __________  Please bring this form and your medication list with you to all your follow-up doctor's appointments.

## 2023-06-17 NOTE — Plan of Care (Signed)
  Problem: Ischemic Stroke/TIA Tissue Perfusion: Goal: Complications of ischemic stroke/TIA will be minimized Outcome: Not Progressing   Problem: Coping: Goal: Will verbalize positive feelings about self Outcome: Not Progressing Goal: Will identify appropriate support needs Outcome: Not Progressing   Problem: Self-Care: Goal: Ability to participate in self-care as condition permits will improve Outcome: Not Progressing   Problem: Activity: Goal: Risk for activity intolerance will decrease Outcome: Not Progressing   Problem: Safety: Goal: Ability to remain free from injury will improve Outcome: Not Progressing

## 2023-06-17 NOTE — H&P (Signed)
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  On no prescription medications and has not seen a physician in quite some time.  Per chart review patient lives with spouse.  1 level home one-step to entry.  Independent prior to admission working full-time.  Presented 06/14/2023 with acute onset of left-sided weakness along with slurred speech.  Noted blood pressure elevated systolic 225 on arrival.  Cranial CT scan negative.  Patient did receive TNK.  CT head and neck no intracranial large vessel occlusion or significant stenosis.  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  Small remote hemorrhagic lacunar infarct at the right lentiform nucleus.  Admission chemistries unremarkable except potassium 3.1, glucose 124, urine drug screen negative.  Echocardiogram with ejection fraction of 70 to 75% no wall motion abnormalities.  Neurology follow-up placed on low-dose aspirin and Plavix 75 mg daily x 3 weeks then aspirin alone.  Lovenox added for DVT prophylaxis and lower extremity Dopplers negative for DVT.  She is tolerating a regular consistency diet.  Therapy evaluations completed due to patient decreased functional mobility left-sided weakness was admitted for a comprehensive rehab program.     Pt reports her main issue is weakness- and a little/slurred speech.  Has knot in L AC fossa from infiltrated IV- painful, using hot compresses, but otherwise, doing well. Did have some HA's but not today.  Urinating in toilet/BSC LBM yesterday- continent   Review of Systems  Constitutional:  Negative for chills and fever.  HENT:  Negative for hearing loss.   Eyes:  Negative for blurred vision and double vision.  Respiratory:  Negative for  cough, shortness of breath and wheezing.   Cardiovascular:  Negative for chest pain, palpitations and leg swelling.  Gastrointestinal:  Negative for constipation, heartburn, nausea and vomiting.  Genitourinary:  Negative for dysuria, flank pain and hematuria.  Musculoskeletal:  Positive for myalgias.  Skin:  Negative for rash.  Neurological:  Positive for speech change and weakness.  Endo/Heme/Allergies:  Bruises/bleeds easily.  Psychiatric/Behavioral:  Positive for depression.   All other systems reviewed and are negative.       Past Medical History:  Diagnosis Date   Thyroid activity decreased      late 20's             Past Surgical History:  Procedure Laterality Date   CESAREAN SECTION        Feb '89   EXCISIONAL HEMORRHOIDECTOMY                 Family History  Problem Relation Age of Onset   Colon cancer Neg Hx          Social History:  reports that she has been smoking. She has never used smokeless tobacco. She reports current alcohol use. She reports that she does not use drugs. Allergies:  Allergies  No Known Allergies   No medications prior to admission.              Home: Home Living Family/patient expects to be discharged to:: Private residence Living Arrangements: Spouse/significant other Available Help at Discharge: Family, Available 24 hours/day Type of Home: House Home Access: Stairs to enter [employer] of Steps: threshold Entrance Stairs-Rails: None Home Layout: One level Bathroom Shower/Tub: Walk-in  shower Bathroom Toilet: Standard Bathroom Accessibility: Yes Home Equipment: None  Lives With: Spouse, Family   Functional History: Prior Function Prior Level of Function : Independent/Modified Independent, Driving, Working/employed ADLs 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 Increased time Transfers Overall transfer level: Needs assistance Equipment used: Rolling walker (2 wheels) Transfers: Sit to/from Stand, Bed to chair/wheelchair/BSC Sit to Stand: Min assist Bed to/from chair/wheelchair/BSC transfer type:: Step pivot Step pivot transfers: Min assist General transfer comment: assist for rise and steady, pt using RUE to place LUE on RW. stand x5, from EOB x2 recliner x2 and toilet x1. Ambulation/Gait Ambulation/Gait assistance: Min assist, +2 safety/equipment Gait Distance (Feet): 8 Feet Assistive device: Rolling walker (2 wheels) Gait Pattern/deviations: Step-through pattern, Decreased step length - left, Decreased stance time - left, Trunk flexed, Narrow base of support General Gait Details: assist to steady, guard LLE and keep LUE on RW, cues for upright posture and increasing foot clearance LLE. Gait velocity: decr   ADL: ADL Overall ADL's : Needs assistance/impaired Grooming: Minimal assistance, Sitting Upper Body Dressing : Moderate assistance, Sitting Lower Body Dressing: Maximal assistance, +2 for physical assistance, Sit to/from stand Toilet Transfer Details (indicate cue type and reason): deferred Functional mobility during ADLs: Moderate assistance, +2 for physical assistance   Cognition: Cognition Overall Cognitive Status: Impaired/Different from baseline Orientation Level: Oriented X4 Cognition Arousal: Alert Behavior During Therapy: WFL for tasks assessed/performed, Anxious Overall Cognitive Status: Impaired/Different from baseline Area of Impairment: Safety/judgement, Following commands Following Commands: Follows one step commands consistently, Follows one step commands with increased time Safety/Judgement: Decreased awareness of safety Awareness: Emergent Problem Solving: Slow processing, Decreased initiation, Difficulty  sequencing, Requires verbal cues, Requires tactile cues General Comments: fearful of falling and LLE buckling, no evidence of buckling this date. step-by-step cuing for safety, periods of eye closing with mobility requiring cues for opening eyes   Physical Exam: Blood pressure (!) 157/92, pulse 72, temperature 98.2 F (36.8 C), temperature source Oral, resp. rate 15, height 5' 4.5" (1.638 m), weight 103.6 kg, SpO2 95%. Physical Exam Vitals and nursing note reviewed. Exam conducted with a chaperone present.  Constitutional:      General: She is not in acute distress.    Appearance: Normal appearance. She is obese.     Comments: Pt sitting up in bed; appears well except slightly slurred speech, I think from sensory deficits of L side of mouth; awake, alert, appropriate, NAD- friend Darl Pikes at bedside- holding on to stuffed bear  HENT:     Head: Normocephalic and atraumatic.     Comments: Slightly slurred speech L facial sensory deficits No facial droop at rest, but evident on L side when smiling Tongue midline    Right Ear: External ear normal.     Left Ear: External ear normal.     Nose: Nose normal. No congestion.     Mouth/Throat:     Mouth: Mucous membranes are dry.     Pharynx: Oropharynx is clear. No oropharyngeal exudate.     Comments: No drooling evident Eyes:     General:        Right eye: No discharge.        Left eye: No discharge.     Extraocular Movements: Extraocular movements intact.  Cardiovascular:     Rate and Rhythm: Normal rate and regular rhythm.     Heart sounds: Normal heart sounds. No murmur heard.    No gallop.  Pulmonary:     Effort: Pulmonary effort is normal. No respiratory distress.     Breath sounds: Normal breath sounds. No wheezing, rhonchi or rales.  Abdominal:     General: Bowel sounds are normal. There is no distension.     Palpations: Abdomen is soft.     Tenderness: There is no abdominal tenderness.  Musculoskeletal:     Cervical back: Neck  supple. No tenderness.     Comments: RUE 5/5 LUE biceps 4/5; Triceps 4/5; WE 4-/5; grip 4-/5 and FA 2-/5 RLE- 5/5 LLE- proximally 4/5 and distally 4+/5   Skin:    General: Skin is warm and dry.     Comments: Knot in L AC fossa size of medium strawberry from infiltration of IV- slightly warm, but using hot packs Otherwise, no skin issues seen  Neurological:     Mental Status: She is alert and oriented to person, place, and time.     Comments: Patient is alert and makes eye contact with examiner.  Follows simple commands.  Speech is a bit dysarthric but intelligible.  Provides name age and date of birth.  Fair insight and awareness. Dysarthria appears to be from sensory deficit.  Like has a lisp L side has decreased sensation to light touch throughout  Psychiatric:        Mood and Affect: Mood normal.        Behavior: Behavior normal.        Lab Results Last 48 Hours  No results found for this or any previous visit (from the past 48 hour(s)).    Imaging Results (Last 48 hours)  VAS [place] TRANSCRANIAL DOPPLER W BUBBLES   Result Date: 06/15/2023  Transcranial Doppler with Bubble Patient Name:  BRITAINY KOZUB  Date of Exam:   06/14/2023 Medical Rec #: [id number]       Accession #:    [id number] Date of Birth: [birth year]      Patient Gender: F Patient Age:   25 years Exam Location:  [facility] Procedure:      VAS [place] TRANSCRANIAL DOPPLER W BUBBLES Referring Phys: Hetty Blend --------------------------------------------------------------------------------  Indications: Stroke. Limitations for diagnostic windows: Unable to insonate right transtemporal window. Unable to insonate left transtemporal window. Comparison Study: No prior study Performing Technologist: Sherren Kerns RVS  Examination Guidelines: A complete evaluation includes B-mode imaging, spectral Doppler, color Doppler, and power Doppler as needed of all accessible portions of each vessel. Bilateral testing is considered an  integral part of a complete examination. Limited examinations for reoccurring indications may be performed as noted.  Summary: No HITS at rest or during Valsalva. Negative transcranial Doppler Bubble study with no evidence of right to left intracardiac communication.  *See table(s) above for TCD measurements and observations.    Preliminary            Blood pressure (!) 157/92, pulse 72, temperature 98.2 F (36.8 C), temperature source Oral, resp. rate 15, height 5' 4.5" (1.638 m), weight 103.6 kg, SpO2 95%.   Medical Problem List and Plan: 1. 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             -patient may  shower             -ELOS/Goals: 7-10 days supervision  2.  Antithrombotics: -DVT/anticoagulation:  Pharmaceutical: Lovenox             -antiplatelet therapy: Aspirin 81 mg daily and Plavix 75 mg daily x 3 weeks then aspirin alone (start date 11/03)   3. Pain Management: Tylenol as needed, Robaxin as needed muscle spasms   4. Mood/Behavior/Sleep: Melatonin 3 mg nightly, Xanax 0.25 mg twice daily as needed- not home medicine that I could tell- wasn't on meds at home at all.              -antipsychotic agents: N/A   5. Neuropsych/cognition: This patient is capable of making decisions on her own behalf.   6. Skin/Wound Care: Routine skin checks   7. Fluids/Electrolytes/Nutrition: Routine in and outs with follow-up chemistries   8.  Hypertension.  Norvasc 10 mg daily.  Monitor with increased mobility   9.  Hyperlipidemia.  Lipitor   10.  Morbid Obesity.  BMI 38.60.  Dietary follow-up   11.  Tobacco use.  NicoDerm patch.  Provide counseling   12.  Constipation.  MiraLAX daily, Senokot S twice daily.   13. Needs PCP- hasn't seen physician in 7= years.   16. Prediabetes- educated on diagnosis and that needs to eat carbs in moderation.          Wendi Maya, PA-C 06/17/2023     I have personally performed a face  to face diagnostic evaluation of this patient and formulated the key components of the plan.  Additionally, I have personally reviewed laboratory data, imaging studies, as well as relevant notes and concur with the physician assistant's documentation above.   The patient's status has not changed from the original H&P.  Any changes in documentation from the acute care chart have been noted above.

## 2023-06-17 NOTE — Progress Notes (Signed)
PMR Admission Coordinator Pre-Admission Assessment   Patient: Rebekah Mendez is an 62 y.o., female MRN: [id number] DOB: 1961-06-16 Height: 5' 4.5" (163.8 cm) Weight: 103.6 kg   Insurance Information HMO:     PPO:      PCP:      IPA:      80/20:      OTHER:  PRIMARY: UNINSURED   Self Pay Estimate given on 06/16/23   Policy#:       Subscriber:  CM Name:       Phone#:      Fax#:  Pre-Cert#:       Employer:  Benefits:  Phone #:      Name:  Eff. Date:      Deduct:       Out of Pocket Max:       Life Max:  CIR:       SNF:  Outpatient:      Co-Pay:  Home Health:       Co-Pay:  DME:      Co-Pay:  Providers:  SECONDARY:       Policy#:      Phone#:    Artist:       Phone#:    The Data processing manager" for patients in Inpatient Rehabilitation Facilities with attached "Privacy Act Statement-Health Care Records" was provided and verbally reviewed with: N/A   Emergency Contact Information Contact Information   None on File      Other Contacts       Name Relation Home Work Mobile    [place]   [phone]   [phone]           Current Medical History  Patient Admitting Diagnosis: CVA History of Present Illness: Pt is a 62 year old female with no significant medical hx.  Pt presented to [facility] on [facility]/13/23 d/t sudden onset of left-sided numbness and weakness. Code stroke activated. CT head showed no acute abnormalities. CTA showed no LVO. TNK administered. Pt transferred to [facility] on 06/13/23 for further treatment. 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  Small remote hemorrhagic lacunar infarct at the right lentiform nucleus.  Admission chemistries unremarkable except potassium 3.1, glucose 124, urine drug screen negative.  Echocardiogram with ejection fraction of 70 to 75% no wall motion abnormalities.  Neurology follow-up placed on low-dose aspirin  and Plavix 75 mg daily x 3 weeks then aspirin alone.  Lovenox added for DVT prophylaxis and lower extremity Dopplers negative for DVT.  She is tolerating a regular consistency diet.   Therapy evaluations completed and CIR recommended d/t pt's deficits in functional mobility.    Complete NIHSS TOTAL: 3   Patient's medical record from [facility] has been reviewed by the rehabilitation admission coordinator and physician.   Past Medical History      Past Medical History:  Diagnosis Date   Thyroid activity decreased      late 20's          Has the patient had major surgery during 100 days prior to admission? No   Family History   family history is not on file.   Current Medications  Current Medications    Current Facility-Administered Medications:    acetaminophen (TYLENOL) tablet 1,000 mg, 1,000 mg, Oral, TID PRN, Micki Riley, MD, 1,000 mg at 06/15/23 2018   ALPRAZolam (XANAX) tablet 0.25 mg, 0.25 mg, Oral, BID PRN, Richardo Priest,  Mont Dutton, NP, 0.25 mg at 06/15/23 2020   amLODipine (NORVASC) tablet 10 mg, 10 mg, Oral, Daily, Gevena Mart A, NP, 10 mg at 06/16/23 1040   aspirin EC tablet 81 mg, 81 mg, Oral, Daily, Peterson Ao, MD, 81 mg at 06/16/23 1040   Chlorhexidine Gluconate Cloth 2 % PADS 6 each, 6 each, Topical, Daily, Caryl Pina, MD, 6 each at 06/16/23 1041   clopidogrel (PLAVIX) tablet 75 mg, 75 mg, Oral, Daily, Peterson Ao, MD, 75 mg at 06/16/23 1040   enoxaparin (LOVENOX) injection 40 mg, 40 mg, Subcutaneous, Q24H, Peterson Ao, MD, 40 mg at 06/16/23 1217   hydrALAZINE (APRESOLINE) injection 10 mg, 10 mg, Intravenous, Once, Milon Dikes, MD   hydrALAZINE (APRESOLINE) injection 20 mg, 20 mg, Intravenous, Q6H PRN, Hetty Blend C, NP, 20 mg at 06/15/23 0937   melatonin tablet 3 mg, 3 mg, Oral, QHS, Lehner, Erin C, NP, 3 mg at 06/15/23 2021   methocarbamol (ROBAXIN) tablet 500 mg, 500 mg, Oral, Q8H PRN, Hetty Blend C, NP, 500 mg at 06/15/23 2021   nicotine  (NICODERM CQ - dosed in mg/24 hr) patch 7 mg, 7 mg, Transdermal, Daily, Richardo Priest, Erin C, NP   ondansetron (ZOFRAN) injection 4 mg, 4 mg, Intravenous, Q6H PRN, Micki Riley, MD, 4 mg at 06/15/23 2130   Oral care mouth rinse, 15 mL, Mouth Rinse, PRN, Caryl Pina, MD   Oral care mouth rinse, 15 mL, Mouth Rinse, 4 times per day, Caryl Pina, MD, 15 mL at 06/16/23 1418   Oral care mouth rinse, 15 mL, Mouth Rinse, PRN, Caryl Pina, MD   pantoprazole (PROTONIX) EC tablet 40 mg, 40 mg, Oral, QHS, Sethi, Pramod S, MD   polyethylene glycol (MIRALAX / GLYCOLAX) packet 17 g, 17 g, Oral, Daily, Peterson Ao, MD   rosuvastatin (CRESTOR) tablet 20 mg, 20 mg, Oral, Daily, Richardo Priest, Erin C, NP, 20 mg at 06/16/23 1041   senna-docusate (Senokot-S) tablet 1 tablet, 1 tablet, Oral, QHS PRN, Caryl Pina, MD   senna-docusate (Senokot-S) tablet 1 tablet, 1 tablet, Oral, BID, Peterson Ao, MD, 1 tablet at 06/16/23 1040     Patients Current Diet:  Diet Order                  Diet Heart Room service appropriate? Yes; Fluid consistency: Thin  Diet effective now                         Precautions / Restrictions Precautions Precautions: Fall Precaution Comments: BP <180/105 Restrictions Weight Bearing Restrictions: No    Has the patient had 2 or more falls or a fall with injury in the past year? No   Prior Activity Level Community (5-7x/wk): gets out of house daily; drives   Prior Functional Level Self Care: Did the patient need help bathing, dressing, using the toilet or eating? Independent   Indoor Mobility: Did the patient need assistance with walking from room to room (with or without device)? Independent   Stairs: Did the patient need assistance with internal or external stairs (with or without device)? Independent   Functional Cognition: Did the patient need help planning regular tasks such as shopping or remembering to take medications? Independent   Patient Information Are you  of Hispanic, Latino/a,or Spanish origin?: A. No, not of Hispanic, Latino/a, or Spanish origin What is your race?: A. White Do you need or want an interpreter to communicate with a doctor or health care staff?: 0. No  Patient's Response To:  Health Literacy and Transportation Is the patient able to respond to health literacy and transportation needs?: Yes Health Literacy - How often do you need to have someone help you when you read instructions, pamphlets, or other written material from your doctor or pharmacy?: Never In the past 12 months, has lack of transportation kept you from medical appointments or from getting medications?: No In the past 12 months, has lack of transportation kept you from meetings, work, or from getting things needed for daily living?: No   Home Assistive Devices / Equipment Home Equipment: None   Prior Device Use: Indicate devices/aids used by the patient prior to current illness, exacerbation or injury? None of the above   Current Functional Level Cognition   Overall Cognitive Status: Impaired/Different from baseline Orientation Level: Oriented X4 Following Commands: Follows one step commands consistently, Follows one step commands with increased time Safety/Judgement: Decreased awareness of safety General Comments: fearful of falling and LLE buckling, no evidence of buckling this date. step-by-step cuing for safety, periods of eye closing with mobility requiring cues for opening eyes    Extremity Assessment (includes Sensation/Coordination)   Upper Extremity Assessment: LUE deficits/detail, Right hand dominant LUE Deficits / Details: grossly 3-/5 (weaker proximally) and decreased coordination.  Sensation WFL.  Pt report improved since yesterday! LUE Sensation: WNL LUE Coordination: decreased fine motor, decreased gross motor  Lower Extremity Assessment: Defer to PT evaluation RLE Deficits / Details: heel to shin intact, grossly 4/5 for MMT LLE Deficits /  Details: heel to shin slow and decreased acciracy, grossly 3+/5 to 4-/5 with MMT LLE Sensation: decreased light touch, decreased proprioception LLE Coordination: decreased gross motor, decreased fine motor     ADLs   Overall ADL's : Needs assistance/impaired Grooming: Minimal assistance, Sitting Upper Body Dressing : Moderate assistance, Sitting Lower Body Dressing: Maximal assistance, +2 for physical assistance, Sit to/from stand Toilet Transfer Details (indicate cue type and reason): deferred Functional mobility during ADLs: Moderate assistance, +2 for physical assistance     Mobility   Overal bed mobility: Needs Assistance Bed Mobility: Supine to Sit Supine to sit: Supervision Sit to supine: Mod assist General bed mobility comments: cues for seqeuncing to EOB, pt using RUE to move LUE to EOB. Increased time     Transfers   Overall transfer level: Needs assistance Equipment used: Rolling walker (2 wheels) Transfers: Sit to/from Stand, Bed to chair/wheelchair/BSC Sit to Stand: Min assist Bed to/from chair/wheelchair/BSC transfer type:: Step pivot Step pivot transfers: Min assist General transfer comment: assist for rise and steady, pt using RUE to place LUE on RW. stand x5, from EOB x2 recliner x2 and toilet x1.     Ambulation / Gait / Stairs / Wheelchair Mobility   Ambulation/Gait Ambulation/Gait assistance: Min assist, +2 safety/equipment Gait Distance (Feet): 8 Feet Assistive device: Rolling walker (2 wheels) Gait Pattern/deviations: Step-through pattern, Decreased step length - left, Decreased stance time - left, Trunk flexed, Narrow base of support General Gait Details: assist to steady, guard LLE and keep LUE on RW, cues for upright posture and increasing foot clearance LLE. Gait velocity: decr     Posture / Balance Balance Overall balance assessment: Needs assistance Sitting-balance support: Feet supported, Bilateral upper extremity supported Sitting balance-Leahy  Scale: Fair Standing balance support: Bilateral upper extremity supported, During functional activity, Reliant on assistive device for balance Standing balance-Leahy Scale: Poor Standing balance comment: standing marches x5, lateral steping bilat, weight shifting L/R as pregait     Special needs/care  consideration External Urinary Catheter    Previous Home Environment (from acute therapy documentation) Living Arrangements: Spouse/significant other  Lives With: Spouse, Family Available Help at Discharge: Family, Available 24 hours/day Type of Home: House Home Layout: One level Home Access: Stairs to enter Entrance Stairs-Rails: None Entrance Stairs-Number of Steps: threshold Bathroom Shower/Tub: Health visitor: Standard Bathroom Accessibility: Yes How Accessible: Accessible via walker Home Care Services: No   Discharge Living Setting Plans for Discharge Living Setting: Patient's home Type of Home at Discharge: House Discharge Home Layout: One level Discharge Home Access: Stairs to enter Entrance Stairs-Rails: None Entrance Stairs-Number of Steps: threshold Discharge Bathroom Shower/Tub: Walk-in shower Discharge Bathroom Toilet: Standard Discharge Bathroom Accessibility: Yes How Accessible: Accessible via walker Does the patient have any problems obtaining your medications?: No   Social/Family/Support Systems Anticipated Caregiver: Marjo Grosvenor (husband), Ocie Bob (daugher) & other family Anticipated Caregiver's Contact Information: Dorene Sorrow: [phone] and Ocie Bob: [phone] Caregiver Availability: 24/7 Discharge Plan Discussed with Primary Caregiver: Yes Is Caregiver In Agreement with Plan?: Yes Does Caregiver/Family have Issues with Lodging/Transportation while Pt is in Rehab?: No   Goals Patient/Family Goal for Rehab: Supervision: PT/OT Expected length of stay: 7-10 days Pt/Family Agrees to Admission and willing to participate: No Program  Orientation Provided & Reviewed with Pt/Caregiver Including Roles  & Responsibilities: No   Decrease burden of Care through IP rehab admission: NA   Possible need for SNF placement upon discharge: Not anticipated   Patient Condition: I have reviewed medical records from [facility], spoken with CM, and patient, spouse, and daughter. I met with patient at the bedside and discussed via phone for inpatient rehabilitation assessment.  Patient will benefit from ongoing PT and OT, can actively participate in 3 hours of therapy a day 5 days of the week, and can make measurable gains during the admission.  Patient will also benefit from the coordinated team approach during an Inpatient Acute Rehabilitation admission.  The patient will receive intensive therapy as well as Rehabilitation physician, nursing, social worker, and care management interventions.  Due to bladder management, safety, disease management, medication administration, pain management, and patient education the patient requires 24 hour a day rehabilitation nursing.  The patient is currently min/mod A with mobility and basic ADLs.  Discharge setting and therapy post discharge at home with home health is anticipated.  Patient has agreed to participate in the Acute Inpatient Rehabilitation Program and will admit today.   Preadmission Screen Completed By:  Domingo Pulse, 06/16/2023 3:18 PM, updates provided by Lissa Merlin, PT 06/17/2023 ______________________________________________________________________   Discussed status with Dr. Berline Chough on 06/17/23 at 9:00 am and received approval for admission today.   Admission Coordinator:  Domingo Pulse, CCC-SLP, updates provided by Lissa Merlin, PT time 10:21 am /Date 06/17/2023    Assessment/Plan: Diagnosis: R BG stroke Does the need for close, 24 hr/day Medical supervision in concert with the patient's rehab needs make it unreasonable for this patient to be served in a  less intensive setting? Yes Co-Morbidities requiring supervision/potential complications: new dx of HTN; HLD; prediabetes A1c 5.6; Lhemiparesis Due to bladder management, bowel management, safety, skin/wound care, disease management, medication administration, and patient education, does the patient require 24 hr/day rehab nursing? Yes Does the patient require coordinated care of a physician, rehab nurse, PT, OT, and SLP to address physical and functional deficits in the context of the above medical diagnosis(es)? Yes Addressing deficits in the following areas: balance, endurance, locomotion, strength, transferring, bowel/bladder control, bathing,  dressing, feeding, grooming, and toileting Can the patient actively participate in an intensive therapy program of at least 3 hrs of therapy 5 days a week? Yes The potential for patient to make measurable gains while on inpatient rehab is good Anticipated functional outcomes upon discharge from inpatient rehab: supervision PT, supervision OT, n/a SLP Estimated rehab length of stay to reach the above functional goals is: 7-10 days Anticipated discharge destination: Home 10. Overall Rehab/Functional Prognosis: good     MD Signature:

## 2023-06-17 NOTE — Progress Notes (Signed)
Inpatient Rehab Admissions Coordinator:   Patient is medically ready and we have bed available on CIR today. Met with patient/family at bedside and she is in agreement. Plan for admit to CIR.   Rehab Admissons Coordinator [place], [place], [place] [phone]

## 2023-06-17 NOTE — Discharge Summary (Addendum)
Stroke Discharge Summary  Patient ID: Rebekah Mendez   MRN: [id number]      DOB: 1960/09/11  Date of Admission: 06/13/2023 Date of Discharge: 06/17/2023  Attending Physician:  Dr. 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 Stroke etiology small vessel disease Principal Problem:   Infarction of right basal ganglia (HCC) Active Problems:   Essential hypertension   Tobacco use disorder   Obesity Obstructive Sleep apnoea Hyperlipidimia   Medications to be continued on Rehab Allergies as of 06/17/2023   No Known Allergies      Medication List     TAKE these medications    amLODipine 10 MG tablet Commonly known as: NORVASC Take 1 tablet (10 mg total) by mouth daily. Start taking on: June 18, 2023   aspirin EC 81 MG tablet Take 1 tablet (81 mg total) by mouth daily. Swallow whole. Start taking on: June 18, 2023   clopidogrel 75 MG tablet Commonly known as: PLAVIX Take 1 tablet (75 mg total) by mouth daily for 18 days. Start taking on: June 18, 2023   nicotine 7 mg/24hr patch Commonly known as: NICODERM CQ - dosed in mg/24 hr Place 1 patch (7 mg total) onto the skin daily. Start taking on: June 18, 2023   rosuvastatin 20 MG tablet Commonly known as: CRESTOR Take 1 tablet (20 mg total) by mouth daily. Start taking on: June 18, 2023        LABORATORY STUDIES CBC    Component Value Date/Time   WBC 9.5 06/13/2023 1923   RBC 4.55 06/13/2023 1923   HGB 13.6 06/13/2023 1935   HCT 40.0 06/13/2023 1935   PLT 299 06/13/2023 1923   MCV 87.3 06/13/2023 1923   MCH 27.7 06/13/2023 1923   MCHC 31.7 06/13/2023 1923   RDW 13.1 06/13/2023 1923   LYMPHSABS 3.2 06/13/2023 1923   MONOABS 0.6 06/13/2023 1923   EOSABS 0.2 06/13/2023 1923   BASOSABS 0.1 06/13/2023 1923   CMP    Component  Value Date/Time   NA 138 06/17/2023 0452   K 3.5 06/17/2023 0452   CL 106 06/17/2023 0452   CO2 23 06/17/2023 0452   GLUCOSE 101 (H) 06/17/2023 0452   BUN 14 06/17/2023 0452   CREATININE 0.64 06/17/2023 0452   CALCIUM 8.5 (L) 06/17/2023 0452   PROT 7.4 06/13/2023 1923   ALBUMIN 4.1 06/13/2023 1923   AST 19 06/13/2023 1923   ALT 20 06/13/2023 1923   ALKPHOS 71 06/13/2023 1923   BILITOT 0.2 (L) 06/13/2023 1923   GFRNONAA >60 06/17/2023 0452   COAGS Lab Results  Component Value Date   INR 0.9 06/13/2023   Lipid Panel    Component Value Date/Time   CHOL 178 06/14/2023 0220   TRIG 78 06/14/2023 0220   HDL 51 06/14/2023 0220   CHOLHDL 3.5 06/14/2023 0220   VLDL 16 06/14/2023 0220   LDLCALC 111 (H) 06/14/2023 0220   HgbA1C  Lab Results  Component Value Date   HGBA1C 5.6 06/14/2023   Urinalysis    Component Value Date/Time   COLORURINE STRAW (A) 06/13/2023 1912   APPEARANCEUR CLEAR 06/13/2023 1912   LABSPEC 1.027 06/13/2023 1912   PHURINE 6.0 06/13/2023 1912   GLUCOSEU NEGATIVE 06/13/2023 1912   HGBUR SMALL (A) 06/13/2023 1912   BILIRUBINUR NEGATIVE 06/13/2023 1912   KETONESUR NEGATIVE  06/13/2023 1912   PROTEINUR NEGATIVE 06/13/2023 1912   NITRITE NEGATIVE 06/13/2023 1912   LEUKOCYTESUR NEGATIVE 06/13/2023 1912   Urine Drug Screen     Component Value Date/Time   LABOPIA NONE DETECTED 06/13/2023 1912   COCAINSCRNUR NONE DETECTED 06/13/2023 1912   LABBENZ NONE DETECTED 06/13/2023 1912   AMPHETMU NONE DETECTED 06/13/2023 1912   THCU NONE DETECTED 06/13/2023 1912   LABBARB NONE DETECTED 06/13/2023 1912    Alcohol Level    Component Value Date/Time   ETH <10 06/13/2023 1928     SIGNIFICANT DIAGNOSTIC STUDIES VAS [place] TRANSCRANIAL DOPPLER W BUBBLES  Result Date: 06/15/2023  Transcranial Doppler with Bubble Patient Name:  Rebekah Mendez  Date of Exam:   06/14/2023 Medical Rec #: [id number]       Accession #:    [id number] Date of Birth: 02/15/1961      Patient  Gender: F Patient Age:   62 years Exam Location:  [facility] Procedure:      VAS [place] TRANSCRANIAL DOPPLER W BUBBLES Referring Phys: Hetty Blend --------------------------------------------------------------------------------  Indications: Stroke. Limitations for diagnostic windows: Unable to insonate right transtemporal window. Unable to insonate left transtemporal window. Comparison Study: No prior study Performing Technologist: Sherren Kerns RVS  Examination Guidelines: A complete evaluation includes B-mode imaging, spectral Doppler, color Doppler, and power Doppler as needed of all accessible portions of each vessel. Bilateral testing is considered an integral part of a complete examination. Limited examinations for reoccurring indications may be performed as noted.  Summary: No HITS at rest or during Valsalva. Negative transcranial Doppler Bubble study with no evidence of right to left intracardiac communication.  *See table(s) above for TCD measurements and observations.    Preliminary    VAS [place] LOWER EXTREMITY VENOUS (DVT)  Result Date: 06/15/2023  Lower Venous DVT Study Patient Name:  Rebekah Mendez  Date of Exam:   06/14/2023 Medical Rec #: [id number]       Accession #:    [id number] Date of Birth: [birth year]      Patient Gender: F Patient Age:   62 years Exam Location:  [facility] Procedure:      VAS [place] LOWER EXTREMITY VENOUS (DVT) Referring Phys: Hetty Blend --------------------------------------------------------------------------------  Indications: Stroke.  Comparison Study: No prior study on file Performing Technologist: Sherren Kerns RVS  Examination Guidelines: A complete evaluation includes B-mode imaging, spectral Doppler, color Doppler, and power Doppler as needed of all accessible portions of each vessel. Bilateral testing is considered an integral part of a complete examination. Limited examinations for reoccurring indications may be performed as noted. The reflux portion of  the exam is performed with the patient in reverse Trendelenburg.  +---------+---------------+---------+-----------+----------+--------------+ RIGHT    CompressibilityPhasicitySpontaneityPropertiesThrombus Aging +---------+---------------+---------+-----------+----------+--------------+ CFV      Full           Yes      Yes                                 +---------+---------------+---------+-----------+----------+--------------+ SFJ      Full                                                        +---------+---------------+---------+-----------+----------+--------------+ FV Prox  Full                                                        +---------+---------------+---------+-----------+----------+--------------+  FV Mid   Full                                                        +---------+---------------+---------+-----------+----------+--------------+ FV DistalFull                                                        +---------+---------------+---------+-----------+----------+--------------+ PFV      Full                                                        +---------+---------------+---------+-----------+----------+--------------+ POP      Full           Yes      Yes                                 +---------+---------------+---------+-----------+----------+--------------+ PTV      Full                                                        +---------+---------------+---------+-----------+----------+--------------+ PERO     Full                                                        +---------+---------------+---------+-----------+----------+--------------+ Gastroc  Full                                                        +---------+---------------+---------+-----------+----------+--------------+   +---------+---------------+---------+-----------+----------+--------------+ LEFT      CompressibilityPhasicitySpontaneityPropertiesThrombus Aging +---------+---------------+---------+-----------+----------+--------------+ CFV      Full           Yes      Yes                                 +---------+---------------+---------+-----------+----------+--------------+ SFJ      Full                                                        +---------+---------------+---------+-----------+----------+--------------+ FV Prox  Full                                                        +---------+---------------+---------+-----------+----------+--------------+  FV Mid   Full                                                        +---------+---------------+---------+-----------+----------+--------------+ FV DistalFull                                                        +---------+---------------+---------+-----------+----------+--------------+ PFV      Full                                                        +---------+---------------+---------+-----------+----------+--------------+ POP      Full           Yes      Yes                                 +---------+---------------+---------+-----------+----------+--------------+ PTV      Full                                                        +---------+---------------+---------+-----------+----------+--------------+ PERO     Full                                                        +---------+---------------+---------+-----------+----------+--------------+     Summary: BILATERAL: - No evidence of deep vein thrombosis seen in the lower extremities, bilaterally. -No evidence of popliteal cyst, bilaterally.   *See table(s) above for measurements and observations. Electronically signed by Coral Else MD on 06/15/2023 at 8:16:15 AM.    Final    VAS US CAROTID  Result Date: 06/15/2023 Carotid Arterial Duplex Study Patient Name:  KATRIA BOTTS  Date of Exam:   06/14/2023 Medical Rec #:  [id number]       Accession #:    [id number] Date of Birth: 01-16-1961      Patient Gender: F Patient Age:   37 years Exam Location:  [facility] Procedure:      VAS US CAROTID Referring Phys: Hetty Blend --------------------------------------------------------------------------------  Indications:       CVA, Numbness and Weakness. Risk Factors:      Current smoker. Comparison Study:  No prior study on file Performing Technologist: Sherren Kerns RVS  Examination Guidelines: A complete evaluation includes B-mode imaging, spectral Doppler, color Doppler, and power Doppler as needed of all accessible portions of each vessel. Bilateral testing is considered an integral part of a complete examination. Limited examinations for reoccurring indications may be performed as noted.  Right Carotid Findings: +----------+--------+--------+--------+------------------+------------------+           PSV cm/sEDV cm/sStenosisPlaque DescriptionComments           +----------+--------+--------+--------+------------------+------------------+  CCA Prox  133     21                                intimal thickening +----------+--------+--------+--------+------------------+------------------+ CCA Distal100     31                                intimal thickening +----------+--------+--------+--------+------------------+------------------+ ICA Prox  87      27              homogeneous                          +----------+--------+--------+--------+------------------+------------------+ ICA Mid   112     51                                                   +----------+--------+--------+--------+------------------+------------------+ ICA Distal166     73                                                   +----------+--------+--------+--------+------------------+------------------+ ECA       115     25                                                    +----------+--------+--------+--------+------------------+------------------+ +----------+--------+-------+--------+-------------------+           PSV cm/sEDV cmsDescribeArm Pressure (mmHG) +----------+--------+-------+--------+-------------------+ [id number]                                        +----------+--------+-------+--------+-------------------+ +---------+--------+--+--------+--+ VertebralPSV cm/s91EDV cm/s32 +---------+--------+--+--------+--+ Elevated systolic and diastolic velocities are elevated with no significant plaque noted in the mid to distal ICA Left Carotid Findings: +----------+--------+--------+--------+------------------+------------------+           PSV cm/sEDV cm/sStenosisPlaque DescriptionComments           +----------+--------+--------+--------+------------------+------------------+ CCA Prox  119     28                                intimal thickening +----------+--------+--------+--------+------------------+------------------+ CCA Distal107     26                                intimal thickening +----------+--------+--------+--------+------------------+------------------+ ICA Prox  87      32                                                   +----------+--------+--------+--------+------------------+------------------+ ICA Mid   289     114                                                  +----------+--------+--------+--------+------------------+------------------+  ICA Distal214     71                                                   +----------+--------+--------+--------+------------------+------------------+ ECA       123     17                                                   +----------+--------+--------+--------+------------------+------------------+ +----------+--------+--------+--------+-------------------+           PSV cm/sEDV cm/sDescribeArm Pressure (mmHG)  +----------+--------+--------+--------+-------------------+ [id number]                                         +----------+--------+--------+--------+-------------------+ +---------+--------+--+--------+--+ VertebralPSV cm/s90EDV cm/s35 +---------+--------+--+--------+--+ Elevated systolic and diastolic velocities are elevated with no significant plaque noted in the mid to distal ICA  Summary: Right Carotid: Elevated systolic and diastolic velocities are elevated with mild                nonsignificant plaque noted in the mid to distal ICA. Left Carotid: There is no evidence of stenosis in the left ICA. Elevated               systolic and diastolic velocities are elevated with no significant               plaque noted in the mid to distal ICA. Vertebrals:  Bilateral vertebral arteries demonstrate antegrade flow. Subclavians: Normal flow hemodynamics were seen in bilateral subclavian              arteries. *See table(s) above for measurements and observations.  Electronically signed by Delia Heady MD on 06/15/2023 at 7:10:35 AM.    Final    CT HEAD WO CONTRAST ( )  Result Date: 06/14/2023 CLINICAL DATA:  Follow-up examination for stroke. EXAM: CT HEAD WITHOUT CONTRAST TECHNIQUE: Contiguous axial images were obtained from the base of the skull through the vertex without intravenous contrast. RADIATION DOSE REDUCTION: This exam was performed according to the departmental dose-optimization program which includes automated exposure control, adjustment of the mA and/or kV according to patient size and/or use of iterative reconstruction technique. COMPARISON:  Prior MRI from 06/13/2023. FINDINGS: Brain: Previously identified perforator type infarct involving the right basal ganglia is grossly stable in size and distribution from prior. No significant mass effect. No evidence for hemorrhagic transformation. No other acute intracranial hemorrhage or large vessel territory infarct. No mass lesion or  midline shift. No hydrocephalus or extra-axial fluid collection. Vascular: No abnormal hyperdense vessel. Skull: Scalp soft tissues and calvarium demonstrate no new finding. Sinuses/Orbits: Globes orbital soft tissues within normal limits. Paranasal sinuses and mastoid air cells remain clear. Other: None. IMPRESSION: 1. Continued interval evolution of previously identified perforator infarct involving the right basal ganglia, relatively stable in size. No evidence for hemorrhagic transformation or significant regional mass effect. 2. No other new acute intracranial abnormality. Electronically Signed   By: Rise Mu M.D.   On: 06/14/2023 19:56   ECHOCARDIOGRAM COMPLETE  Result Date: 06/14/2023    ECHOCARDIOGRAM REPORT   Patient Name:   KERLINE TRAHAN Date of Exam: 06/14/2023 Medical Rec #:  [id number]      Height:       64.5 in Accession #:    [id number]     Weight:       228.4 lb Date of Birth:  [birth year]     BSA:          2.081 m Patient Age:    61 years       BP:           139/76 mmHg Patient Gender: F              HR:           72 bpm. Exam Location:  Inpatient Procedure: 2D Echo, Color Doppler and Cardiac Doppler Indications:    stroke  History:        Patient has no prior history of Echocardiogram examinations.  Sonographer:    Delcie Roch RDCS Referring Phys: [phone] ERIC LINDZEN IMPRESSIONS  1. Left ventricular ejection fraction, by estimation, is 70 to 75%. The left ventricle has hyperdynamic function. The left ventricle has no regional wall motion abnormalities. Left ventricular diastolic parameters are indeterminate.  2. Right ventricular systolic function is normal. The right ventricular size is normal.  3. The mitral valve is normal in structure. No evidence of mitral valve regurgitation. No evidence of mitral stenosis.  4. The aortic valve is tricuspid. Aortic valve regurgitation is not visualized. No aortic stenosis is present.  5. The inferior vena cava is normal in size with greater  than 50% respiratory variability, suggesting right atrial pressure of 3 mmHg. Comparison(s): No prior Echocardiogram. FINDINGS  Left Ventricle: Left ventricular ejection fraction, by estimation, is 70 to 75%. The left ventricle has hyperdynamic function. The left ventricle has no regional wall motion abnormalities. The left ventricular internal cavity size was normal in size. There is no left ventricular hypertrophy. Left ventricular diastolic parameters are indeterminate. Right Ventricle: The right ventricular size is normal. Right ventricular systolic function is normal. Left Atrium: Left atrial size was normal in size. Right Atrium: Right atrial size was normal in size. Pericardium: There is no evidence of pericardial effusion. Mitral Valve: The mitral valve is normal in structure. No evidence of mitral valve regurgitation. No evidence of mitral valve stenosis. Tricuspid Valve: The tricuspid valve is normal in structure. Tricuspid valve regurgitation is trivial. No evidence of tricuspid stenosis. Aortic Valve: The aortic valve is tricuspid. Aortic valve regurgitation is not visualized. No aortic stenosis is present. Pulmonic Valve: The pulmonic valve was normal in structure. Pulmonic valve regurgitation is not visualized. No evidence of pulmonic stenosis. Aorta: The aortic root is normal in size and structure. Venous: The inferior vena cava is normal in size with greater than 50% respiratory variability, suggesting right atrial pressure of 3 mmHg. IAS/Shunts: No atrial level shunt detected by color flow Doppler.  LEFT VENTRICLE PLAX 2D LVIDd:         4.80 cm   Diastology LVIDs:         2.60 cm   LV e' medial:  9.14 cm/s LV PW:         1.10 cm   LV e' lateral: 8.05 cm/s LV IVS:        1.20 cm LVOT diam:     1.90 cm LV SV:         79 LV SV Index:   38 LVOT Area:     2.84 cm  RIGHT VENTRICLE             IVC RV  Basal diam:  2.80 cm     IVC diam: 1.60 cm RV S prime:     17.00 cm/s TAPSE (M-mode): 2.2 cm LEFT ATRIUM              Index        RIGHT ATRIUM           Index LA diam:        3.30 cm 1.59 cm/m   RA Area:     12.80 cm LA Vol (A2C):   68.0 ml 32.68 ml/m  RA Volume:   28.30 ml  13.60 ml/m LA Vol (A4C):   58.5 ml 28.11 ml/m LA Biplane Vol: 63.6 ml 30.56 ml/m  AORTIC VALVE LVOT Vmax:   140.00 cm/s LVOT Vmean:  94.100 cm/s LVOT VTI:    0.277 m  AORTA Ao Root diam: 3.20 cm Ao Asc diam:  3.40 cm  SHUNTS Systemic VTI:  0.28 m Systemic Diam: 1.90 cm Olga Millers MD Electronically signed by Olga Millers MD Signature Date/Time: 06/14/2023/4:16:38 PM    Final    MR BRAIN WO CONTRAST  Result Date: 06/13/2023 CLINICAL DATA:  Follow-up examination for stroke. EXAM: MRI HEAD WITHOUT CONTRAST TECHNIQUE: Multiplanar, multiecho pulse sequences of the brain and surrounding structures were obtained without intravenous contrast. COMPARISON:  Prior studies from earlier the same day. FINDINGS: Brain: Cerebral volume within normal limits. No significant cerebral white matter disease for age. Small remote lacunar infarct present at the right lentiform nucleus with associated chronic hemosiderin staining. 1.6 cm acute ischemic perforator type infarcts seen involving the posterior right basal ganglia (series 13, image 21). This is positioned immediately superior to the above mention chronic lacunar infarct. No other evidence for acute or subacute ischemia. Gray-white matter differentiation otherwise maintained. No other acute or chronic intracranial blood products. No mass lesion, midline shift or mass effect. No hydrocephalus or extra-axial fluid collection. Pituitary gland suprasellar region within normal limits. Vascular: Major intracranial vascular flow voids are maintained. Skull and upper cervical spine: Craniocervical junction within normal limits. Bone marrow signal intensity normal. No scalp soft tissue abnormality. Sinuses/Orbits: Globes and orbital soft tissues within normal limits. Paranasal sinuses are largely clear. No  significant mastoid effusion. Other: None. IMPRESSION: 1. 1.6 cm acute ischemic nonhemorrhagic perforator type infarct involving the posterior right basal ganglia. 2. Small remote hemorrhagic lacunar infarct at the right lentiform nucleus. 3. Otherwise normal brain MRI for age. Electronically Signed   By: Rise Mu M.D.   On: 06/13/2023 23:07   CT Angio Head Neck W WO CM  Result Date: 06/13/2023 CLINICAL DATA:  Left-side feels heavy EXAM: CT ANGIOGRAPHY HEAD AND NECK WITH AND WITHOUT CONTRAST TECHNIQUE: Multidetector CT imaging of the head and neck was performed using the standard protocol during bolus administration of intravenous contrast. Multiplanar CT image reconstructions and MIPs were obtained to evaluate the vascular anatomy. Carotid stenosis measurements (when applicable) are obtained utilizing NASCET criteria, using the distal internal carotid diameter as the denominator. RADIATION DOSE REDUCTION: This exam was performed according to the departmental dose-optimization program which includes automated exposure control, adjustment of the mA and/or kV according to patient size and/or use of iterative reconstruction technique. COMPARISON:  No prior CTA available, correlation is made with CT head 06/13/2023 FINDINGS: CT HEAD FINDINGS For noncontrast findings, please see same day CT head. CTA NECK FINDINGS Aortic arch: Standard branching. Imaged portion shows no evidence of aneurysm or dissection. No significant stenosis of the major arch vessel origins. Right carotid system: No  evidence of dissection, occlusion, or hemodynamically significant stenosis (greater than 50%). Beading of the mid right ICA (series 12, image 120 and series 11, image 144). Left carotid system: No evidence of dissection, occlusion, or hemodynamically significant stenosis (greater than 50%). Beading of the mid left ICA (series 13, image 134 and series 11, image 147). Vertebral arteries: No evidence of dissection, occlusion,  or hemodynamically significant stenosis (greater than 50%). Beading at the bilateral V3 segments (series 11, images 144-148). Skeleton: No acute osseous abnormality. Degenerative changes in the cervical spine. Other neck: 4.3 cm heterogeneously enhancing mass in the right thyroid lobe. Otherwise negative. Upper chest: No focal pulmonary opacity or pleural effusion. Review of the MIP images confirms the above findings CTA HEAD FINDINGS Anterior circulation: Both internal carotid arteries are patent to the termini, without significant stenosis. A1 segments patent. Normal anterior communicating artery. Anterior cerebral arteries are patent to their distal aspects without significant stenosis. No M1 stenosis or occlusion. MCA branches perfused to their distal aspects without significant stenosis. Posterior circulation: Vertebral arteries patent to the vertebrobasilar junction without significant stenosis. Posterior inferior cerebellar arteries patent proximally. Basilar patent to its distal aspect without significant stenosis. Superior cerebellar arteries patent proximally. Patent P1 segments. PCAs perfused to their distal aspects without significant stenosis. The left posterior communicating arteries patent. Venous sinuses: As permitted by contrast timing, patent. Anatomic variants: None significant. No evidence of aneurysm or vascular malformation. Review of the MIP images confirms the above findings IMPRESSION: 1. No intracranial large vessel occlusion or significant stenosis. 2. No hemodynamically significant stenosis in the neck. 3. Beading of the bilateral mid ICAs and bilateral V3 segments, which can be seen in the setting of fibromuscular dysplasia. 4. 4.3 cm heterogeneously enhancing mass in the right thyroid lobe. If this has not previously been evaluated, a non-emergent ultrasound of the thyroid is recommended. (Reference: J Am Coll Radiol. 2015 Feb;12(2): 143-50) Electronically Signed   By: Wiliam Ke  M.D.   On: 06/13/2023 19:59   CT HEAD CODE STROKE WO CONTRAST  Result Date: 06/13/2023 CLINICAL DATA:  Code stroke.  Left-side feeling heavy EXAM: CT HEAD WITHOUT CONTRAST TECHNIQUE: Contiguous axial images were obtained from the base of the skull through the vertex without intravenous contrast. RADIATION DOSE REDUCTION: This exam was performed according to the departmental dose-optimization program which includes automated exposure control, adjustment of the mA and/or kV according to patient size and/or use of iterative reconstruction technique. COMPARISON:  None Available. FINDINGS: Brain: No evidence of acute infarction, hemorrhage, mass, mass effect, or midline shift. No hydrocephalus or extra-axial collection. Vascular: No hyperdense vessel. Skull: Negative for fracture or focal lesion. Sinuses/Orbits: No acute finding. Other: The mastoid air cells are well aerated. ASPECTS [facility] Stroke Program Early CT Score) - Ganglionic level infarction (caudate, lentiform nuclei, internal capsule, insula, M1-M3 cortex): 7 - Supraganglionic infarction (M4-M6 cortex): 3 Total score (0-10 with 10 being normal): 10 IMPRESSION: No acute intracranial process. ASPECTS is 10. Code stroke imaging results were communicated on 06/13/2023 at 7:50 pm to provider Dr. Wilford Corner via telephone, who verbally acknowledged these results. Electronically Signed   By: Wiliam Ke M.D.   On: 06/13/2023 19:51       HISTORY OF PRESENT ILLNESS Ms. Prudence Heiny is a  62 year old woman with no significant past medical history because she has not seen a physician presented to the emergency department for evaluation of sudden onset of left-sided face arm and leg weakness along with decreased sensation on the left and some  slurred speech.   She was having dinner around 6:30 PM when she had sudden onset of left-sided numbness and weakness. CODE STROKE activated at [facility]. BP 225 systolic on arrival. Patient was reportedly also very anxious  on arrival. NIH: 4. After BP controlled, TNK was given at 2007.   MRI shows R basal ganglia infarct.    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 ASPECTS 10.    CTA head & neck  No LVO or significant stenosis Beading of bilateral mid ICAs, often seen in fibromuscular dysplasia   MRI   1.6cm acute ischemic non-hemorrhagic posterior right basal ganglia infarct Small remote hemorrhagic lacunar infarct right lentiform nucleus   2D Echo: EF 70-75%. LV hyperdynamic and no wall motion abnormalities.  Carotid US: Non significant plaque noted in mid to distal R ICA and L ICA.  No evidence of stenosis.   TCD w/Bubble Study: Negative  [place] LE DVT: No evidence of DVT  ANA labs: outptaient follow up LDL 111 HgbA1c 5.6   VTE prophylaxis - SCDs and Lovenox  No hemorrhage on CT  Continue ASA and plavix for 3 weeks, followed by aspirin monotherapy.  Therapy recommendations:  CIR, awaiting full assessment  OOB as tolerable and ambulation  Disposition:  CIR  Hypertension Home meds:  none Continue amlodipine 5mg  Hydralazine 20mg  IV Q6H PRN added Mildly hypertensive to 150s, BP at goal  Blood Pressure Goal: BP less than 180/105    Hyperlipidemia Home meds:  none LDL 111, goal < 70 Add Crestor 20mg   Continue statin at discharge   Diabetes type II , no history Home meds:  none HgbA1c 5.6, goal < 7.0 CBGs SSI Recommend close follow-up with PCP   Tobacco Abuse Current cigarette smoker       Ready to quit? Yes Nicotine replacement therapy ordered and cessation education provided   Other Stroke Risk Factors Obesity, Body mass index is 38.6 kg/m., BMI >/= 30 associated with increased stroke risk, recommend weight loss, diet and exercise as appropriate  ?Obstructive sleep apnea Patient consented to  participate in the sleep smart study and tested positive for obstructive sleep apnea. Tolerated CPAP overnight and will recommend at discharge  Other Active Problems Poor Medical Management/Does not visit doctor regularly [facility] consult placed for assistance with finding a PCP C/o Muscle Spasms and pain Extra-strength tylenol added PRN Robaxin 500 TID added PRN Constipation Senna prn ordered  Anxiety Xanax 0.25 BID added PRN  DISCHARGE EXAM Blood pressure (!) 148/77, pulse 70, temperature 98 F (36.7 C), temperature source Oral, resp. rate 17, height 5' 4.5" (1.638 m), weight 103.6 kg, SpO2 97%.  PHYSICAL EXAM General:  Alert, well-nourished, well-developed patient in no acute distress Psych:  Mood and affect appropriate for situation CV: Regular rate and rhythm on monitor Respiratory:  Regular, unlabored respirations on room air   NEURO:  Mental Status: AA&Ox3, patient is able to give clear and coherent history Speech/Language: speech is without dysarthria or aphasia.  Naming, repetition, fluency, and comprehension intact.   Cranial Nerves:  II: PERRL. Visual fields full.  III, IV, VI: EOMI. Eyelids elevate symmetrically.  V: Sensation decreased to light touch on left side  VII: Minimal left sided asymmetry  VIII: hearing intact to voice. IX, X: Palate elevates symmetrically. Phonation is normal.  UJ:[id number] shrug 5/5. XII:  tongue is midline without fasciculations. Motor: LUE and LLE 4/5 strength, better than prior  5/5 of RUE and RLE. Tone: is normal and bulk is normal Sensation- Intact Coordination: FTN Gait- deferred    Discharge Diet      Diet   Diet Heart Room service appropriate? Yes; Fluid consistency: Thin   liquids  DISCHARGE PLAN Disposition:  Transfer to [facility] for ongoing PT, OT and ST aspirin 81 mg daily and clopidogrel 75 mg daily for secondary stroke prevention for 3 weeks then aspirin alone. Recommend ongoing stroke risk factor  control by Primary Care Physician at time of discharge from inpatient rehabilitation. Follow-up PCP Elie Confer, NP in 2 weeks following discharge from rehab. Follow-up in [facility] in 8 weeks following discharge from rehab, office to schedule an appointment.   35 minutes were spent preparing discharge.  I have personally obtained history,examined this patient, reviewed notes, independently viewed imaging studies, participated in medical decision making and plan of care.ROS completed by me personally and pertinent positives fully documented  I have made any additions or clarifications directly to the above note. Agree with note above.    Delia Heady, MD Medical Director [facility] Pager: [phone] 06/17/2023 3:10 PM

## 2023-06-17 NOTE — Progress Notes (Signed)
Report given to Joni Reining, RN on [facility] assuming care of patient. All questions answered. VSS. RA. Respirations are even and unlabored. NAD. Patient denies CP or SOB. No PIV. Patient to be transported shortly.

## 2023-06-17 NOTE — Progress Notes (Signed)
Pt to be wheeled off unit by this RN via wheelchair. All belongings gathered by sister who is helping to transport belongings to [address].

## 2023-06-17 NOTE — Progress Notes (Signed)
Physical Therapy Treatment Patient Details Name: Rebekah Mendez MRN: [id number] DOB: 1961/02/09 Today's Date: 06/17/2023   History of Present Illness Patient is 62 y.o. female who presented to ED on 06/13/23 w/ sudden onset of Lt sided weakness/numbness. Code Stoke activated, TNK administered at [facility] and transferred to [facility]. 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 Small remote hemorrhagic lacunar infarct at the right lentiform nucleus. PMH significant for decreased thyroid activity.    PT Comments  Pt greeted resting in bed and agreeable to session with continued progress towards acute goals. Pt able to complete bed mobility with supervision with light cues to scoot out to EOB to place L foot on floor. Pt requiring grossly min A for transfers and gait with RW support with assist needing to maintain L hand grasp on RE, steady throughout gait as pt with some L knee buckling intermittently and boost up stand stand from low surfaces. Pt demonstrating some L inattention during session with head turn biased to R side. Pt able to complete x10 serial sit<>stands with R and L foot elevated on tri fold fall mat. Current plan remains appropriate to address deficits and maximize functional independence and decrease caregiver burden. Pt continues to benefit from skilled PT services to progress toward functional mobility goals.     If plan is discharge home, recommend the following: A little help with bathing/dressing/bathroom;A lot of help with walking and/or transfers   Can travel by private vehicle        Equipment Recommendations  Other (comment) (defer)    Recommendations for Other Services       Precautions / Restrictions Precautions Precautions: Fall Precaution Comments: BP <180/105 Restrictions Weight Bearing Restrictions: No     Mobility  Bed Mobility Overal bed mobility: Needs Assistance Bed Mobility: Supine to  Sit     Supine to sit: Supervision     General bed mobility comments: pt using RUE to move LUE to EOB. Increased time and effort but no physical assist needed, cues to scoot out to EOB to place L foot on floor    Transfers Overall transfer level: Needs assistance Equipment used: Rolling walker (2 wheels) Transfers: Sit to/from Stand, Bed to chair/wheelchair/BSC Sit to Stand: Min assist, Mod assist           General transfer comment: min A to rise from low EOB with cues for hand placement, mod A to rise from standard chair without arm rests    Ambulation/Gait Ambulation/Gait assistance: Min assist, +2 safety/equipment Gait Distance (Feet): 5 Feet (+ 15') Assistive device: Rolling walker (2 wheels) Gait Pattern/deviations: Step-through pattern, Decreased step length - left, Decreased stance time - left, Trunk flexed, Narrow base of support Gait velocity: decr     General Gait Details: assist to steady and maintain L hand grasp on RW, cues for increased L clearance and wider BOS   Stairs             Wheelchair Mobility     Tilt Bed    Modified Rankin (Stroke Patients Only)       Balance Overall balance assessment: Needs assistance Sitting-balance support: Feet supported, Bilateral upper extremity supported Sitting balance-Leahy Scale: Fair     Standing balance support: Bilateral upper extremity supported, During functional activity, Reliant on assistive device for balance Standing balance-Leahy Scale: Poor Standing balance comment: able to maintain static stance without UE support though fatigues quickly  Cognition Arousal: Alert Behavior During Therapy: WFL for tasks assessed/performed, Anxious Overall Cognitive Status: Impaired/Different from baseline Area of Impairment: Safety/judgement, Following commands                       Following Commands: Follows one step commands consistently, Follows one step  commands with increased time Safety/Judgement: Decreased awareness of safety Awareness: Emergent Problem Solving: Slow processing, Decreased initiation, Difficulty sequencing, Requires verbal cues, Requires tactile cues General Comments: periods of eye closing with mobility requiring cues for opening eyes, some noted L neglect needing cues to attend to L environment        Exercises Other Exercises Other Exercises: sit<>stand x5 with L foot elevated on tri-folded fall mat to increase LLE strength, x5 with R foot on fall mat to promote midline stance on rise    General Comments General comments (skin integrity, edema, etc.): VSS on RA, sister present and supportive      Pertinent Vitals/Pain Pain Assessment Pain Assessment: No/denies pain Pain Intervention(s): Monitored during session    Home Living                          Prior Function            PT Goals (current goals can now be found in the care plan section) Acute Rehab PT Goals Patient Stated Goal: regain independence PT Goal Formulation: With patient Time For Goal Achievement: 06/28/23 Progress towards PT goals: Progressing toward goals    Frequency    Min 1X/week      PT Plan      Co-evaluation              AM-PAC PT "6 Clicks" Mobility   Outcome Measure  Help needed turning from your back to your side while in a flat bed without using bedrails?: A Little Help needed moving from lying on your back to sitting on the side of a flat bed without using bedrails?: A Little Help needed moving to and from a bed to a chair (including a wheelchair)?: A Lot Help needed standing up from a chair using your arms (e.g., wheelchair or bedside chair)?: A Lot Help needed to walk in hospital room?: A Lot Help needed climbing 3-5 steps with a railing? : Total 6 Click Score: 13    End of Session Equipment Utilized During Treatment: Gait belt Activity Tolerance: Patient tolerated treatment well Patient  left: in chair;with call bell/phone within reach;with nursing/sitter in room;with family/visitor present Nurse Communication: Mobility status PT Visit Diagnosis: Muscle weakness (generalized) (M62.81);Difficulty in walking, not elsewhere classified (R26.2);Other abnormalities of gait and mobility (R26.89);Other symptoms and signs involving the nervous system (R29.898)     Time: 1000-1030 PT Time Calculation (min) (ACUTE ONLY): 30 min  Charges:    $Gait Training: 8-22 mins $Therapeutic Activity: 8-22 mins PT General Charges $$ ACUTE PT VISIT: 1 Visit                     Zared Knoth R. PTA Acute Rehabilitation Services Office: [phone]   Catalina Antigua 06/17/2023, 10:45 AM

## 2023-06-17 NOTE — TOC Transition Note (Signed)
Transition of Care [facility]) - CM/SW Discharge Note   Patient Details  Name: Rebekah Mendez MRN: [id number] Date of Birth: [birth year]  Transition of Care [facility]) CM/SW Contact:  Kermit Balo, RN Phone Number: 06/17/2023, 10:53 AM   Clinical Narrative:     Pt is discharging to CIR today. CM signing off.   Final next level of care: IP Rehab Facility Barriers to Discharge: Inadequate or no insurance, Barriers Unresolved (comment)   Patient Goals and CMS Choice CMS Medicare.gov Compare Post Acute Care list provided to:: Patient Choice offered to / list presented to : Patient  Discharge Placement                         Discharge Plan and Services Additional resources added to the After Visit Summary for                                       Social Determinants of Health (SDOH) Interventions SDOH Screenings   Food Insecurity: No Food Insecurity (06/15/2023)  Housing: Patient Declined (06/15/2023)  Transportation Needs: No Transportation Needs (06/15/2023)  Utilities: Not At Risk (06/15/2023)  Tobacco Use: High Risk (06/13/2023)     Readmission Risk Interventions     No data to display

## 2023-06-18 DIAGNOSIS — I10 Essential (primary) hypertension: Secondary | ICD-10-CM

## 2023-06-18 DIAGNOSIS — K5900 Constipation, unspecified: Secondary | ICD-10-CM

## 2023-06-18 DIAGNOSIS — R7303 Prediabetes: Secondary | ICD-10-CM

## 2023-06-18 LAB — COMPREHENSIVE METABOLIC PANEL
ALT: 26 U/L (ref 0–44)
AST: 30 U/L (ref 15–41)
Albumin: 3.1 g/dL — ABNORMAL LOW (ref 3.5–5.0)
Alkaline Phosphatase: 61 U/L (ref 38–126)
Anion gap: 10 (ref 5–15)
BUN: 14 mg/dL (ref 6–20)
CO2: 24 mmol/L (ref 22–32)
Calcium: 8.7 mg/dL — ABNORMAL LOW (ref 8.9–10.3)
Chloride: 105 mmol/L (ref 98–111)
Creatinine, Ser: 0.63 mg/dL (ref 0.44–1.00)
GFR, Estimated: >60 mL/min (ref >60)
Glucose, Bld: 104 mg/dL — ABNORMAL HIGH (ref 70–99)
Potassium: 3.7 mmol/L (ref 3.5–5.1)
Sodium: 139 mmol/L (ref 135–145)
Total Bilirubin: 0.5 mg/dL (ref <1.2)
Total Protein: 6.2 g/dL — ABNORMAL LOW (ref 6.5–8.1)

## 2023-06-18 LAB — CBC WITH DIFFERENTIAL/PLATELET
Abs Immature Granulocytes: 0.02 10*3/uL (ref 0.00–0.07)
Basophils Absolute: 0.1 10*3/uL (ref 0.0–0.1)
Basophils Relative: 1 %
Eosinophils Absolute: 0.2 10*3/uL (ref 0.0–0.5)
Eosinophils Relative: 3 %
HCT: 38 % (ref 36.0–46.0)
Hemoglobin: 12.2 g/dL (ref 12.0–15.0)
Immature Granulocytes: 0 %
Lymphocytes Relative: 35 %
Lymphs Abs: 2.2 10*3/uL (ref 0.7–4.0)
MCH: 27.4 pg (ref 26.0–34.0)
MCHC: 32.1 g/dL (ref 30.0–36.0)
MCV: 85.2 fL (ref 80.0–100.0)
Monocytes Absolute: 0.5 10*3/uL (ref 0.1–1.0)
Monocytes Relative: 7 %
Neutro Abs: 3.4 10*3/uL (ref 1.7–7.7)
Neutrophils Relative %: 54 %
Platelets: 256 10*3/uL (ref 150–400)
RBC: 4.46 MIL/uL (ref 3.87–5.11)
RDW: 12.9 % (ref 11.5–15.5)
WBC: 6.3 10*3/uL (ref 4.0–10.5)
nRBC: 0 % (ref 0.0–0.2)

## 2023-06-18 MED ORDER — POLYETHYLENE GLYCOL 3350 17 G PO PACK: 17.0000 g | PACK | Freq: Every day | ORAL | Status: DC | PRN

## 2023-06-18 MED ORDER — BLOOD PRESSURE CONTROL BOOK
Freq: Once | Status: AC
  Filled 2023-06-18: qty 1

## 2023-06-18 NOTE — Progress Notes (Signed)
Inpatient Rehabilitation Care Coordinator Assessment and Plan Patient Details  Name: Rebekah Mendez MRN: [id number] Date of Birth: 03/25/1961  Today's Date: 06/18/2023  Hospital Problems: Principal Problem:   Infarction of right basal ganglia [facility])  Past Medical History:  Past Medical History:  Diagnosis Date   OSA (obstructive sleep apnea)    Thyroid activity decreased    late 20's   Past Surgical History:  Past Surgical History:  Procedure Laterality Date   CESAREAN SECTION     Feb '89   EXCISIONAL HEMORRHOIDECTOMY     Social History:  reports that she has been smoking. She has never used smokeless tobacco. She reports current alcohol use. She reports that she does not use drugs.  Family / Support Systems Marital Status: Married Patient Roles: Spouse, Parent, Other (Comment) (Self employed) Spouse/Significant Other: Dorene Sorrow [phone] Children: [place]-daughter Both have two daughter;s and together have one son. All are within driving distance to them farthest is 2 hours Other Supports: Friends and extended family Anticipated Caregiver: Dorene Sorrow and children Ability/Limitations of Caregiver: All work but if needed can work on a care plan for [employer] for the short term Caregiver Availability: 24/7 Family Dynamics: Close with all five children and extended family and friends, pt feels she has good social supports.  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 Daughter reports she will have a family member here daily   Abuse/Neglect Abuse/Neglect Assessment Can Be Completed: Yes Physical Abuse: Denies Verbal Abuse: Denies Sexual Abuse: Denies Exploitation of patient/patient's resources: Denies Self-Neglect: Denies  Patient response to: Social Isolation - How often do you feel lonely or isolated from those around you?: Never  Emotional Status Pt's affect, behavior and adjustment status: Pt is motivated to recover she has always been independent and taken care of herself and others. She has neglected her own health and realizes she needs to take care of her own health issues and go to a MD Recent Psychosocial Issues: job stress, financial and uninsured Psychiatric History: History of anxiety at times takes medications or alternative. Have placed on neuro-psych list to be seen while here Substance Abuse History: Smokes occassionally and drinks some but does not feel it is an issue  Patient / Family Perceptions, Expectations & Goals Pt/Family understanding of illness & functional limitations: Pt and daughter can explain her stroke and the deficits she has. She has made some improvements with her leg and arm. Both do talk with the MD and feel have a good udnerstanding of her treatment plan moving forward. Premorbid pt/family roles/activities: wife, mom, grandmother, owner, home owner, friend, etc Anticipated changes in roles/activities/participation: resume Pt/family expectations/goals: Pt states: " I hope to be able to take care of myself when I leave here."  Daughter states: " She will do her best she is no one to rely upon others."  [employer]: None Premorbid Home Care/DME Agencies: None Transportation available at discharge: self and family Is the patient able to respond to transportation needs?: Yes In the past 12 months, has lack  of transportation kept you from  medical appointments or from getting medications?: No In the past 12 months, has lack of transportation kept you from meetings, work, or from getting things needed for daily living?: No Resource referrals recommended: Neuropsychology  Discharge Planning Living Arrangements: Spouse/significant other Support Systems: Spouse/significant other, Children, Other relatives, Friends/neighbors Type of Residence: Private residence Insurance Resources: Customer service manager Resources: Employment, Family Support Financial Screen Referred: Yes Living Expenses: Own Money Management: Patient, Spouse Does the patient have any problems obtaining your medications?: No (Was not going to a MD prior to admission) Home Management: Pt and husband Patient/Family Preliminary Plans: Return home with husband who is also self employed and can flex his schedule. They have grown children who will try to assist also but have families and jobs. Aware being evaluated today and goals being set for stay here. Care Coordinator Barriers to Discharge: Insurance for SNF coverage Care Coordinator Anticipated Follow Up Needs: HH/OP  Clinical Impression Pleasant female who is motivated to do well and recover from this stroke. She has gotten some movement back which is encouraging and has not had therapies yet. Her husband and children are involved and will be assisting. PCP set up via [facility]. Have placed on neuro-psych list to be seen while here and will look into financial resources due to self pay.  Lucy Chris 06/18/2023, 10:10 AM

## 2023-06-18 NOTE — Evaluation (Signed)
Occupational Therapy Assessment and Plan  Patient Details  Name: Rebekah Mendez MRN: [id number] Date of Birth: Jul 13, 1961  OT Diagnosis: flaccid hemiplegia and hemiparesis, muscle weakness (generalized), and decreased activity tolerance Rehab Potential: Rehab Potential (ACUTE ONLY): Good ELOS: 7-10 days   Today's Date: 06/18/2023 OT Individual Time: 5621-3086 OT Individual Time Calculation (min): 69 min     Hospital Problem: Principal Problem:   Infarction of right basal ganglia (HCC)   Past Medical History:  Past Medical History:  Diagnosis Date   OSA (obstructive sleep apnea)    Thyroid activity decreased    late 20's   Past Surgical History:  Past Surgical History:  Procedure Laterality Date   CESAREAN SECTION     Feb '89   EXCISIONAL HEMORRHOIDECTOMY      Assessment & Plan Clinical Impression: Patient is a 62 year old right-handed female with unremarkable past medical history except tobacco use. On no prescription medications and has not seen a physician in quite some time. Per chart review patient lives with spouse. 1 level home one-step to entry. Independent prior to admission working full-time. Presented 06/14/2023 with acute onset of left-sided weakness along with slurred speech. Noted blood pressure elevated systolic 225 on arrival. Cranial CT scan negative. Patient did receive TNK. CT head and neck no intracranial large vessel occlusion or significant stenosis. 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 Small remote hemorrhagic lacunar infarct at the right lentiform nucleus. Admission chemistries unremarkable except potassium 3.1, glucose 124, urine drug screen negative. Echocardiogram with ejection fraction of 70 to 75% no wall motion abnormalities. Neurology follow-up placed on low-dose aspirin and Plavix 75 mg daily x 3 weeks then aspirin alone. Lovenox added for DVT prophylaxis and lower  extremity Dopplers negative for DVT. She is tolerating a regular consistency diet. Therapy evaluations completed due to patient decreased functional mobility left-sided weakness was admitted for a comprehensive rehab program.  Patient transferred to CIR on 06/17/2023 .    Patient currently requires min with basic self-care skills secondary to muscle weakness, decreased cardiorespiratoy endurance, impaired timing and sequencing, unbalanced muscle activation, and decreased coordination, hemiparesis, and decreased standing balance and decreased balance strategies.  Prior to hospitalization, patient could complete BADLs/IADLs independently.   Patient will benefit from skilled intervention to increase independence with basic self-care skills prior to discharge home with care partner.  Anticipate patient will require intermittent supervision and follow up outpatient.  OT - End of Session Activity Tolerance: Tolerates 10 - 20 min activity with multiple rests Endurance Deficit: Yes OT Assessment Rehab Potential (ACUTE ONLY): Good OT Barriers to Discharge: Home environment access/layout;Weight OT Patient demonstrates impairments in the following area(s): Balance;Endurance;Motor;Pain;Perception;Safety;Sensory OT Basic ADL's Functional Problem(s): Eating;Grooming;Bathing;Dressing;Toileting OT Transfers Functional Problem(s): Toilet;Tub/Shower OT Additional Impairment(s): Fuctional Use of Upper Extremity OT Plan OT Intensity: Minimum of 1-2 x/day, 45 to 90 minutes OT Frequency: 5 out of 7 days OT Duration/Estimated Length of Stay: 7-10 days OT Treatment/Interventions: Balance/vestibular training;Cognitive remediation/compensation;Community reintegration;Discharge planning;Disease mangement/prevention;DME/adaptive equipment instruction;Functional electrical stimulation;Functional mobility training;Neuromuscular re-education;Pain management;Patient/family education;Psychosocial support;Self Care/advanced ADL  retraining;Skin care/wound managment;Splinting/orthotics;Therapeutic Activities;Therapeutic Exercise;UE/LE Strength taining/ROM;UE/LE Coordination activities;Visual/perceptual remediation/compensation;Wheelchair propulsion/positioning OT Self Feeding Anticipated Outcome(s): Mod I OT Basic Self-Care Anticipated Outcome(s): Mod I OT Toileting Anticipated Outcome(s): Mod I OT Bathroom Transfers Anticipated Outcome(s): Mod I OT Recommendation Patient destination: Home Follow Up Recommendations: OPOT Equipment Recommended: To be determined  OT Evaluation Precautions/Restrictions  Precautions Precautions: Fall Precaution Comments: BP <180/105, L-hemi Restrictions Weight Bearing Restrictions: No General  Chart Reviewed: Yes Family/Caregiver Present: Yes (Daughter, [place]) Vital Signs Therapy Vitals Pulse Rate: 78 BP: (!) 148/80 Patient Position (if appropriate): Sitting Oxygen Therapy SpO2: 100 % O2 Device: Room Air Pain Pain Assessment Pain Scale: 0-10 Pain Score: 0-No pain Home Living/Prior Functioning Home Living Family/patient expects to be discharged to:: Private residence Living Arrangements: Spouse/significant other Available Help at Discharge: Family, Available 24 hours/day Type of Home: [employer] of Steps: Threshold to enter home; 2 steps to access house from garage. 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 Prior  Function Level of Independence: Independent with basic ADLs, Independent with gait, Independent with transfers  Able to Take Stairs?: Yes Driving: Yes Vocation: Self employed Vision Baseline Vision/History: 1 Wears glasses Ability to See in Adequate Light: 0 Adequate Patient Visual Report: No change from baseline Vision Assessment?: No apparent visual deficits Perception  Perception: WFL Praxis Praxis: WFL Cognition Cognition Overall Cognitive Status: Within Functional Limits for tasks assessed Arousal/Alertness: Awake/alert Orientation Level: Person;Place;Situation Memory: Appears intact Awareness: Appears intact Problem Solving: Appears intact Safety/Judgment: Appears intact Brief Interview for Mental Status (BIMS) Repetition of Three Words (First Attempt): 3 Temporal Orientation: Year: Correct Temporal Orientation: Month: Accurate within 5 days Temporal Orientation: Day: Correct Recall: "Sock": Yes, no cue required Recall: "Blue": Yes, no cue required Recall: "Bed": Yes, no cue required BIMS Summary Score: 15 Sensation Sensation Light Touch: Impaired Detail Peripheral sensation comments: LUE numbness/stiffness; LLE improving numbness/tingling Light Touch Impaired Details: Impaired LUE;Impaired LLE Hot/Cold: Not tested Proprioception: Impaired by gross assessment Stereognosis: Not tested Additional Comments: pt able to detect light touch in all deramtomes B, but reports slightly diminished sensation in L LE comapared to R LE, pt reports tingling in L LE with weight bearing. Coordination Gross Motor Movements are Fluid and Coordinated: No Fine Motor Movements are Fluid and Coordinated: No Coordination and Movement Description: Deficits due to L-side hemiparesis Finger Nose Finger Test: Dysmetria L UE Heel Shin Test: L LE slower Motor  Motor Motor: Hemiplegia Motor - Skilled Clinical Observations: L Hemi  Trunk/Postural Assessment  Cervical Assessment Cervical  Assessment: Within Functional Limits Thoracic Assessment Thoracic Assessment: Within Functional Limits Lumbar Assessment Lumbar Assessment: Within Functional Limits Postural Control Postural Control: Deficits on evaluation Righting Reactions: delayed  Balance Balance Balance Assessed: Yes Static Sitting Balance Static Sitting - Balance Support: Right upper extremity supported Static Sitting - Level of Assistance: 5: Stand by assistance Dynamic Sitting Balance Dynamic Sitting - Balance Support: During functional activity Dynamic Sitting - Level of Assistance: 5: Stand by assistance (CGA-SUP) Dynamic Sitting - Balance Activities: Lateral lean/weight shifting;Forward lean/weight shifting;Reaching for objects;Reaching across midline Static Standing Balance Static Standing - Balance Support: Left upper extremity supported Static Standing - Level of Assistance: 4: Min assist;5: Stand by assistance Dynamic Standing Balance Dynamic Standing - Balance Support: During functional activity Dynamic Standing - Level of Assistance: 4: Min assist Dynamic Standing - Balance Activities: Lateral lean/weight shifting;Forward lean/weight shifting;Reaching for objects Extremity/Trunk Assessment RUE Assessment RUE Assessment: Within Functional Limits LUE Assessment LUE Assessment: Exceptions to  WFL Active Range of Motion (AROM) Comments: Shoulder flexion/abduction up to ~90 degrees LUE Body System: Neuro Brunstrum levels for arm and hand: Arm;Hand Brunstrum level for arm: Stage V Relative Independence from Synergy Brunstrum level for hand: Stage VI Isolated joint movements  Care Tool Care Tool Self Care Eating   Eating Assist Level: Independent    Oral Care    Oral Care Assist Level: Independent    Bathing   Body parts bathed by patient: Left arm;Chest;Abdomen;Front perineal area;Buttocks;Right upper leg;Left upper leg;Left lower leg;Face;Right lower leg Body parts bathed by helper: Right  arm   Assist Level: Minimal Assistance - Patient > 75%    Upper Body Dressing(including orthotics)   What is the patient wearing?: Pull over shirt   Assist Level: Set up assist    Lower Body Dressing (excluding footwear)   What is the patient wearing?: Underwear/pull up;Pants Assist for lower body dressing: Minimal Assistance - Patient > 75%    Putting on/Taking off footwear   What is the patient wearing?: Non-skid slipper socks Assist for footwear: Maximal Assistance - Patient 25 - 49%       Care Tool Toileting Toileting activity Toileting Activity did not occur (Clothing management and hygiene only): N/A (no void or bm)       Care Tool Bed Mobility Roll left and right activity   Roll left and right assist level: Supervision/Verbal cueing    Sit to lying activity   Sit to lying assist level: Supervision/Verbal cueing    Lying to sitting on side of bed activity   Lying to sitting on side of bed assist level: the ability to move from lying on the back to sitting on the side of the bed with no back support.: Supervision/Verbal cueing     Care Tool Transfers Sit to stand transfer   Sit to stand assist level: Contact Guard/Touching assist    Chair/bed transfer   Chair/bed transfer assist level: Minimal Assistance - Patient > 75%     Toilet transfer   Assist Level: Minimal Assistance - Patient > 75%     Care Tool Cognition  Expression of Ideas and Wants Expression of Ideas and Wants: 4. Without difficulty (complex and basic) - expresses complex messages without difficulty and with speech that is clear and easy to understand  Understanding Verbal and Non-Verbal Content Understanding Verbal and Non-Verbal Content: 4. Understands (complex and basic) - clear comprehension without cues or repetitions   Memory/Recall Ability Memory/Recall Ability : Current season;Location of own room;Staff names and faces;That he or she is in a hospital/hospital unit   Refer to Care Plan for  Long Term Goals  SHORT TERM GOAL WEEK 1 OT Short Term Goal 1 (Week 1): STGs=LTGs due to patient's estimated length of stay.  Recommendations for other services: None    Skilled Therapeutic Intervention Pt greeted sitting in [facility] for skilled OT session with focus on comprehensive OT evaluation.   Pain: Pt with no reports of pain. OT offering intermediate rest breaks and positioning suggestions throughout session to address potential pain/fatigue and maximize participation/safety in session.   Functional Transfers: See details below. Saddle splint attached to RW for compensation of decreased L-hand grip strength.   Self Care Tasks: See details below.   Pt remained resting in bed with 4Ps assessed and immediate needs met. Pt continues to be appropriate for skilled OT intervention to promote further functional independence in ADLs/IADLs.   ADL ADL Eating: Set up Where Assessed-Eating: Wheelchair Grooming: Setup Where Assessed-Grooming:  Sitting at sink Upper Body Bathing: Minimal assistance Where Assessed-Upper Body Bathing: Shower Lower Body Bathing: Minimal assistance Where Assessed-Lower Body Bathing: Shower Upper Body Dressing: Setup Where Assessed-Upper Body Dressing: Edge of bed Lower Body Dressing: Minimal assistance Where Assessed-Lower Body Dressing: Edge of bed Toileting: Minimal assistance Where Assessed-Toileting: Toilet;Bedside Commode Toilet Transfer: Minimal assistance Toilet Transfer Method: Proofreader: Grab bars Tub/Shower Transfer: Unable to assess Film/video editor: Insurance underwriter Method: Designer, industrial/product: Shower seat with back Mobility  Bed Mobility Bed Mobility: Rolling Right;Rolling Left;Supine to [employer] Right: Supervision/verbal cueing Rolling Left: Supervision/Verbal cueing Supine to Sit: Supervision/Verbal cueing Transfers Sit to Stand: Contact Guard/Touching  assist Stand to Sit: Contact Guard/Touching assist  Discharge Criteria: Patient will be discharged from OT if patient refuses treatment 3 consecutive times without medical reason, if treatment goals not met, if there is a change in medical status, if patient makes no progress towards goals or if patient is discharged from hospital.  The above assessment, treatment plan, treatment alternatives and goals were discussed and mutually agreed upon: by patient  Isabella Stalling 06/18/2023, 12:05 PM

## 2023-06-18 NOTE — Plan of Care (Signed)
  Problem: RH Balance Goal: LTG Patient will maintain dynamic standing with ADLs (OT) Description: LTG:  Patient will maintain dynamic standing balance with assist during activities of daily living (OT)  Flowsheets (Taken 06/18/2023 1608) LTG: Pt will maintain dynamic standing balance during ADLs with: Independent with assistive device   Problem: RH Eating Goal: LTG Patient will perform eating w/assist, cues/equip (OT) Description: LTG: Patient will perform eating with assist, with/without cues using equipment (OT) Flowsheets (Taken 06/18/2023 1608) LTG: Pt will perform eating with assistance level of: Independent with assistive device    Problem: RH Grooming Goal: LTG Patient will perform grooming w/assist,cues/equip (OT) Description: LTG: Patient will perform grooming with assist, with/without cues using equipment (OT) Flowsheets (Taken 06/18/2023 1608) LTG: Pt will perform grooming with assistance level of: Independent with assistive device    Problem: RH Bathing Goal: LTG Patient will bathe all body parts with assist levels (OT) Description: LTG: Patient will bathe all body parts with assist levels (OT) Flowsheets (Taken 06/18/2023 1608) LTG: Pt will perform bathing with assistance level/cueing: Independent with assistive device    Problem: RH Dressing Goal: LTG Patient will perform upper body dressing (OT) Description: LTG Patient will perform upper body dressing with assist, with/without cues (OT). Flowsheets (Taken 06/18/2023 1608) LTG: Pt will perform upper body dressing with assistance level of: Independent with assistive device Goal: LTG Patient will perform lower body dressing w/assist (OT) Description: LTG: Patient will perform lower body dressing with assist, with/without cues in positioning using equipment (OT) Flowsheets (Taken 06/18/2023 1608) LTG: Pt will perform lower body dressing with assistance level of: Independent with assistive device   Problem: RH Toileting Goal:  LTG Patient will perform toileting task (3/3 steps) with assistance level (OT) Description: LTG: Patient will perform toileting task (3/3 steps) with assistance level (OT)  Flowsheets (Taken 06/18/2023 1608) LTG: Pt will perform toileting task (3/3 steps) with assistance level: Independent with assistive device   Problem: RH Functional Use of Upper Extremity Goal: LTG Patient will use RT/LT upper extremity as a (OT) Description: LTG: Patient will use right/left upper extremity as a stabilizer/gross assist/diminished/nondominant/dominant level with assist, with/without cues during functional activity (OT) Flowsheets (Taken 06/18/2023 1608) LTG: Use of upper extremity in functional activities: LUE as diminished level LTG: Pt will use upper extremity in functional activity with assistance level of: Independent with assistive device   Problem: RH Toilet Transfers Goal: LTG Patient will perform toilet transfers w/assist (OT) Description: LTG: Patient will perform toilet transfers with assist, with/without cues using equipment (OT) Flowsheets (Taken 06/18/2023 1608) LTG: Pt will perform toilet transfers with assistance level of: Independent with assistive device   Problem: RH Tub/Shower Transfers Goal: LTG Patient will perform tub/shower transfers w/assist (OT) Description: LTG: Patient will perform tub/shower transfers with assist, with/without cues using equipment (OT) Flowsheets (Taken 06/18/2023 1608) LTG: Pt will perform tub/shower stall transfers with assistance level of: Independent with assistive device

## 2023-06-18 NOTE — Patient Care Conference (Signed)
Inpatient RehabilitationTeam Conference and Plan of Care Update Date: 06/18/2023   Time: 12:24 PM    Patient Name: Rebekah Mendez      Medical Record Number: [id number]  Date of Birth: 1960/12/22 Sex: Female         Room/Bed: 4W02C/4W02C-01 Payor Info: Payor: /    Admit Date/Time:  06/17/2023 12:43 PM  Primary Diagnosis:  Infarction of right basal ganglia [facility])  Hospital Problems: Principal Problem:   Infarction of right basal ganglia [facility])    Expected Discharge Date: Expected Discharge Date: 06/27/23  Team Members Present: Physician leading conference: Dr. Fanny Dance Social Worker Present: Dossie Der, LCSW Nurse Present: Chana Bode, RN PT Present: Ambrose Finland, PT OT Present: Lou Cal, OT PPS Coordinator present : Fae Pippin, SLP     Current Status/Progress Goal Weekly Team Focus  Bowel/Bladder   Pt currently continent B/B  LBM 06/16/23   Pt will maintain cotinence with normal B/B pattern   Assist pt wih toileting needs qshift/prn    Swallow/Nutrition/ Hydration               ADL's   Min A overall   Mod I   LUE/LLE NMR & compensatory techniques as needed.    Mobility   bed mobility supervision, sit to stand and stand pivot transfer with no AD and L HHA/min A, gait x60 feet with L HHA, 4 6inch steps with B HR and CGA, car transfer with min A with no AD.   mod I/supervision       Communication                Safety/Cognition/ Behavioral Observations               Pain   Denies pain at this time   Pt will be free from pain   Assess pt for pain qshift/prn and provide education on pain medication    Skin   Pt skin is currently intactPt will maintain skun intergrity with no breakdown   Pt will maintan skin intergrity with no breakdown  Assess skin qshift/prn for breakdown and provide education to prevent skin breakdown      Discharge Planning:  New evaluation today home with husband who is self employed and children  may be able to assist some. Pt hopeful to be mod/i. Placed on neuro-psych for her anxiety   Team Discussion: Patient post right basal ganglia infarct on the Sleep Smart Study/CPAP in room.    Patient on target to meet rehab goals: yes, currently needs min assist for ADLs.  Completes sit - stand and stand pivot transfers without a assistive device with min assist. Able to ambulate up to 24' with HHA and manage 4 steps with CGA and car transfers with min assist.  Goals for discharge set for mod I overall.  *See Care Plan and progress notes for long and short-term goals.   Revisions to Treatment Plan:  Saddle-splint for walker Resting hand splint   Teaching Needs: Safety, medications, dietary modification, transfers, toileting,etc.   Current Barriers to Discharge: Decreased caregiver support and Home enviroment access/layout  Possible Resolutions to Barriers: Family education     Medical Summary Current Status: right basal ganglia infarction with L hemiparesis, HTN, constipation, tobacco use, Prediabetes, Morbid obesity, HLD  Barriers to Discharge: Medical stability;Morbid Obesity  Barriers to Discharge Comments: right basal ganglia infarction with L hemiparesis, HTN, constipation, tobacco use, Prediabetes, Morbid obesity, Possible Resolutions to [employer] Focus: stop nicotine patch, monitor bowel function, monitor  BP   Continued Need for Acute Rehabilitation Level of Care: The patient requires daily medical management by a physician with specialized training in physical medicine and rehabilitation for the following reasons: Direction of a multidisciplinary physical rehabilitation program to maximize functional independence : Yes Medical management of patient stability for increased activity during participation in an intensive rehabilitation regime.: Yes Analysis of laboratory values and/or radiology reports with any subsequent need for medication adjustment and/or medical  intervention. : Yes   I attest that I was present, lead the team conference, and concur with the assessment and plan of the team.   Chana Bode B 06/18/2023, 1:12 PM

## 2023-06-18 NOTE — Progress Notes (Addendum)
PROGRESS NOTE   Subjective/Complaints: No acute events overnight.  Patient asked about blood pressure and lab results, reviewed with patient.  No additional concerns this morning.  ROS: Patient denies fever, rash, sore throat, blurred vision, dizziness, nausea, vomiting, diarrhea, cough, shortness of breath or chest pain, joint or back/neck pain, headache, or mood change.    Objective:   No results found. Recent Labs    06/17/23 1330 06/18/23 0518  WBC 6.9 6.3  HGB 12.0 12.2  HCT 37.5 38.0  PLT 256 256   Recent Labs    06/17/23 0452 06/18/23 0518  NA 138 139  K 3.5 3.7  CL 106 105  CO2 23 24  GLUCOSE 101* 104*  BUN 14 14  CREATININE 0.64 0.63  CALCIUM 8.5* 8.7*    Intake/Output Summary (Last 24 hours) at 06/18/2023 1212 Last data filed at 06/18/2023 0700 Gross per 24 hour  Intake 59 ml  Output --  Net 59 ml        Physical Exam: Vital Signs Blood pressure (!) 148/80, pulse 78, temperature 98.4 F (36.9 C), temperature source Oral, resp. rate 18, height 5' 4.5" (1.638 m), weight 101.1 kg, SpO2 100%.  Physical Exam Vitals and nursing note reviewed. Exam conducted with a chaperone present.    General:  No apparent distress HEENT: Head is normocephalic, atraumatic, mucous membranes dry  Heart: Reg rate and rhythm. No murmurs rubs or gallops Chest: CTA bilaterally without wheezes, rales, or rhonchi; no distress Abdomen: Soft, non-tender, non-distended, bowel sounds positive. Extremities: No clubbing, cyanosis, or edema. Pulses are 2+ Psych: Pt's affect is appropriate. Pt is cooperative Skin:  Knot in L AC fossa size of medium strawberry from infiltration of IV- slightly warm, but using hot packs Otherwise, no skin issues seen  Neuro: Alert and awake,  follows simple commands,  Speech is a bit dysarthric but intelligible. L Facial weakness when smiling   Fair insight and awareness. Dysarthria  appears to be from sensory deficit.  L side has decreased sensation to light touch throughout   Musculoskeletal:     Cervical back: Neck supple. No tenderness.     Comments: RUE 5/5 LUE biceps 4/5; Triceps 4/5; WE 4-/5; grip 4-/5 and FA 2-/5 RLE- 5/5 LLE- proximally 4/5 and distally 4+/5     Assessment/Plan: 1. Functional deficits which require 3+ hours per day of interdisciplinary therapy in a comprehensive inpatient rehab setting. Physiatrist is providing close team supervision and 24 hour management of active medical problems listed below. Physiatrist and rehab team continue to assess barriers to discharge/monitor patient progress toward functional and medical goals  Care Tool:  Bathing    Body parts bathed by patient: Right arm, Left arm, Chest, Abdomen, Front perineal area, Buttocks, Right upper leg, Left upper leg, Right lower leg, Left lower leg, Face         Bathing assist Assist Level: Minimal Assistance - Patient > 75%     Upper Body Dressing/Undressing Upper body dressing   What is the patient wearing?: Pull over shirt    Upper body assist Assist Level: Set up assist    Lower Body Dressing/Undressing Lower body dressing  What is the patient wearing?: Underwear/pull up, Pants     Lower body assist Assist for lower body dressing: Minimal Assistance - Patient > 75%     Toileting Toileting Toileting Activity did not occur Press photographer and hygiene only): N/A (no void or bm)  Toileting assist       Transfers Chair/bed transfer  Transfers assist     Chair/bed transfer assist level: Minimal Assistance - Patient > 75%     Locomotion Ambulation   Ambulation assist      Assist level: Minimal Assistance - Patient > 75% (L HHA) Assistive device: No Device Max distance: 60   Walk 10 feet activity   Assist     Assist level: Minimal Assistance - Patient > 75% Assistive device: No Device   Walk 50 feet activity   Assist     Assist level: Minimal Assistance - Patient > 75% Assistive device: No Device    Walk 150 feet activity   Assist Walk 150 feet activity did not occur: Safety/medical concerns         Walk 10 feet on uneven surface  activity   Assist Walk 10 feet on uneven surfaces activity did not occur: Safety/medical concerns         Wheelchair     Assist               Wheelchair 50 feet with 2 turns activity    Assist            Wheelchair 150 feet activity     Assist          Blood pressure (!) 148/80, pulse 78, temperature 98.4 F (36.9 C), temperature source Oral, resp. rate 18, height 5' 4.5" (1.638 m), weight 101.1 kg, SpO2 100%.   Medical Problem List and Plan: 1. 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             -patient may  shower             -ELOS/Goals: 7-10 days supervision  -Continue CIR   -Team conference today please see physician documentation under team conference tab, met with team  to discuss problems,progress, and goals. Formulized individual treatment plan based on medical history, underlying problem and comorbidities.   Resting hand splint ordered    2.  Antithrombotics: -DVT/anticoagulation:  Pharmaceutical: Lovenox             -antiplatelet therapy: Aspirin 81 mg daily and Plavix 75 mg daily x 3 weeks then aspirin alone (start date 11/03)   3. Pain Management: Tylenol as needed, Robaxin as needed muscle spasms   4. Mood/Behavior/Sleep: Melatonin 3 mg nightly, Xanax 0.25 mg twice daily as needed- not home medicine that I could tell- wasn't on meds at home at all.              -antipsychotic agents: N/A   5. Neuropsych/cognition: This patient is capable of making decisions on her own behalf.   6. Skin/Wound Care: Routine skin checks   7. Fluids/Electrolytes/Nutrition: Routine in and outs with follow-up chemistries   8.  Hypertension.  Norvasc 10 mg daily.   Monitor with increased mobility    06/18/2023   11:23 AM 06/18/2023   10:00 AM 06/18/2023    4:37 AM  Vitals with BMI  Weight  222 lbs 14 oz   BMI  37.68   Systolic 148  152  Diastolic 80  75  Pulse 78  71    BP a little elevated continue to monitor for now.  Monitor avoid any risk of hypotension   9.  Hyperlipidemia.  Lipitor   10.  Morbid Obesity.  BMI 38.60.  Dietary follow-up   11.  Tobacco use.  NicoDerm patch.  Provide counseling   12.  Constipation.  MiraLAX daily, Senokot S twice daily.  -Will change mirlax to PRN per pt request, LBM 11/4 documented   13. Needs PCP- hasn't seen physician in 7= years.   16. Prediabetes- educated on diagnosis and that needs to eat carbs in moderation.   -CBG stable 104    LOS: 1 days A FACE TO FACE EVALUATION WAS PERFORMED  Fanny Dance 06/18/2023, 12:12 PM

## 2023-06-18 NOTE — Progress Notes (Signed)
[facility] Individual Statement of Services  Patient Name:  Bryley Chrisman  Date:  06/18/2023  Welcome to the [facility].  Our goal is to provide you with an individualized program based on your diagnosis and situation, designed to meet your specific needs.  With this comprehensive rehabilitation program, you will be expected to participate in at least 3 hours of rehabilitation therapies Monday-Friday, with modified therapy programming on the weekends.  Your rehabilitation program will include the following services:  Physical Therapy (PT), Occupational Therapy (OT), 24 hour per day rehabilitation nursing, Therapeutic Recreaction (TR), Neuropsychology, Care Coordinator, Rehabilitation Medicine, Nutrition Services, and Pharmacy Services  Weekly team conferences will be held on Wednesday to discuss your progress.  Your Inpatient Rehabilitation Care Coordinator will talk with you frequently to get your input and to update you on team discussions.  Team conferences with you and your family in attendance may also be held.  Expected length of stay: 7-10 days  Overall anticipated outcome: mod/I level  Depending on your progress and recovery, your program may change. Your Inpatient Rehabilitation Care Coordinator will coordinate services and will keep you informed of any changes. Your Inpatient Rehabilitation Care Coordinator's name and contact numbers are listed  below.  The following services may also be recommended but are not provided by the [facility]:  Driving Evaluations Home Health Rehabiltiation Services Outpatient Rehabilitation Services Vocational Rehabilitation   Arrangements will be made to provide these services after discharge if needed.  Arrangements include referral to agencies that provide these services.  Your insurance has been verified to be:  Uninsured Your primary doctor is:  None  Pertinent information will be  shared with your doctor and your insurance company.  Inpatient Rehabilitation Care Coordinator:  Dossie Der, [place] [phone] or Luna [place]  Information discussed with and copy given to patient by: Lucy Chris, 06/18/2023, 10:13 AM

## 2023-06-18 NOTE — Progress Notes (Signed)
Orthopedic Tech Progress Note Patient Details:  Rebekah Mendez [birth year] [id number]  Patient ID: Rebekah Mendez, female   DOB: 11/18/1960, 62 y.o.   MRN: [id number] I called resting who order into [facility]. Trinna Post 06/18/2023, 2:08 PM

## 2023-06-18 NOTE — Plan of Care (Signed)
  Problem: RH Balance Goal: LTG Patient will maintain dynamic standing balance (PT) Description: LTG:  Patient will maintain dynamic standing balance with assistance during mobility activities (PT) Flowsheets (Taken 06/18/2023 1601) LTG: Pt will maintain dynamic standing balance during mobility activities with:: Independent with assistive device    Problem: Sit to Stand Goal: LTG:  Patient will perform sit to stand with assistance level (PT) Description: LTG:  Patient will perform sit to stand with assistance level (PT) Flowsheets (Taken 06/18/2023 1601) LTG: PT will perform sit to stand in preparation for functional mobility with assistance level: Independent with assistive device   Problem: RH Bed Mobility Goal: LTG Patient will perform bed mobility with assist (PT) Description: LTG: Patient will perform bed mobility with assistance, with/without cues (PT). Flowsheets (Taken 06/18/2023 1601) LTG: Pt will perform bed mobility with assistance level of: Independent with assistive device    Problem: RH Bed to Chair Transfers Goal: LTG Patient will perform bed/chair transfers w/assist (PT) Description: LTG: Patient will perform bed to chair transfers with assistance (PT). Flowsheets (Taken 06/18/2023 1601) LTG: Pt will perform Bed to Chair Transfers with assistance level: Independent with assistive device    Problem: RH Car Transfers Goal: LTG Patient will perform car transfers with assist (PT) Description: LTG: Patient will perform car transfers with assistance (PT). Flowsheets (Taken 06/18/2023 1601) LTG: Pt will perform car transfers with assist:: Set up assist    Problem: RH Furniture Transfers Goal: LTG Patient will perform furniture transfers w/assist (OT/PT) Description: LTG: Patient will perform furniture transfers  with assistance (OT/PT). Flowsheets (Taken 06/18/2023 1601) LTG: Pt will perform furniture transfers with assist:: Independent with assistive device    Problem: RH  Ambulation Goal: LTG Patient will ambulate in controlled environment (PT) Description: LTG: Patient will ambulate in a controlled environment, # of feet with assistance (PT). Flowsheets (Taken 06/18/2023 1601) LTG: Pt will ambulate in controlled environ  assist needed:: Independent with assistive device LTG: Ambulation distance in controlled environment: 150 Goal: LTG Patient will ambulate in home environment (PT) Description: LTG: Patient will ambulate in home environment, # of feet with assistance (PT). Flowsheets (Taken 06/18/2023 1601) LTG: Pt will ambulate in home environ  assist needed:: Independent with assistive device LTG: Ambulation distance in home environment: 75   Problem: RH Stairs Goal: LTG Patient will ambulate up and down stairs w/assist (PT) Description: LTG: Patient will ambulate up and down # of stairs with assistance (PT) Flowsheets (Taken 06/18/2023 1601) LTG: Pt will ambulate up/down stairs assist needed:: Supervision/Verbal cueing LTG: Pt will  ambulate up and down number of stairs: 2 steps with no handrails with LRAD  for home entry

## 2023-06-18 NOTE — Evaluation (Signed)
Physical Therapy Assessment and Plan  Patient Details  Name: Rebekah Mendez MRN: [id number] Date of Birth: 1960/10/05  PT Diagnosis: Abnormal posture, Abnormality of gait, Difficulty walking, Impaired sensation, and Muscle weakness Rehab Potential: Good ELOS: 7-10 days   Today's Date: 06/18/2023 PT Individual Time: 0900-1014, 4098-1191 PT Individual Time Calculation (min): 74 min, 57 min    Hospital Problem: Principal Problem:   Infarction of right basal ganglia (HCC)   Past Medical History:  Past Medical History:  Diagnosis Date   OSA (obstructive sleep apnea)    Thyroid activity decreased    late 20's   Past Surgical History:  Past Surgical History:  Procedure Laterality Date   CESAREAN SECTION     Feb '89   EXCISIONAL HEMORRHOIDECTOMY      Assessment & Plan Clinical Impression: Patient is a 62 y.o. year old female with unremarkable past medical history except tobacco use.  On no prescription medications and has not seen a physician in quite some time.  Per chart review patient lives with spouse.  1 level home one-step to entry.  Independent prior to admission working full-time.  Presented 06/14/2023 with acute onset of left-sided weakness along with slurred speech.  Noted blood pressure elevated systolic 225 on arrival.  Cranial CT scan negative.  Patient did receive TNK.  CT head and neck no intracranial large vessel occlusion or significant stenosis.  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  Small remote hemorrhagic lacunar infarct at the right lentiform nucleus.  Admission chemistries unremarkable except potassium 3.1, glucose 124, urine drug screen negative.  Echocardiogram with ejection fraction of 70 to 75% no wall motion abnormalities.  Neurology follow-up placed on low-dose aspirin and Plavix 75 mg daily x 3 weeks then aspirin alone.  Lovenox added for DVT prophylaxis and lower extremity Dopplers  negative for DVT.  She is tolerating a regular consistency diet.  Therapy evaluations completed due to patient decreased functional mobility left-sided weakness was admitted for a comprehensive rehab program.    Patient currently requires min with mobility secondary to muscle weakness, decreased cardiorespiratoy endurance, and decreased standing balance, hemiplegia, and decreased balance strategies.  Prior to hospitalization, patient was independent  with mobility, working full time and lived with Spouse Rebekah Mendez) in a House.   Home access is threshold , however pt has 2 steps to access bed room and bathroom.   Patient will benefit from skilled PT intervention to maximize safe functional mobility, minimize fall risk, and decrease caregiver burden for planned discharge home with 24 hour assist as needed .  Anticipate patient will benefit from follow up OP at discharge.  PT - End of Session Activity Tolerance: Tolerates 30+ min activity with multiple rests Endurance Deficit: Yes PT Assessment Rehab Potential (ACUTE/IP ONLY): Good PT Barriers to Discharge: Home environment access/layout;Weight PT Patient demonstrates impairments in the following area(s): Balance;Edema;Endurance;Motor;Safety;Sensory;Skin Integrity PT Transfers Functional Problem(s): Bed Mobility;Bed to Chair;Car;Furniture PT Locomotion Functional Problem(s): Ambulation;Stairs PT Plan PT Intensity: Minimum of 1-2 x/day ,45 to 90 minutes PT Frequency: 5 out of 7 days;Total of 15 hours over 7 days of combined therapies PT Duration Estimated Length of Stay: 7-10 days PT Treatment/Interventions: Ambulation/gait training;Discharge planning;Functional mobility training;Psychosocial support;Therapeutic Activities;Visual/perceptual remediation/compensation;Balance/vestibular training;Disease management/prevention;Skin care/wound management;Neuromuscular re-education;Therapeutic Exercise;Wheelchair propulsion/positioning;Cognitive  remediation/compensation;DME/adaptive equipment instruction;Pain management;Splinting/orthotics;UE/LE Strength taining/ROM;Community reintegration;Functional electrical stimulation;Patient/family education;Stair training;UE/LE Coordination activities PT Transfers Anticipated Outcome(s): mod I PT Locomotion Anticipated Outcome(s): mod I PT Recommendation Follow Up Recommendations: Outpatient PT Patient  destination: Home Equipment Recommended: To be determined Equipment Details: pt has RW and rollator from pt mom   PT Evaluation Precautions/Restrictions Precautions Precautions: Fall Restrictions Weight Bearing Restrictions: No Pain Interference Pain Interference Pain Effect on Sleep: 1. Rarely or not at all Pain Interference with Therapy Activities: 1. Rarely or not at all Pain Interference with Day-to-Day Activities: 1. Rarely or not at all Home Living/Prior Functioning Home Living Living Arrangements: Spouse/significant other Available Help at Discharge: Family;Available 24 hours/day Type of Home: House Entrance Stairs-Number of Steps: threshold Entrance Stairs-Rails: None Home Layout: One level Additional Comments: pt has 2 4 inch steps to get into bed room and bathroom with no handrails but can grab onto column  Lives With: Spouse (Lives with husband Rebekah Mendez) Prior Function Level of Independence: Independent with basic ADLs;Independent with gait;Independent with transfers  Able to Take Stairs?: Yes Driving: Yes 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) Vision/Perception  Vision - History Ability to See in Adequate Light: 0 Adequate  Cognition Overall Cognitive Status: Within Functional Limits for tasks assessed Orientation Level: Oriented X4 Year: 2024 Month: November Day of Week: Correct Memory: Appears intact Awareness: Appears intact Problem Solving: Appears intact Safety/Judgment: Appears intact Sensation Sensation Light Touch: Impaired by gross  assessment Hot/Cold: Not tested Proprioception: Impaired by gross assessment Stereognosis: Not tested Additional Comments: pt able to detect light touch in all deramtomes B, but reports slightly diminished sensation in L LE comapared to R LE, pt reports tingling in L LE with weight bearing. Coordination Gross Motor Movements are Fluid and Coordinated: No Finger Nose Finger Test: dysmetria L UE Heel Shin Test: L LE slower Motor  Motor Motor: Hemiplegia Motor - Skilled Clinical Observations: L Hemi  Trunk/Postural Assessment  Cervical Assessment Cervical Assessment: Within Functional Limits Thoracic Assessment Thoracic Assessment: Within Functional Limits Lumbar Assessment Lumbar Assessment: Within Functional Limits Postural Control Postural Control: Deficits on evaluation Righting Reactions: delayed  Balance Balance Balance Assessed: Yes Static Sitting Balance Static Sitting - Balance Support: Right upper extremity supported Static Sitting - Level of Assistance: 5: Stand by assistance Dynamic Sitting Balance Dynamic Sitting - Balance Support: Right upper extremity supported Static Standing Balance Static Standing - Level of Assistance: 4: Min assist;5: Stand by assistance Dynamic Standing Balance Dynamic Standing - Level of Assistance: 4: Min assist Extremity Assessment  RLE Assessment RLE Assessment: Within Functional Limits LLE Assessment LLE Assessment: Exceptions to [facility] Strength Comments: grossly 4+/5 in seated positioning  Care Tool Care Tool Bed Mobility Roll left and right activity   Roll left and right assist level: Supervision/Verbal cueing    Sit to lying activity   Sit to lying assist level: Supervision/Verbal cueing    Lying to sitting on side of bed activity   Lying to sitting on side of bed assist level: the ability to move from lying on the back to sitting on the side of the bed with no back support.: Supervision/Verbal cueing     Care Tool  Transfers Sit to stand transfer   Sit to stand assist level: Contact Guard/Touching assist    Chair/bed transfer   Chair/bed transfer assist level: Minimal Assistance - Patient > 75%    Car transfer   Car transfer assist level: Minimal Assistance - Patient > 75%      Care Tool Locomotion Ambulation   Assist level: Minimal Assistance - Patient > 75% (L HHA) Assistive device: No Device Max distance: 60  Walk 10 feet activity   Assist level: Minimal Assistance -  Patient > 75% Assistive device: No Device   Walk 50 feet with 2 turns activity   Assist level: Minimal Assistance - Patient > 75% Assistive device: No Device  Walk 150 feet activity Walk 150 feet activity did not occur: Safety/medical concerns      Walk 10 feet on uneven surfaces activity Walk 10 feet on uneven surfaces activity did not occur: Safety/medical concerns      Stairs   Assist level: Contact Guard/Touching assist Stairs assistive device: 2 hand rails Max number of stairs: 4  Walk up/down 1 step activity   Walk up/down 1 step (curb) assist level: Contact Guard/Touching assist Walk up/down 1 step or curb assistive device: 2 hand rails  Walk up/down 4 steps activity   Walk up/down 4 steps assist level: Contact Guard/Touching assist Walk up/down 4 steps assistive device: 2 hand rails  Walk up/down 12 steps activity Walk up/down 12 steps activity did not occur: Safety/medical concerns      Pick up small objects from floor     Min A     Wheelchair     Total A         Wheel 50 feet with 2 turns activity  Total A     Wheel 150 feet activity  Total A       Refer to Care Plan for Long Term Goals  SHORT TERM GOAL WEEK 1 PT Short Term Goal 1 (Week 1): Pt will ambulate 150 feet with LRAD and CGA or less PT Short Term Goal 2 (Week 1): Pt will ascend/descend 2 6 inch steps with no handrails and LRAD with CGA or less PT Short Term Goal 3 (Week 1): Pt will performed stand pivot transfer with LRAD and  supervision  Recommendations for other services: None   Skilled Therapeutic Intervention Mobility Bed Mobility Bed Mobility: Rolling Right;Rolling Left;Supine to Sit Rolling Right: Supervision/verbal cueing Rolling Left: Supervision/Verbal cueing Supine to Sit: Supervision/Verbal cueing Transfers Transfers: Sit to Stand;Stand to Sit;Stand Pivot Transfers Sit to Stand: Contact Guard/Touching assist Stand to Sit: Contact Guard/Touching assist Stand Pivot Transfers: Minimal Assistance - Patient > 75% Stand Pivot Transfer Details: Verbal cues for precautions/safety Transfer (Assistive device): None Locomotion  Gait Ambulation: Yes Gait Assistance: Minimal Assistance - Patient > 75% Gait Distance (Feet): 60 Feet Assistive device: None Gait Assistance Details: Verbal cues for precautions/safety;Verbal cues for gait pattern;Verbal cues for safe use of DME/AE Gait Assistance Details: L HHA Gait Gait: Yes Gait Pattern: Impaired Gait Pattern: Step-to pattern;Decreased step length - right;Decreased stance time - left;Decreased stride length;Decreased hip/knee flexion - left;Decreased weight shift to left;Lateral trunk lean to left Gait velocity: decr Stairs / Additional Locomotion Stairs: Yes Stairs Assistance: Contact Guard/Touching assist Stair Management Technique: Two rails Number of Stairs: 6 Height of Stairs: 4 Wheelchair Mobility Wheelchair Mobility: Yes Wheelchair Assistance: Total Assistance - Patient <25% Wheelchair Parts Management: Needs assistance   Discharge Criteria: Patient will be discharged from PT if patient refuses treatment 3 consecutive times without medical reason, if treatment goals not met, if there is a change in medical status, if patient makes no progress towards goals or if patient is discharged from hospital.  The above assessment, treatment plan, treatment alternatives and goals were discussed and mutually agreed upon: by patient  Today's  Interventions  Treatment Session 1   Pt supine in bed upon arrival. Pt agreeable to therapy. Pt denies any pain.   Evaluation completed (see details above and below) with education on PT POC and goals and  individual treatment initiated with focus on gait training. Education on Pt evaluation, CIR policies and therapy schedule.   Pt performed evaluation components with no AD with L HHA 2/2 instability. See above. Pt ambulated ~50 feet with RW and min A for maintenance of L UE on RW and management of RW. Verbal cues provided for technique.   Pt ascended/descended 4 6 inch steps with B UE support on handrail and step to gait, with CGA regardless of which pt lead with for ascent/descent.      Treatment Session 2   Pt supine in bed upon arrival. Pt agreeable to therapy. Pt denies any pain, but reports fatigue from previous sessions.   Treatment Session focused on neuromuscular reeducation. Pt performed the following to increase weight shift to L LE, and coordination/strength of L LE.   1x10 sit to stand with R LE on airex pad 2x5 mini squats with min-light mod A 2/2 1 episode of buckling 1x5 standing hip abduction B with B UE support on // bars 1x5 standing hip extension B with B UE support on // bars 1x5 standing alternating marching B with B UE support on // bars  Lateral stepping 2x20 feet with CGA Backward stepping 2x20 feet with CGA  Pt supine in bed with all needs within reach and bed alarm on.    [facility] [place], [place], DPT  06/18/2023, 10:13 AM

## 2023-06-18 NOTE — Progress Notes (Signed)
Patient ID: Rebekah Mendez, female   DOB: [birth year], 61 y.o.   MRN: [id number]  Met with pt and daughter to inform of team conference update with goals of mod/I level and target discharge date of 11/15. She did well with her first two therapies and she is encouraged by this. Needs to work on stairs since she has two to get into bedroom at home with no rail. Will continue to work on discharge needs.

## 2023-06-18 NOTE — Progress Notes (Signed)
Inpatient Rehabilitation  Patient information reviewed and entered into eRehab system by Oyuki Hogan M. Eri Mcevers, M.A., CCC/SLP, PPS Coordinator.  Information including medical coding, functional ability and quality indicators will be reviewed and updated through discharge.    

## 2023-06-19 DIAGNOSIS — T801XXD Vascular complications following infusion, transfusion and therapeutic injection, subsequent encounter: Secondary | ICD-10-CM

## 2023-06-19 NOTE — Progress Notes (Signed)
PROGRESS NOTE   Subjective/Complaints: No acute events overnight noted.  She reports she is making good progress with therapy.  She does have some swelling in her left AC from prior IV infiltration.  This is not particularly red or warm today, not causing any significant pain.  ROS: Patient denies fever, chills, rash, sore throat, blurred vision, dizziness, nausea, vomiting, diarrhea, cough, shortness of breath or chest pain.  No new motor or sensory changes noted.    Objective:   No results found. Recent Labs    06/17/23 1330 06/18/23 0518  WBC 6.9 6.3  HGB 12.0 12.2  HCT 37.5 38.0  PLT 256 256   Recent Labs    06/17/23 0452 06/18/23 0518  NA 138 139  K 3.5 3.7  CL 106 105  CO2 23 24  GLUCOSE 101* 104*  BUN 14 14  CREATININE 0.64 0.63  CALCIUM 8.5* 8.7*    Intake/Output Summary (Last 24 hours) at 06/19/2023 1253 Last data filed at 06/19/2023 1200 Gross per 24 hour  Intake 480 ml  Output --  Net 480 ml        Physical Exam: Vital Signs Blood pressure (!) 159/90, pulse 69, temperature 98 F (36.7 C), temperature source Oral, resp. rate 18, height 5' 4.5" (1.638 m), weight 101.1 kg, SpO2 98%.  Physical Exam Vitals and nursing note reviewed. Exam conducted with a chaperone present.    General:  No apparent distress HEENT: Head is normocephalic, atraumatic, mucous membranes moist Heart: Reg rate and rhythm. No murmurs rubs or gallops Chest: CTA bilaterally without wheezes, rales, or rhonchi; no distress Abdomen: Soft, non-tender, non-distended, bowel sounds positive. Extremities: No clubbing, cyanosis, or edema. Pulses are 2+ Psych: Pt's affect is appropriate. Pt is cooperative Skin:  Knot in L AC fossa size of medium strawberry from infiltration of IV-no significant warmth or redness noted, slightly smaller than prior Neuro: Alert and awake,  follows simple commands,  Speech is a bit dysarthric but  intelligible. L Facial weakness when smiling   Fair insight and awareness. Dysarthria appears to be from sensory deficit.  L side has decreased sensation to light touch throughout   Musculoskeletal:     Cervical back: Neck supple. No tenderness.     Comments: RUE 5/5 LUE biceps 4/5; Triceps 4/5; WE 4-/5; grip 4-/5 and FA 2-/5 RLE- 5/5 LLE- proximally 4/5 and distally 4+/5     Assessment/Plan: 1. Functional deficits which require 3+ hours per day of interdisciplinary therapy in a comprehensive inpatient rehab setting. Physiatrist is providing close team supervision and 24 hour management of active medical problems listed below. Physiatrist and rehab team continue to assess barriers to discharge/monitor patient progress toward functional and medical goals  Care Tool:  Bathing    Body parts bathed by patient: Left arm, Chest, Abdomen, Front perineal area, Buttocks, Right upper leg, Left upper leg, Left lower leg, Face, Right lower leg   Body parts bathed by helper: Right arm     Bathing assist Assist Level: Minimal Assistance - Patient > 75%     Upper Body Dressing/Undressing Upper body dressing   What is the patient wearing?: Pull over shirt  Upper body assist Assist Level: Set up assist    Lower Body Dressing/Undressing Lower body dressing      What is the patient wearing?: Underwear/pull up, Pants     Lower body assist Assist for lower body dressing: Minimal Assistance - Patient > 75%     Toileting Toileting Toileting Activity did not occur (Clothing management and hygiene only): N/A (no void or bm)  Toileting assist       Transfers Chair/bed transfer  Transfers assist     Chair/bed transfer assist level: Minimal Assistance - Patient > 75%     Locomotion Ambulation   Ambulation assist      Assist level: Minimal Assistance - Patient > 75% Assistive device: No Device Max distance: 150+   Walk 10 feet activity   Assist     Assist level:  Minimal Assistance - Patient > 75% Assistive device: No Device   Walk 50 feet activity   Assist    Assist level: Minimal Assistance - Patient > 75% Assistive device: No Device    Walk 150 feet activity   Assist Walk 150 feet activity did not occur: Safety/medical concerns  Assist level: Contact Guard/Touching assist Assistive device: No Device    Walk 10 feet on uneven surface  activity   Assist Walk 10 feet on uneven surfaces activity did not occur: Safety/medical concerns         Wheelchair     Assist Is the patient using a wheelchair?: Yes Type of Wheelchair: Manual    Wheelchair assist level: Minimal Assistance - Patient > 75% Max wheelchair distance: 100    Wheelchair 50 feet with 2 turns activity    Assist        Assist Level: Minimal Assistance - Patient > 75%   Wheelchair 150 feet activity     Assist      Assist Level: Moderate Assistance - Patient 50 - 74%   Blood pressure (!) 159/90, pulse 69, temperature 98 F (36.7 C), temperature source Oral, resp. rate 18, height 5' 4.5" (1.638 m), weight 101.1 kg, SpO2 98%.   Medical Problem List and Plan: 1. 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             -patient may  shower             -ELOS/Goals: 7-10 days supervision  -Continue CIR   -Expected discharge 11/15  Resting hand splint ordered    2.  Antithrombotics: -DVT/anticoagulation:  Pharmaceutical: Lovenox             -antiplatelet therapy: Aspirin 81 mg daily and Plavix 75 mg daily x 3 weeks then aspirin alone (start date 11/03)   3. Pain Management: Tylenol as needed, Robaxin as needed muscle spasms   4. Mood/Behavior/Sleep: Melatonin 3 mg nightly, Xanax 0.25 mg twice daily as needed- not home medicine that I could tell- wasn't on meds at home at all.              -antipsychotic agents: N/A   5. Neuropsych/cognition: This patient is capable of making  decisions on her own behalf.   6. Skin/Wound Care: Routine skin checks  -Monitor swelling site of prior IV infiltration. No signs of infection noted today. Continue warm compress    7. Fluids/Electrolytes/Nutrition: Routine in and outs with follow-up chemistries   8.  Hypertension.  Norvasc 10 mg daily.  Monitor with increased mobility    06/19/2023   12:44  PM 06/19/2023    4:36 AM 06/18/2023    7:14 PM  Vitals with BMI  Systolic 159 143 962  Diastolic 90 69 69  Pulse 69 71 72    11/7 BP intermittently mildly elevated.  Consider additional medication tomorrow if this does not improve   9.  Hyperlipidemia.  Lipitor   10.  Morbid Obesity.  BMI 38.60.  Dietary follow-up   11.  Tobacco use.  NicoDerm patch- discontinued.  Provide counseling   12.  Constipation.  MiraLAX daily, Senokot S twice daily.  -Will change mirlax to PRN per pt request,  -LBM 11/7- today, improved   13. Needs PCP- hasn't seen physician in 7= years.   16. Prediabetes- educated on diagnosis and that needs to eat carbs in moderation.   -CBG stable 104    LOS: 2 days A FACE TO FACE EVALUATION WAS PERFORMED  Fanny Dance 06/19/2023, 12:53 PM

## 2023-06-19 NOTE — Progress Notes (Signed)
Physical Therapy Session Note  Patient Details  Name: Rebekah Mendez MRN: [id number] Date of Birth: [birth year]  Today's Date: 06/19/2023 PT Individual Time: 4098-1191 PT Individual Time Calculation (min): 85 min   Short Term Goals: Week 1:  PT Short Term Goal 1 (Week 1): Pt will ambulate 150 feet with LRAD and CGA or less PT Short Term Goal 2 (Week 1): Pt will ascend/descend 2 6 inch steps with no handrails and LRAD with CGA or less PT Short Term Goal 3 (Week 1): Pt will performed stand pivot transfer with LRAD and supervision  Skilled Therapeutic Interventions/Progress Updates:      Pt seated in WC upon arrival. Pt agreeable to therapy. Pt denies any pain. Pt reports mild R knee soreness after session yesterday.   Treatment session focused on static and dynamic standing balance, independence with stair navigation, neuromuscular re-education, and use of L UE with functional tasks.   Pt Ascended/descended 2x8 3 inch steps with no UE support, and step to gait with min A with intermittent use of unilateral UE on handrail for stabilization 2/2 LOS, verbal cues provided for L LE hip and knee flexion, and L DF  for increased foot clearance with ascending.   Pt ambulated 1x111, 1x150+  feet with no AD and CGA/light min A with no UE support, verbal and tactile cues provided for reciprocal gait and heel toe pattern, with L hip and knee flexion for advancement   Pt self propelled WC 100 feet with B UE with min A for correction of veering to L, verbal cues provided for technique.   Pt participated in multiple fall festival games located in the gym during 1 on 1 session with therapist to improve weight shift to L LE, static and dynamic standing balance, and increased use of L UE   Pt stood for ~5 mins with CGA verbal and tactile cues provided for weight shift to L LE, while gripping ping pong ball x~15 to play plinko game  Pt stood with CGA to min A while leaning over/performing mini squat to grab  bean bag (positioned on L side of body) with L UE and tossing beanbag x5 with L UE  onto cornhole board with CGA  Pt stood with CGA while performing ring toss x5 with L UE, while reaching across body with R UE to pick up cones with CGA for stability.    Pt performed sit to stand and stand pivot transfer with no AD and CGA throughout session.   Pt supine in bed at end of session with bed alarm on and needs within reach.         Therapy Documentation Precautions:  Precautions Precautions: Fall Precaution Comments: BP <180/105, L-hemi Restrictions Weight Bearing Restrictions: No  Therapy/Group: Individual Therapy  [facility] [place], [place], DPT  06/19/2023, 7:40 AM

## 2023-06-19 NOTE — Progress Notes (Signed)
Occupational Therapy Session Note  Patient Details  Name: Rebekah Mendez MRN: [id number] Date of Birth: February 24, 1961  Today's Date: 06/19/2023 OT Individual Time: 0800-0900, 4098-1191  OT Individual Time Calculation (min): 60 min, 73 min    Short Term Goals: Week 1:  OT Short Term Goal 1 (Week 1): STGs=LTGs due to patient's estimated length of stay.  Skilled Therapeutic Interventions/Progress Updates:    Session 1:  Pt seen for full am self care session. Pt deferring shower since showered yesterday. Sink side bathing, grooming and dressing following toileting performed. Pt amb bed to and from bathroom with RW with L wrist splint in place with CGA. Peri hygiene with set up and buttocks bathing as well. Pt donned clean LB garments toilet level with min cues and min A to reach to feet as well as trial with figure 4 for sock donning with most likely need for reacher and sock aide training next session as mod A required. Sink side oral and hair care with facilitation to use L hand with min A but overall set up with R UE. Min cues for hemi techniques for pull over shirt and set up. OT transported pt in new 20" w/c with 15 ft self propulsion instruction with min A and cues.  Completed grip strength test with 60 lbs R; 16 lbs L; unable to complete 9HPT L hand. Once back in room, pt remained in w/c with all needs, nurse call button and lap alarm belt set.   Pain: denies pain except l IV site soreness    Session 2:  Pt seen for 2nd session with focus on L sided NMRE and integration into function. Pt's dtrs pesent bedside and pt amb from EOB to w/c with CGA with cues for L sided control 15 ft to w/c. OT transported pt for time management from room to main gym. Pt used L UE as stabilizer assist to don pillow case. Pt transferred with CGA to mat and moved to supine on wedge with min cues and close S. Applied Sa[employer] StimOne to L wrist extensors for 10 min interval and added 1 lb weighted bar for B scap/sh  press, rolling forward/backward and up overhead with full grasp on L side with min cues only. 10 reps with cues for control and occ min a. Once seated at EOM, theraball for weight bearing for sh flex/ex then horiz sh abd/add 10 reps each. Pt then able to place 20 large pegs into board with l hand with cues for decreasing compensatory L shoulder and 55-60% accuracy to place pegs. Removed with mirror to assist with mvmt quality and 75% accuracy. W/c propulsion 50 ft with increased time and min A for L sided control and then amb the rest of the way back to room with CGA. Once back in bed, bed alarm set, needs and nurse call button placed in reach.   Saebo StimOne parameters:    no adverse skin reactions.  330 pulse width 35 Hz pulse rate On 8 sec/ off 8 sec Ramp up/ down 2 sec Symmetrical Biphasic wave form  Max intensity at 500 Ohm load  Pain: denies pain  Therapy Documentation Precautions:  Precautions Precautions: Fall Precaution Comments: BP <180/105, L-hemi Restrictions Weight Bearing Restrictions: No   Therapy/Group: Individual Therapy  Vicenta Dunning 06/19/2023, 7:58 AM

## 2023-06-20 DIAGNOSIS — R1310 Dysphagia, unspecified: Secondary | ICD-10-CM

## 2023-06-20 LAB — ANA W/REFLEX IF POSITIVE: Anti Nuclear Antibody (ANA): NEGATIVE

## 2023-06-20 MED ORDER — SENNOSIDES-DOCUSATE SODIUM 8.6-50 MG PO TABS
1.0000 | ORAL_TABLET | Freq: Every day | ORAL | Status: DC
  Administered 2023-06-21 – 2023-06-25 (×5): 1 via ORAL
  Filled 2023-06-20 (×5): qty 1

## 2023-06-20 MED ORDER — LISINOPRIL 5 MG PO TABS
2.5000 mg | ORAL_TABLET | Freq: Every day | ORAL | Status: DC
  Administered 2023-06-20 – 2023-06-26 (×7): 2.5 mg via ORAL
  Filled 2023-06-20 (×7): qty 1

## 2023-06-20 NOTE — Progress Notes (Addendum)
PROGRESS NOTE   Subjective/Complaints: No acute events noted overnight.  She reports her pain is under control.  She reports some swallowing difficulty where she felt like food will get stuck in her throat.  ROS: Patient denies fever, chills, rash, sore throat, blurred vision, dizziness, nausea, vomiting, diarrhea, cough, shortness of breath or chest pain.  No new motor or sensory changes noted.  + Dysphagia    Objective:   No results found. Recent Labs    06/18/23 0518  WBC 6.3  HGB 12.2  HCT 38.0  PLT 256   Recent Labs    06/18/23 0518  NA 139  K 3.7  CL 105  CO2 24  GLUCOSE 104*  BUN 14  CREATININE 0.63  CALCIUM 8.7*    Intake/Output Summary (Last 24 hours) at 06/20/2023 1549 Last data filed at 06/20/2023 1316 Gross per 24 hour  Intake 716 ml  Output --  Net 716 ml        Physical Exam: Vital Signs Blood pressure (!) 151/89, pulse 77, temperature 98 F (36.7 C), resp. rate 18, height 5' 4.5" (1.638 m), weight 101.1 kg, SpO2 99%.  Physical Exam Vitals and nursing note reviewed. Exam conducted with a chaperone present.    General:  No apparent distress HEENT: Head is normocephalic, atraumatic, mucous membranes moist Heart: Reg rate and rhythm. No murmurs rubs or gallops Chest: CTA bilaterally without wheezes, rales, or rhonchi; no distress Abdomen: Soft, non-tender, non-distended, bowel sounds positive. Extremities: No clubbing, cyanosis, or edema. Pulses are 2+ Psych: Pt's affect is appropriate. Pt is cooperative Skin:  Knot in L AC fossa with area of swelling from infiltration of IV-no significant warmth or redness noted, decreased in size from yesterday Neuro: Alert and awake,  follows simple commands,  L Facial weakness when smiling Fair insight and awareness. Dysarthria appears to be from sensory deficit.  L side has decreased sensation to light touch throughout   Musculoskeletal:      Cervical back: Neck supple. No tenderness.     Comments: RUE 5/5 LUE biceps 4/5; Triceps 4/5; WE 4-/5; grip 4-/5 and FA 2-/5 RLE- 5/5 LLE- proximally 4/5 and distally 4+/5     Assessment/Plan: 1. Functional deficits which require 3+ hours per day of interdisciplinary therapy in a comprehensive inpatient rehab setting. Physiatrist is providing close team supervision and 24 hour management of active medical problems listed below. Physiatrist and rehab team continue to assess barriers to discharge/monitor patient progress toward functional and medical goals  Care Tool:  Bathing    Body parts bathed by patient: Left arm, Chest, Abdomen, Front perineal area, Buttocks, Right upper leg, Left upper leg, Left lower leg, Face, Right lower leg, Right arm   Body parts bathed by helper: Right arm     Bathing assist Assist Level: Supervision/Verbal cueing     Upper Body Dressing/Undressing Upper body dressing   What is the patient wearing?: Pull over shirt    Upper body assist Assist Level: Independent    Lower Body Dressing/Undressing Lower body dressing      What is the patient wearing?: Underwear/pull up, Pants     Lower body assist Assist for lower body  dressing: Supervision/Verbal cueing     Toileting Toileting Toileting Activity did not occur Press photographer and hygiene only): N/A (no void or bm)  Toileting assist Assist for toileting: Supervision/Verbal cueing     Transfers Chair/bed transfer  Transfers assist     Chair/bed transfer assist level: Contact Guard/Touching assist     Locomotion Ambulation   Ambulation assist      Assist level: Contact Guard/Touching assist Assistive device: No Device Max distance: >178ft   Walk 10 feet activity   Assist     Assist level: Contact Guard/Touching assist Assistive device: No Device   Walk 50 feet activity   Assist    Assist level: Contact Guard/Touching assist Assistive device: No Device     Walk 150 feet activity   Assist Walk 150 feet activity did not occur: Safety/medical concerns  Assist level: Contact Guard/Touching assist Assistive device: No Device    Walk 10 feet on uneven surface  activity   Assist Walk 10 feet on uneven surfaces activity did not occur: Safety/medical concerns         Wheelchair     Assist Is the patient using a wheelchair?: Yes Type of Wheelchair: Manual    Wheelchair assist level: Minimal Assistance - Patient > 75% Max wheelchair distance: 100    Wheelchair 50 feet with 2 turns activity    Assist        Assist Level: Minimal Assistance - Patient > 75%   Wheelchair 150 feet activity     Assist      Assist Level: Moderate Assistance - Patient 50 - 74%   Blood pressure (!) 151/89, pulse 77, temperature 98 F (36.7 C), resp. rate 18, height 5' 4.5" (1.638 m), weight 101.1 kg, SpO2 99%.   Medical Problem List and Plan: 1. 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             -patient may  shower             -ELOS/Goals: 7-10 days supervision  -Continue CIR   -Expected discharge 11/15  Resting hand splint ordered- reports she is using this at night    2.  Antithrombotics: -DVT/anticoagulation:  Pharmaceutical: Lovenox             -antiplatelet therapy: Aspirin 81 mg daily and Plavix 75 mg daily x 3 weeks then aspirin alone (start date 11/03)   3. Pain Management: Tylenol as needed, Robaxin as needed muscle spasms   4. Mood/Behavior/Sleep: Melatonin 3 mg nightly, Xanax 0.25 mg twice daily as needed- not home medicine that I could tell- wasn't on meds at home at all.              -antipsychotic agents: N/A   5. Neuropsych/cognition: This patient is capable of making decisions on her own behalf.   6. Skin/Wound Care: Routine skin checks  -Monitor swelling site of prior IV infiltration. No signs of infection noted today. Continue warm compress  . Appears to be gradually improving   7. Fluids/Electrolytes/Nutrition: Routine in and outs with follow-up chemistries   8.  Hypertension.  Norvasc 10 mg daily.  Monitor with increased mobility    06/20/2023    1:16 PM 06/20/2023    4:56 AM 06/19/2023    7:41 PM  Vitals with BMI  Systolic 151 152 811  Diastolic 89 74 83  Pulse 77 70 69    11/8 start lisinopril 2.5mg  daily   9.  Hyperlipidemia.  Lipitor   10.  Morbid Obesity.  BMI 38.60.  Dietary follow-up   11.  Tobacco use.  NicoDerm patch- discontinued.  Provide counseling   12.  Constipation.  MiraLAX daily, Senokot S twice daily.  -Will change mirlax to PRN per pt request,  -LBM 11/7- Continue to monitor    13. Needs PCP- hasn't seen physician in 7= years.   16. Prediabetes- educated on diagnosis and that needs to eat carbs in moderation.   -CBG stable 104  17. Dysphagia  -Diet adjusted to D3 thin, will consult SLP  -Continue PPI    LOS: 3 days A FACE TO FACE EVALUATION WAS PERFORMED  Fanny Dance 06/20/2023, 3:49 PM

## 2023-06-20 NOTE — Progress Notes (Signed)
Physical Therapy Session Note  Patient Details  Name: Rebekah Mendez MRN: [id number] Date of Birth: [birth year]  Today's Date: 06/20/2023 PT Individual Time: 1660-6301 PT Individual Time Calculation (min): 27 min   Short Term Goals: Week 1:  PT Short Term Goal 1 (Week 1): Pt will ambulate 150 feet with LRAD and CGA or less PT Short Term Goal 2 (Week 1): Pt will ascend/descend 2 6 inch steps with no handrails and LRAD with CGA or less PT Short Term Goal 3 (Week 1): Pt will performed stand pivot transfer with LRAD and supervision  Skilled Therapeutic Interventions/Progress Updates:   Received pt semi-reclined in bed, pt agreeable to PT treatment, and denied any pain during session. Session with emphasis on functional mobility/transfers, generalized strengthening and endurance, and gait training. Pt transferred semi-reclined<>sitting R EOB with HOB elevated and supervision. Donned slip on shoes without assist and performed all transfers without AD and CGA/close supervision throughout session.   Pt ambulated in/out of bathroom without AD and CGA and able to manage clothing/void/and perform hygiene management without assist. Stood at sink and washed hands/brushed teeth with distant supervision. Pt then ambulated 147ft without AD and CGA to dayroom. Pt performed seated BLE strengthening on Kinetron at 20 cm/sec for 3 minutes with emphasis on glute/quad strength. Pt ambulated additional >260ft without AD and CGA fading to close supervision with 2 standing rest breaks - noted mild L toe catching and cued pt to avoid "scuffing" L foot. Pt fatigued at end of session but pleased with progress here in rehab. Concluded session with pt sitting in recliner with all needs within reach, awaiting upcoming OT session.   Therapy Documentation Precautions:  Precautions Precautions: Fall Precaution Comments: BP <180/105, L-hemi Restrictions Weight Bearing Restrictions: No  Therapy/Group: Individual Therapy Marlana Salvage Zaunegger Blima Rich PT, DPT 06/20/2023, 6:59 AM

## 2023-06-20 NOTE — Progress Notes (Signed)
Physical Therapy Session Note  Patient Details  Name: Rebekah Mendez MRN: [id number] Date of Birth: [birth year]  Today's Date: 06/20/2023 PT Individual Time: 1310-1408 PT Individual Time Calculation (min): 58 min   Short Term Goals: Week 1:  PT Short Term Goal 1 (Week 1): Pt will ambulate 150 feet with LRAD and CGA or less PT Short Term Goal 2 (Week 1): Pt will ascend/descend 2 6 inch steps with no handrails and LRAD with CGA or less PT Short Term Goal 3 (Week 1): Pt will performed stand pivot transfer with LRAD and supervision  Skilled Therapeutic Interventions/Progress Updates: Patient in bathroom with NT assisting with personal care (daughter present) on entrance to room. Patient alert and agreeable to PT session.   Patient reported no pain at beginning of PT session. Pt educated on trigger point release as OT discussed with this PTA earlier in day that pt could benefit from scapular mobilization (after palpating B UE traps, pt noted to have increased tightness and knots in L upper trap musculature vs R). PTA cleared daughter to assist pt with transfers in room (furniture/toilet) with pt in RW and gait belt donned. Daughter assisted pt with CGA from [facility] to EOB with no issue and only informed to keep close to pt with hand on gait belt for safety. Pt and daughter with no further questions or concerns regarding transfers in room.   Therapeutic Activity: Transfers: Pt performed sit<>stand transfers throughout session with supervision (with and without AD). No VC required.   Neuromuscular Re-ed: NMR facilitated during session with focus on. - Row with yellow theraband on L UE (band tied to // bar). Pt with manual facilitation of L shoulder into depression vs elevation to decrease shoulder/upper trap compensation. Pt performed into fatigue while sitting in WC - L shoulder abduction with PTA assisting L scapula to increase scapular rhythm coordination.  - Ambulation in main gym without AD and  with L HHA and PTA calling out commands for pt to look in various directions while maintaining anterior steps. Pt with slight increase in unsteadiness when looking up towards ceiling. Pt progressed to ambulation without HHA and presented with decreased reciprocal arm swing bilaterally but with descent cadence throughout. Pt also performed lateral steps to R and L as well as retro steps all with CGA for safety and VC to maintain B LE facing towards mirror in front to avoid hip flexor compensation.   NMR performed for improvements in motor control and coordination, balance, sequencing, judgement, and self confidence/ efficacy in performing all aspects of mobility at highest level of independence.   Manual Therapy: - Trigger point release to L upper trap followed by soft tissue mobilization. Pt tolerated treatment well with no issue or redness noted.  Patient sitting EOB at end of session with brakes locked, daughter present and all needs within reach.      Therapy Documentation Precautions:  Precautions Precautions: Fall Precaution Comments: BP <180/105, L-hemi Restrictions Weight Bearing Restrictions: No   Therapy/Group: Individual Therapy  Valiant Dills PTA 06/20/2023, 3:19 PM

## 2023-06-20 NOTE — IPOC Note (Signed)
Overall Plan of Care [facility]) Patient Details Name: Rebekah Mendez MRN: [id number] DOB: 1961-07-12  Admitting Diagnosis: Infarction of right basal ganglia [facility])  Hospital Problems: Principal Problem:   Infarction of right basal ganglia (HCC)     Functional Problem List: Nursing Bowel, Bladder, Safety, Endurance, Medication Management  PT Balance, Edema, Endurance, Motor, Safety, Sensory, Skin Integrity  OT Balance, Endurance, Motor, Pain, Perception, Safety, Sensory  SLP    TR         Basic ADL's: OT Eating, Grooming, Bathing, Dressing, Toileting     Advanced  ADL's: OT       Transfers: PT Bed Mobility, Bed to Chair, Car, Occupational psychologist, Research scientist (life sciences): PT Ambulation, Stairs     Additional Impairments: OT Fuctional Use of Upper Extremity  SLP        TR      Anticipated Outcomes Item Anticipated Outcome  Self Feeding Mod I  Swallowing      Basic self-care  Mod I  Toileting  Mod I   Bathroom Transfers Mod I  Bowel/Bladder  manage bowel w mod I and bladder w toileting  Transfers  mod I  Locomotion  mod I  Communication     Cognition     Pain  n/a  Safety/Judgment  manage w cues   Therapy Plan: PT Intensity: Minimum of 1-2 x/day ,45 to 90 minutes PT Frequency: 5 out of 7 days, Total of 15 hours over 7 days of combined therapies PT Duration Estimated Length of Stay: 7-10 days OT Intensity: Minimum of 1-2 x/day, 45 to 90 minutes OT Frequency: 5 out of 7 days OT Duration/Estimated Length of Stay: 7-10 days     Team Interventions: Nursing Interventions Patient/Family Education, Bladder Management, Medication Management, Discharge Planning, Bowel Management, Disease Management/Prevention  PT interventions Ambulation/gait training, Discharge planning, Functional mobility training, Psychosocial support, Therapeutic Activities, Visual/perceptual remediation/compensation, Balance/vestibular training, Disease management/prevention, Skin  care/wound management, Neuromuscular re-education, Therapeutic Exercise, Wheelchair propulsion/positioning, Cognitive remediation/compensation, DME/adaptive equipment instruction, Pain management, Splinting/orthotics, UE/LE Strength taining/ROM, Community reintegration, Development worker, international aid stimulation, Patient/family education, Museum/gallery curator, UE/LE Coordination activities  OT Interventions Warden/ranger, Cognitive remediation/compensation, Firefighter, Discharge planning, Disease mangement/prevention, Fish farm manager, Functional electrical stimulation, Functional mobility training, Neuromuscular re-education, Pain management, Patient/family education, Psychosocial support, Self Care/advanced ADL retraining, Skin care/wound managment, Splinting/orthotics, Therapeutic Activities, Therapeutic Exercise, UE/LE Strength taining/ROM, UE/LE Coordination activities, Visual/perceptual remediation/compensation, Wheelchair propulsion/positioning  SLP Interventions    TR Interventions    SW/CM Interventions Discharge Planning, Psychosocial Support, Patient/Family Education   Barriers to Discharge MD  Medical stability  Nursing Decreased caregiver support 1 level w threshold no rails w spouse  PT Home environment access/layout, Weight    OT Home environment access/layout, Weight    SLP      SW Insurance for SNF coverage     Team Discharge Planning: Destination: PT-Home ,OT- Home , SLP-  Projected Follow-up: PT-Outpatient PT, OT-  Home health OT, SLP-  Projected Equipment Needs: PT-To be determined, OT- To be determined, SLP-  Equipment Details: PT-pt has RW and rollator from pt mom, OT-  Patient/family involved in discharge planning: PT- Patient,  OT-Patient, Family member/caregiver, SLP-   MD ELOS: 7-10 days Medical Rehab Prognosis:  Excellent Assessment: The patient has been admitted for CIR therapies with the diagnosis of right basal ganglia infarction  with L hemiparesis Status post TNK. . The team will be addressing functional mobility, strength, stamina, balance, safety, adaptive techniques and equipment, self-care, bowel and  bladder mgt, patient and caregiver education. Goals have been set at Mod I. Anticipated discharge destination is Home.        See Team Conference Notes for weekly updates to the plan of care

## 2023-06-20 NOTE — Progress Notes (Signed)
Occupational Therapy Session Note  Patient Details  Name: Rebekah Mendez MRN: [id number] Date of Birth: [birth year]  Today's Date: 06/20/2023 OT Individual Time: 1100-1145 OT Individual Time Calculation (min): 45 min    Short Term Goals: Week 1:  OT Short Term Goal 1 (Week 1): STGs=LTGs due to patient's estimated length of stay.  Skilled Therapeutic Interventions/Progress Updates: Patient received resting in bed. Agreeable to OT treatment with focus on NMRE of the LUE. Sitting EOB working on LUE scapular mobility into abduction, depression and upward rotation. Patient with significant tightness in all planes. Followed with pre-reach tasks standing at door frame to work on triceps activation at shoulder height for pre-reach motion and triceps isolating movement with moderate cues to initiate and prevent scapular elevation. Followed with hand slides against the wall with facilitation at wrist and elbow to ensure smooth movement without over use of scapular elevation or pectoralis. Seated FM training activities working on Conservation officer, nature and manipulation of small foam pieces. And isolated finger flexion and extension to manipulate a paper into a ball before smoothing it flat again. Continue with skilled OT POC and NMRE to assist patient in regaining full functional independence.      Therapy Documentation Precautions:  Precautions Precautions: Fall Precaution Comments: BP <180/105, L-hemi Restrictions Weight Bearing Restrictions: No    Pain:4/10 reports mostly fatigue vs acute pain in LUE     Therapy/Group: Individual Therapy  Warnell Forester 06/20/2023, 11:51 AM

## 2023-06-20 NOTE — Progress Notes (Signed)
Occupational Therapy Session Note  Patient Details  Name: Rebekah Mendez MRN: [id number] Date of Birth: 1961-06-22  Today's Date: 06/20/2023 OT Individual Time: 0955-1050 OT Individual Time Calculation (min): 55 min    Short Term Goals: Week 1:  OT Short Term Goal 1 (Week 1): STGs=LTGs due to patient's estimated length of stay.  Skilled Therapeutic Interventions/Progress Updates:  Skilled OT intervention completed with focus on ADL retraining, functional endurance, and mobility within a shower context. Pt received seated in recliner, agreeable to session. No pain reported. Pt requesting to shower.  Pt completed all sit > stands and ambulatory transfers > 150 ft without AD and CGA/supervision. Min cues needed for body positioning and fall prevention.  Ambulated > toilet, with supervision for all toileting steps continent urinary void. Ambulated to shower and stepped in with use of grab bar to shower chair.  Pt was able to bathe all parts with intermittent supervision, at the sit > stand level while using grab bar for balance. CGA ambulatory transfer > EOB. Able to donn shirt/deo independently. Threaded LB clothing with supervision at the sit > stand level. Donned socks/shoes with set up A. Ambulated to > sink for hair grooming with pt able to maintain stance without UE support for several mins.  Ambulated > gym about 150 ft. Pt participated in the following dynamic standing balance and endurance tasks to promote independence and safety during BADLs and functional mobility: -placing and retrieving squigz from long mirror using LUE for NMR. Supervision sit > stand and supervision needed for balance; seated rest break needed for fatigue  Ambulated back to room another 150 ft > toilet in room. Supervision for urinary void toileting steps. Ambulated to EOB. Pt remained EOB with daughter present, with bed alarm on/activated, and with all needs in reach at end of session.   Therapy  Documentation Precautions:  Precautions Precautions: Fall Precaution Comments: BP <180/105, L-hemi Restrictions Weight Bearing Restrictions: No    Therapy/Group: Individual Therapy  Melvyn Novas, MS, OTR/L  06/20/2023, 11:17 AM

## 2023-06-21 DIAGNOSIS — I69391 Dysphagia following cerebral infarction: Secondary | ICD-10-CM

## 2023-06-21 DIAGNOSIS — R471 Dysarthria and anarthria: Secondary | ICD-10-CM

## 2023-06-21 NOTE — Progress Notes (Signed)
PROGRESS NOTE   Subjective/Complaints:  Post nasal drip, otherwise feels ok, visiting with daughter and friend  ROS: Patient denies CP, SOB, N/V/D    Objective:   No results found. No results for input(s): "WBC", "HGB", "HCT", "PLT" in the last 72 hours.  No results for input(s): "NA", "K", "CL", "CO2", "GLUCOSE", "BUN", "CREATININE", "CALCIUM" in the last 72 hours.   Intake/Output Summary (Last 24 hours) at 06/21/2023 1022 Last data filed at 06/21/2023 0818 Gross per 24 hour  Intake 1196 ml  Output --  Net 1196 ml        Physical Exam: Vital Signs Blood pressure 122/67, pulse 72, temperature 97.8 F (36.6 C), temperature source Oral, resp. rate 18, height 5' 4.5" (1.638 m), weight 101.1 kg, SpO2 96%.  Physical Exam Vitals and nursing note reviewed. Exam conducted with a chaperone present.   General: No acute distress Mood and affect are appropriate Heart: Regular rate and rhythm no rubs murmurs or extra sounds Lungs: Clear to auscultation, breathing unlabored, no rales or wheezes Abdomen: Positive bowel sounds, soft nontender to palpation, nondistended Extremities: No clubbing, cyanosis, or edema Skin: No evidence of breakdown, no evidence of rash  Skin:  Knot in L AC fossa with area of swelling from infiltration of IV-no significant warmth or redness noted,  Neuro: Alert and awake,  follows simple commands,  L Facial weakness when smiling Fair insight and awareness. Dysarthria amild  L side intact LT sensation in UE and LE   Musculoskeletal:     Cervical back: Neck supple. No tenderness.     Comments: RUE 5/5 LUE biceps 4/5; Triceps 4/5; WE 4-/5; grip 4-/5 and FA 2-/5 RLE- 5/5 LLE- proximally 4/5 and distally 4+/5     Assessment/Plan: 1. Functional deficits which require 3+ hours per day of interdisciplinary therapy in a comprehensive inpatient rehab setting. Physiatrist is providing close team  supervision and 24 hour management of active medical problems listed below. Physiatrist and rehab team continue to assess barriers to discharge/monitor patient progress toward functional and medical goals  Care Tool:  Bathing    Body parts bathed by patient: Left arm, Chest, Abdomen, Front perineal area, Buttocks, Right upper leg, Left upper leg, Left lower leg, Face, Right lower leg, Right arm   Body parts bathed by helper: Right arm     Bathing assist Assist Level: Supervision/Verbal cueing     Upper Body Dressing/Undressing Upper body dressing   What is the patient wearing?: Pull over shirt    Upper body assist Assist Level: Independent    Lower Body Dressing/Undressing Lower body dressing      What is the patient wearing?: Underwear/pull up, Pants     Lower body assist Assist for lower body dressing: Supervision/Verbal cueing     Toileting Toileting Toileting Activity did not occur (Clothing management and hygiene only): N/A (no void or bm)  Toileting assist Assist for toileting: Supervision/Verbal cueing     Transfers Chair/bed transfer  Transfers assist     Chair/bed transfer assist level: Contact Guard/Touching assist     Locomotion Ambulation   Ambulation assist      Assist level: Contact Guard/Touching assist Assistive device: No  Device Max distance: >163ft   Walk 10 feet activity   Assist     Assist level: Contact Guard/Touching assist Assistive device: No Device   Walk 50 feet activity   Assist    Assist level: Contact Guard/Touching assist Assistive device: No Device    Walk 150 feet activity   Assist Walk 150 feet activity did not occur: Safety/medical concerns  Assist level: Contact Guard/Touching assist Assistive device: No Device    Walk 10 feet on uneven surface  activity   Assist Walk 10 feet on uneven surfaces activity did not occur: Safety/medical concerns         Wheelchair     Assist Is the  patient using a wheelchair?: Yes Type of Wheelchair: Manual    Wheelchair assist level: Minimal Assistance - Patient > 75% Max wheelchair distance: 100    Wheelchair 50 feet with 2 turns activity    Assist        Assist Level: Minimal Assistance - Patient > 75%   Wheelchair 150 feet activity     Assist      Assist Level: Moderate Assistance - Patient 50 - 74%   Blood pressure 122/67, pulse 72, temperature 97.8 F (36.6 C), temperature source Oral, resp. rate 18, height 5' 4.5" (1.638 m), weight 101.1 kg, SpO2 96%.   Medical Problem List and Plan: 1. 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             -patient may  shower             -ELOS/Goals: 7-10 days supervision  -Continue CIR   -Expected discharge 11/15  Resting hand splint ordered- reports she is using this at night    2.  Antithrombotics: -DVT/anticoagulation:  Pharmaceutical: Lovenox             -antiplatelet therapy: Aspirin 81 mg daily and Plavix 75 mg daily x 3 weeks then aspirin alone (start date 11/03)   3. Pain Management: Tylenol as needed, Robaxin as needed muscle spasms   4. Mood/Behavior/Sleep: Melatonin 3 mg nightly, Xanax 0.25 mg twice daily as needed- not home medicine that I could tell- wasn't on meds at home at all.              -antipsychotic agents: N/A   5. Neuropsych/cognition: This patient is capable of making decisions on her own behalf.   6. Skin/Wound Care: Routine skin checks  -Monitor swelling site of prior IV infiltration. No signs of infection noted today. Continue warm compress . Appears to be gradually improving   7. Fluids/Electrolytes/Nutrition: Routine in and outs with follow-up chemistries   8.  Hypertension.  Norvasc 10 mg daily.  Monitor with increased mobility    06/21/2023    5:06 AM 06/20/2023    7:31 PM 06/20/2023    1:16 PM  Vitals with BMI  Systolic 122 139 161  Diastolic 67 70 89  Pulse 72 71  77    11/8 start lisinopril 2.5mg  daily- monitor effect   9.  Hyperlipidemia.  Lipitor   10.  Morbid Obesity.  BMI 38.60.  Dietary follow-up   11.  Tobacco use.  NicoDerm patch- discontinued.  Provide counseling   12.  Constipation.  MiraLAX daily, Senokot S twice daily.  -Will change mirlax to PRN per pt request,  -LBM 11/7- Continue to monitor    13. Needs PCP- hasn't seen physician in 7= years.   16. Prediabetes- educated  on diagnosis and that needs to eat carbs in moderation.   -CBG stable 104  17. Dysphagia- improving   -Diet adjusted to D3 thin, will consult SLP  -Continue PPI  18.  Dysarthria post stroke   LOS: 4 days A FACE TO FACE EVALUATION WAS PERFORMED  Erick Colace 06/21/2023, 10:22 AM

## 2023-06-21 NOTE — Evaluation (Signed)
Speech Language Pathology Bedside Swallow Evaluation   Patient Details  Name: Rebekah Mendez MRN: [id number] Date of Birth: [birth year]  SLP Diagnosis:N/A Rehab Potential: N/A ELOS: N/A    Today's Date: 06/21/2023 SLP Individual Time: 4098-1191 SLP Individual Time Calculation (min): 20 min   Hospital Problem: Principal Problem:   Infarction of right basal ganglia (HCC)  Past Medical History:  Past Medical History:  Diagnosis Date   OSA (obstructive sleep apnea)    Thyroid activity decreased    late 20's   Past Surgical History:  Past Surgical History:  Procedure Laterality Date   CESAREAN SECTION     Feb '89   EXCISIONAL HEMORRHOIDECTOMY      Assessment / Plan / Recommendation Clinical Impression Patient is a 62 year old female with no significant medical hx.  Pt presented to [facility] on 06/13/23 d/t sudden onset of left-sided numbness and weakness. 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  Small remote hemorrhagic lacunar infarct at the right lentiform nucleus.  Therapy evaluations completed and patient admitted to [facility] 06/16/24. SLP consulted today due to patient reports of difficulty swallowing.  Patient reports that her swallowing difficulty appeared ~2 days ago and that she has only had difficulty on 2 occasions (once with chicken and once with a pill). Patient reports she felt that the bolus "got hung up" but eventually cleared. SLP provided education regarding the importance of an upright posture during PO intake as patient was partially reclined in bed upon arrival while eating her breakfast. Education was also provided regarding general esophageal precautions.   Patient also reports post-nasal drip and an increase in phlegm which she felt contributed to her "throat feeling full." Patient reports that she feels this has improved but requested a decongestant. Physician made aware.    Patient observed with breakfast meal of Dys. 3 textures with thin liquids via straw. Patient demonstrated efficient mastication and complete oral clearance without overt s/s of aspiration across any consistency. Recommend patient continue current diet.  Suspect patient is at her baseline level of swallowing function, therefore, skilled SLP intervention is not warranted at this time. Patient verbalized understanding and agreement of all recommendations and educated on re-consulting SLP if swallowing issues arise in the future.     Skilled Therapeutic Interventions          Administered a BSE, please see above for details.   SLP Assessment  Patient does not need any further Speech Lanaguage Pathology Services    Recommendations  SLP Diet Recommendations: Thin;Age appropriate regular solids Liquid Administration via: Straw;Cup Medication Administration: Whole meds with liquid Supervision: Patient able to self feed Compensations: Minimize environmental distractions;Slow rate;Small sips/bites Postural Changes and/or Swallow Maneuvers: Seated upright 90 degrees;Upright 30-60 min after meal Oral Care Recommendations: Oral care BID Patient destination: Home Follow up Recommendations: None Equipment Recommended: None recommended by SLP    SLP Frequency N/A  SLP Duration  SLP Intensity  SLP Treatment/Interventions N/A  N/A  N/A   Pain No/Denies Pain    Care Tool Care Tool Cognition Ability to hear (with hearing aid or hearing appliances if normally used Ability to hear (with hearing aid or hearing appliances if normally used): 0. Adequate - no difficulty in normal conservation, social interaction, listening to TV   Expression of Ideas and Wants Expression of Ideas and Wants: 4. Without difficulty (complex and basic) - expresses complex messages without difficulty and with speech that is clear and  easy to understand   Understanding Verbal and Non-Verbal Content Understanding Verbal  and Non-Verbal Content: 4. Understands (complex and basic) - clear comprehension without cues or repetitions  Memory/Recall Ability Memory/Recall Ability : Current season;Location of own room;Staff names and faces;That he or she is in a hospital/hospital unit   Bedside Swallowing Assessment General Date of Onset: 06/21/23 Previous Swallow Assessment: N/A Diet Prior to this Study: Regular;Thin liquids (Level 0) Temperature Spikes Noted: No Respiratory Status: Room air History of Recent Intubation: No Behavior/Cognition: Alert;Cooperative;Pleasant mood Oral Cavity - Dentition: Adequate natural dentition Self-Feeding Abilities: Able to feed self Patient Positioning: Upright in bed Baseline Vocal Quality: Normal Volitional Cough: Strong Volitional Swallow: Able to elicit  Ice Chips Ice chips: Not tested Thin Liquid Thin Liquid: Within functional limits Presentation: Self Fed;Cup;Straw Nectar Thick Nectar Thick Liquid: Not tested Honey Thick Honey Thick Liquid: Not tested Puree Puree: Not tested Solid Solid: Within functional limits Presentation: Self Fed BSE Assessment Risk for Aspiration Impact on safety and function: No limitations Other Related Risk Factors: History of GERD  Short Term Goals: N/A  Refer to Care Plan for Long Term Goals  Recommendations for other services: None   Discharge Criteria: Patient will be discharged from SLP if patient refuses treatment 3 consecutive times without medical reason, if treatment goals not met, if there is a change in medical status, if patient makes no progress towards goals or if patient is discharged from hospital.  The above assessment, treatment plan, treatment alternatives and goals were discussed and mutually agreed upon: by patient  Kevan Prouty 06/21/2023, 8:11 AM

## 2023-06-21 NOTE — Progress Notes (Signed)
Discussed secondary stroke prevention with patient including monitoring BP at home and periodic reevaluation of medication efficacy, PCP if possible, Reducing/quitting occasional cigarette use.

## 2023-06-22 MED ORDER — LORATADINE 10 MG PO TABS: 10.0000 mg | ORAL_TABLET | Freq: Every day | ORAL | Status: DC | PRN

## 2023-06-22 NOTE — Progress Notes (Signed)
Physical Therapy Session Note  Patient Details  Name: Rebekah Mendez MRN: [id number] Date of Birth: [birth year]  Today's Date: 06/22/2023 PT Individual Time: 4098-1191, 4782-9562 PT Individual Time Calculation (min): 74 min, 48 min   Short Term Goals: Week 1:  PT Short Term Goal 1 (Week 1): Pt will ambulate 150 feet with LRAD and CGA or less PT Short Term Goal 2 (Week 1): Pt will ascend/descend 2 6 inch steps with no handrails and LRAD with CGA or less PT Short Term Goal 3 (Week 1): Pt will performed stand pivot transfer with LRAD and supervision  Skilled Therapeutic Interventions/Progress Updates:     Treatment Session 1  Pt supine in bed upon arrival with husband in room. Pt requesting for son to be checked off in room.   Therapist provided demonstration of gait with no AD and CGA from room to bathroom. Pt husband returned demonstration. Pt husband checked off on transfers in room.    Patient demonstrates increased fall risk as noted by score of  43/56 on Berg Balance Scale.  (<36= high risk for falls, close to 100%; 37-45 significant >80%; 46-51 moderate >50%; 52-55 lower >25%).   Pt performed tandem walking on green line as visual cue, x35 feet with L HHA, verbal cues provided for heel toe pattern.   Pt performed toe taps on 6 inch steps with no AD and CGA 1x10 B with verbal cues provided for light taps, 1x10 B while hovering LE above step for 3 seconds with intermittent min A for lateral LOB to L.   Pt ambulated room to main gym and main gym to room with no AD and close supervision, verbal cues provided for B UE reciprocal arm swing.   Pt ascended/descended 2x8 6 inch steps with R UE support on handrail on first 4 steps with reciprocal gait, and L UE support on handrail for reciprocal gait for ascending and step to gait for descending 2/2 FOF, with CGA/min A. On 2nd trial, pt performed with R UE x4 with reciprocal gait and supervision, and with L UE x4 with CGA with reciprocal  gait, verbal cues provided for techniuqe.   Pt ascended 16 3 inch steps with no UE support and reciprocal gait with CGA/min A intermittent LOB on first 8 steps, progressing to CGA on remaining 8 steps with repetition.   Discussed recommendation for stair navigation at home, education provided for step to gait, ascending leading with R LE, and descending leading with L LE, pt performed with no UE support and supervision, verbal cues provided for L LE clearance with ascending.   Pt reports L LE heaviness and fatigue throughout session. Discussed rollator, especially for community navigation as it would allow pt to take seated rest break as needed with fatigue. Pt trialed rollator, pt demos improved gait speed and heel toe pattern/knee flexion. Pt reports goal of being able to walk without an AD. Plan to continue to progress gait, static and dynamic standing balance.   Pt seated in recliner with needs within reach and pt husband in room at end of session.   Treatment Session 2    Pt supine in bed upon arrival. Pt agreeable to therapy. Pt denies any pain.   6 Min Walk Test:  Instructed patient to ambulate as quickly and as safely as possible for 6 minutes using LRAD. Patient was allowed to take standing rest breaks without stopping the test, but if the patient required a sitting rest break the clock would be stopped and  the test would be over.  Results: 476 feet using no AD with supervision. Results indicate that the patient has reduced endurance with ambulation compared to age matched norms.  Age Matched Norms: 79-69 yo M: 90 F: 61, 56-79 yo M: 55 F: 471, 67-89 yo M: 417 F: 392 MDC: 58.21 meters (190.98 feet) or 50 meters (ANPTA Core Set of Outcome Measures for Adults with Neurologic Conditions, 2018)   Pt ambulated from room to [facility] without AD 2/2 fatigue/"catch" in L LE, pt able to continue ambulation with rollator with supervision, verbal cues provided for technique. Pt  reports relief with use of rollator.   Pt ambulated throughout gift shop with no AD and supervision, pt demos no LOB with navigating narrow spaces and turns, verbal cues provided for reciprocal arm swing.   Pt ambulated with rollator from gift shop to room with rollator, with supervision. Pt performed ambulatory rest break on rollator with supervision, verbal cues provided for technique.   Discussed pt needing an AD for community navigation. Plan to trail cane next session.   Pt supine in bed with all needs within reach and family in room at end of session.        Therapy Documentation Precautions:  Precautions Precautions: Fall Precaution Comments: BP <180/105, L-hemi Restrictions Weight Bearing Restrictions: No    Therapy/Group: Individual Therapy  [facility] Ambrose [place], [place], DPT  06/22/2023, 7:26 AM

## 2023-06-22 NOTE — Progress Notes (Signed)
PROGRESS NOTE   Subjective/Complaints:  No issues overnite  ROS: Patient denies CP, SOB, N/V/D    Objective:   No results found. No results for input(s): "WBC", "HGB", "HCT", "PLT" in the last 72 hours.  No results for input(s): "NA", "K", "CL", "CO2", "GLUCOSE", "BUN", "CREATININE", "CALCIUM" in the last 72 hours.   Intake/Output Summary (Last 24 hours) at 06/22/2023 1138 Last data filed at 06/22/2023 0720 Gross per 24 hour  Intake 480 ml  Output --  Net 480 ml        Physical Exam: Vital Signs Blood pressure (!) 117/49, pulse 67, temperature 97.8 F (36.6 C), temperature source Oral, resp. rate 18, height 5' 4.5" (1.638 m), weight 101.1 kg, SpO2 98%.  Physical Exam Vitals and nursing note reviewed.   General: No acute distress Mood and affect are appropriate Heart: Regular rate and rhythm no rubs murmurs or extra sounds Lungs: Clear to auscultation, breathing unlabored, no rales or wheezes Abdomen: Positive bowel sounds, soft nontender to palpation, nondistended Extremities: No clubbing, cyanosis, or edema Skin: No evidence of breakdown, no evidence of rash  Neuro: Alert and awake,  follows simple commands,  L Facial weakness when smiling  Dysarthria mild  L side intact LT sensation in UE and LE   Musculoskeletal:     Cervical back: Neck supple. No tenderness.     Comments: RUE 5/5 LUE biceps 4/5; Triceps 4/5; WE 4-/5; grip 4-/5 and FA 3-/5 RLE- 5/5 LLE- proximally 4/5 and distally 4+/5 Fine motor - intact finger to thumb opposition , slower on Left than on Right     Assessment/Plan: 1. Functional deficits which require 3+ hours per day of interdisciplinary therapy in a comprehensive inpatient rehab setting. Physiatrist is providing close team supervision and 24 hour management of active medical problems listed below. Physiatrist and rehab team continue to assess barriers to discharge/monitor  patient progress toward functional and medical goals  Care Tool:  Bathing    Body parts bathed by patient: Left arm, Chest, Abdomen, Front perineal area, Buttocks, Right upper leg, Left upper leg, Left lower leg, Face, Right lower leg, Right arm   Body parts bathed by helper: Right arm     Bathing assist Assist Level: Supervision/Verbal cueing     Upper Body Dressing/Undressing Upper body dressing   What is the patient wearing?: Pull over shirt    Upper body assist Assist Level: Independent    Lower Body Dressing/Undressing Lower body dressing      What is the patient wearing?: Underwear/pull up, Pants     Lower body assist Assist for lower body dressing: Supervision/Verbal cueing     Toileting Toileting Toileting Activity did not occur (Clothing management and hygiene only): N/A (no void or bm)  Toileting assist Assist for toileting: Supervision/Verbal cueing     Transfers Chair/bed transfer  Transfers assist     Chair/bed transfer assist level: Contact Guard/Touching assist     Locomotion Ambulation   Ambulation assist      Assist level: Contact Guard/Touching assist Assistive device: No Device Max distance: >139ft   Walk 10 feet activity   Assist     Assist level: Contact Guard/Touching assist  Assistive device: No Device   Walk 50 feet activity   Assist    Assist level: Contact Guard/Touching assist Assistive device: No Device    Walk 150 feet activity   Assist Walk 150 feet activity did not occur: Safety/medical concerns  Assist level: Contact Guard/Touching assist Assistive device: No Device    Walk 10 feet on uneven surface  activity   Assist Walk 10 feet on uneven surfaces activity did not occur: Safety/medical concerns         Wheelchair     Assist Is the patient using a wheelchair?: Yes Type of Wheelchair: Manual    Wheelchair assist level: Minimal Assistance - Patient > 75% Max wheelchair distance: 100     Wheelchair 50 feet with 2 turns activity    Assist        Assist Level: Minimal Assistance - Patient > 75%   Wheelchair 150 feet activity     Assist      Assist Level: Moderate Assistance - Patient 50 - 74%   Blood pressure (!) 117/49, pulse 67, temperature 97.8 F (36.6 C), temperature source Oral, resp. rate 18, height 5' 4.5" (1.638 m), weight 101.1 kg, SpO2 98%.   Medical Problem List and Plan: 1. 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             -patient may  shower             -ELOS/Goals: 7-10 days supervision  -Continue CIR   -Expected discharge 11/15  Resting hand splint ordered- reports she is using this at night    2.  Antithrombotics: -DVT/anticoagulation:  Pharmaceutical: Lovenox             -antiplatelet therapy: Aspirin 81 mg daily and Plavix 75 mg daily x 3 weeks then aspirin alone (start date 11/03)   3. Pain Management: Tylenol as needed, Robaxin as needed muscle spasms   4. Mood/Behavior/Sleep: Melatonin 3 mg nightly, Xanax 0.25 mg twice daily as needed- not home medicine that I could tell- wasn't on meds at home at all.              -antipsychotic agents: N/A   5. Neuropsych/cognition: This patient is capable of making decisions on her own behalf.   6. Skin/Wound Care: Routine skin checks  -Monitor swelling site of prior IV infiltration. No signs of infection noted today. Continue warm compress . Appears to be gradually improving   7. Fluids/Electrolytes/Nutrition: Routine in and outs with follow-up chemistries   8.  Hypertension.  Norvasc 10 mg daily.  Monitor with increased mobility    06/22/2023    3:56 AM 06/21/2023    5:45 PM 06/21/2023    1:35 PM  Vitals with BMI  Systolic 117 137 629  Diastolic 49 60 64  Pulse 67 74 72    11/8 start lisinopril 2.5mg  daily- monitor effect- BP in good range 11/10   9.  Hyperlipidemia.  Lipitor   10.  Morbid Obesity.  BMI 38.60.   Dietary follow-up   11.  Tobacco use.  NicoDerm patch- discontinued.  Provide counseling   12.  Constipation.  MiraLAX daily, Senokot S twice daily.  -Will change mirlax to PRN per pt request,  -LBM 11/7- Continue to monitor    13. Needs PCP- hasn't seen physician in 7= years.   16. Prediabetes- educated on diagnosis and that needs to eat carbs in moderation.   -CBG stable 104  17. Dysphagia- improving   -Diet adjusted to D3 thin, will consult SLP  -Continue PPI  18.  Dysarthria post stroke- mild cont SLP   LOS: 5 days A FACE TO FACE EVALUATION WAS PERFORMED  Rebekah Mendez 06/22/2023, 11:38 AM

## 2023-06-22 NOTE — Plan of Care (Signed)
  Problem: RH BOWEL ELIMINATION Goal: RH STG MANAGE BOWEL WITH ASSISTANCE Description: STG Manage Bowel with toileting Assistance. Flowsheets (Taken 06/22/2023 2248) STG: Pt will manage bowels with assistance: 6-Modified independent Goal: RH STG MANAGE BOWEL W/MEDICATION W/ASSISTANCE Description: STG Manage Bowel with Medication with mod Assistance. Flowsheets (Taken 06/22/2023 2249) STG: Pt will manage bowels with medication with assistance: 6-Modified independent   Problem: RH BLADDER ELIMINATION Goal: RH STG MANAGE BLADDER WITH ASSISTANCE Description: STG Manage Bladder With toileting Assistance Flowsheets (Taken 06/22/2023 2249) STG: Pt will manage bladder with assistance: 6-Modified independent   Problem: RH SAFETY Goal: RH STG ADHERE TO SAFETY PRECAUTIONS W/ASSISTANCE/DEVICE Description: STG Adhere to Safety Precautions With cues Assistance/Device. Flowsheets (Taken 06/22/2023 2249) STG:Pt will adhere to safety precautions with assistance/device: 7-Independent

## 2023-06-22 NOTE — Progress Notes (Signed)
Occupational Therapy Session Note  Patient Details  Name: Rebekah Mendez MRN: [id number] Date of Birth: 12-31-1960  Today's Date: 06/22/2023 OT Individual Time: 8295-6213 OT Individual Time Calculation (min): 47 min   Today's Date: 06/22/2023 OT Individual Time: 0865-7846 OT Individual Time Calculation (min): 40 min   Short Term Goals: Week 1:  OT Short Term Goal 1 (Week 1): STGs=LTGs due to patient's estimated length of stay.  Skilled Therapeutic Interventions/Progress Updates:   Session 1: Pt greeted sitting in recliner for skilled OT session with focus on BADL participation per patient request due to visitors.   Pain: Pt with un-rated soreness in L-shoulder due manual therapy session. OT offering intermediate rest breaks and positioning suggestions throughout session to address pain/fatigue and maximize participation/safety in session.   Functional Transfers: Pt performs all STS, toilet, and walk-in shower transfers with close supervision and intermediate use of grab bars in bathroom. Pt ambulates greater than household-level distance from room<>main therapy gym with close supervision + no AD.   Self Care Tasks: Pt bathes full-body with supervision, progressing to Mod I, utilizing LUE as gross assist, lateral leans for pericare, standing with use of grab bar to wash off all the soap.   Therapeutic Activities: In main therapy gym, pt simulates IADL task of carrying items while walking, using tidal wave. Pt ambulates house-hold level distance with close supervision, requiring cuing for tidal wave equilibrium, greater difficulty with above task while conversating with therapist.   Pt remained sitting in recliner with 4Ps assessed and immediate needs met. Pt continues to be appropriate for skilled OT intervention to promote further functional independence in ADLs/IADLs.   Session 2: Pt greeted resting in bed for skilled OT session with focus on LUE NMR.   Pain: Pt with no reports of  pain this session. OT offering intermediate rest breaks and positioning suggestions throughout session to address potential pain/fatigue and maximize participation/safety in session.   Functional Transfers: Pt ambulates to all therapy locations with close supervision + no AD.   Therapeutic Activities: In SLP room, seated at desktop, pt completes x2 cycles of typing practice for L-hand NMR and simulation of work-related tasks. Pt requires cuing to avoid over compensation with L-shoulder for increased reach.   9 hole-peg test used as modaility, as patient completes x2 reps of placing/retrieving pegs. Pt requires increased time to complete, encouragement provided as patient experiencing increased frustration with activity. Pt with minimal dropping, observed to be using 2 digit instead of 1st for pincer grip throughout activity.  Education: Pt educated on tan theraputty pincer grip exercise and retreival of beads for carry over into day time outside of therapy.   Pt remained resting in bed with 4Ps assessed and immediate needs met. Pt continues to be appropriate for skilled OT intervention to promote further functional independence in ADLs/IADLs.   Therapy Documentation Precautions:  Precautions Precautions: Fall Precaution Comments: BP <180/105, L-hemi Restrictions Weight Bearing Restrictions: No   Therapy/Group: Individual Therapy  Lou Cal, OTR/L, MSOT  06/22/2023, 5:52 AM

## 2023-06-23 ENCOUNTER — Inpatient Hospital Stay (HOSPITAL_COMMUNITY): Payer: Self-pay

## 2023-06-23 MED ORDER — FLUTICASONE PROPIONATE 50 MCG/ACT NA SUSP
2.0000 | Freq: Every day | NASAL | Status: DC
  Administered 2023-06-23 – 2023-06-26 (×4): 2 via NASAL
  Filled 2023-06-23: qty 16

## 2023-06-23 NOTE — Progress Notes (Signed)
Uneventful night. Wearing left WHO at HS. Continent of B & B. LBM 11/10. Denies pain. Rebekah Mendez A

## 2023-06-23 NOTE — Progress Notes (Signed)
PROGRESS NOTE   Subjective/Complaints:  No acute events noted overnight.  She is very anxious this morning because she has noticed some tingling on her upper left face.  Swallowing is doing better.   ROS: Patient denies fever, chills CP, SOB, N/V/D, no new changes in strength     Objective:   No results found. No results for input(s): "WBC", "HGB", "HCT", "PLT" in the last 72 hours.  No results for input(s): "NA", "K", "CL", "CO2", "GLUCOSE", "BUN", "CREATININE", "CALCIUM" in the last 72 hours.   Intake/Output Summary (Last 24 hours) at 06/23/2023 0829 Last data filed at 06/23/2023 0717 Gross per 24 hour  Intake 840 ml  Output --  Net 840 ml        Physical Exam: Vital Signs Blood pressure (!) 152/73, pulse 66, temperature (!) 97.5 F (36.4 C), temperature source Oral, resp. rate 18, height 5' 4.5" (1.638 m), weight 101.1 kg, SpO2 98%.  Physical Exam Vitals and nursing note reviewed.   General: No acute distress Patient is  anxious this morning Heart: Regular rate and rhythm no rubs murmurs or extra sounds Lungs: Clear to auscultation, breathing unlabored, no rales or wheezes Abdomen: Positive bowel sounds, soft nontender to palpation, nondistended Extremities: No clubbing, cyanosis, or edema Skin: No evidence of breakdown, no evidence of rash  Neuro: Alert and awake,  follows simple commands,  Slight L Facial weakness when smiling  Dysarthria mild  L side intact LT sensation in UE and LE  Facial sensation intact bilateral face-although she reports it is a little altered in her upper face  Musculoskeletal:     Cervical back: Neck supple. No tenderness.     Comments: RUE 5/5 LUE biceps 4/5; Triceps 4/5; WE 4-/5; grip 4-/5 and FA 3-/5 RLE- 5/5 LLE- proximally 4/5 and distally 4+/5 Fine motor - intact finger to thumb opposition , slower on Left than on Right  Exam stable  11/11   Assessment/Plan: 1. Functional deficits which require 3+ hours per day of interdisciplinary therapy in a comprehensive inpatient rehab setting. Physiatrist is providing close team supervision and 24 hour management of active medical problems listed below. Physiatrist and rehab team continue to assess barriers to discharge/monitor patient progress toward functional and medical goals  Care Tool:  Bathing    Body parts bathed by patient: Left arm, Chest, Abdomen, Front perineal area, Buttocks, Right upper leg, Left upper leg, Left lower leg, Face, Right lower leg, Right arm   Body parts bathed by helper: Right arm     Bathing assist Assist Level: Supervision/Verbal cueing     Upper Body Dressing/Undressing Upper body dressing   What is the patient wearing?: Pull over shirt    Upper body assist Assist Level: Independent    Lower Body Dressing/Undressing Lower body dressing      What is the patient wearing?: Underwear/pull up, Pants     Lower body assist Assist for lower body dressing: Supervision/Verbal cueing     Toileting Toileting Toileting Activity did not occur (Clothing management and hygiene only): N/A (no void or bm)  Toileting assist Assist for toileting: Supervision/Verbal cueing     Transfers Chair/bed transfer  Transfers assist  Chair/bed transfer assist level: Contact Guard/Touching assist     Locomotion Ambulation   Ambulation assist      Assist level: Contact Guard/Touching assist Assistive device: No Device Max distance: >134ft   Walk 10 feet activity   Assist     Assist level: Contact Guard/Touching assist Assistive device: No Device   Walk 50 feet activity   Assist    Assist level: Contact Guard/Touching assist Assistive device: No Device    Walk 150 feet activity   Assist Walk 150 feet activity did not occur: Safety/medical concerns  Assist level: Contact Guard/Touching assist Assistive device: No  Device    Walk 10 feet on uneven surface  activity   Assist Walk 10 feet on uneven surfaces activity did not occur: Safety/medical concerns         Wheelchair     Assist Is the patient using a wheelchair?: Yes Type of Wheelchair: Manual    Wheelchair assist level: Minimal Assistance - Patient > 75% Max wheelchair distance: 100    Wheelchair 50 feet with 2 turns activity    Assist        Assist Level: Minimal Assistance - Patient > 75%   Wheelchair 150 feet activity     Assist      Assist Level: Moderate Assistance - Patient 50 - 74%   Blood pressure (!) 152/73, pulse 66, temperature (!) 97.5 F (36.4 C), temperature source Oral, resp. rate 18, height 5' 4.5" (1.638 m), weight 101.1 kg, SpO2 98%.   Medical Problem List and Plan: 1. 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             -patient may  shower             -ELOS/Goals: 7-10 days supervision  -Continue CIR   -Expected discharge 11/15  Resting hand splint ordered- reports she is using this at night  -CT head was ordered this morning due to left facial tingling and left orbital pressure    2.  Antithrombotics: -DVT/anticoagulation:  Pharmaceutical: Lovenox             -antiplatelet therapy: Aspirin 81 mg daily and Plavix 75 mg daily x 3 weeks then aspirin alone (start date 11/03)   3. Pain Management: Tylenol as needed, Robaxin as needed muscle spasms   4. Mood/Behavior/Sleep: Melatonin 3 mg nightly, Xanax 0.25 mg twice daily as needed- not home medicine that I could tell- wasn't on meds at home at all.              -antipsychotic agents: N/A   5. Neuropsych/cognition: This patient is capable of making decisions on her own behalf.   6. Skin/Wound Care: Routine skin checks  -Monitor swelling site of prior IV infiltration. No signs of infection noted today. Continue warm compress . Appears to be gradually improving   7.  Fluids/Electrolytes/Nutrition: Routine in and outs with follow-up chemistries   8.  Hypertension.  Norvasc 10 mg daily.  Monitor with increased mobility    06/23/2023    7:50 AM 06/23/2023    4:27 AM 06/22/2023    8:05 PM  Vitals with BMI  Systolic 152 114 045  Diastolic 73 55 57  Pulse  66 72    11/8 start lisinopril 2.5mg  daily- monitor effect- BP in good range 11/10  11/11 BP controlled overall continue current regimen   9.  Hyperlipidemia.  Lipitor   10.  Morbid Obesity.  BMI  38.60.  Dietary follow-up   11.  Tobacco use.  NicoDerm patch- discontinued.  Provide counseling   12.  Constipation.  MiraLAX daily, Senokot S twice daily.  -Will change mirlax to PRN per pt request,  -LBM 11/1 LBM yesterday continue to monitor   13. Needs PCP- hasn't seen physician in 7= years.   16. Prediabetes- educated on diagnosis and that needs to eat carbs in moderation.   -CBG stable 104  17. Dysphagia- improving   -Diet adjusted to D3 thin, will consult SLP  -Continue PPI  -Patient reported she is having some postnasal drip and congestion that might be contributing.  Will start Flonase   18.  Dysarthria post stroke- mild cont SLP   LOS: 6 days A FACE TO FACE EVALUATION WAS PERFORMED  Fanny Dance 06/23/2023, 8:29 AM

## 2023-06-23 NOTE — Progress Notes (Signed)
Occupational Therapy Session Note  Patient Details  Name: Rebekah Mendez MRN: [id number] Date of Birth: Dec 27, 1960  Today's Date: 06/23/2023 OT Missed Time: 75 Minutes Missed Time Reason: CT/MRI  Today's Date: 06/23/2023 OT Individual Time: 1400-1430 OT Individual Time Calculation (min): 30 min   Short Term Goals: Week 1:  OT Short Term Goal 1 (Week 1): STGs=LTGs due to patient's estimated length of stay.  Skilled Therapeutic Interventions/Progress Updates:   Session 1: Pt missing ~75 minutes of skilled intervention due to STAT CT.   Session 2: Pt greeted sitting at EOB for skilled OT session with focus on dynamic standing balance and LUE/LLE NMR.   Pain: Pt with no reports of pain, although reporting increased fatigue. OT offering intermediate rest breaks and positioning suggestions throughout session to address pain/fatigue and maximize participation/safety in session.   Functional Transfers: Pt performs all STS transfers with close supervision + no AD, ambulating from room<> day room with same level of assistance.   Therapeutic Activities: In day room, pt instructed in dynamic standing activity with emphasis placed on LUE/LLE NMR, as patient was tasked with stepping onto airex pad to place yellow resistive clip on elevated surface. Pt with increased difficulty stepping onto/of of pad due to decreased elevation of LLE, CGA-Min A overall, no AD. Pt completes x3 reps of activity.    Education: Pt asking important questions in regards to signs/symptoms of a stroke. BE FAST acronym reviewed with patient, further education deferred to educational binder and MD/PA.   Pt remained resting in bed with 4Ps assessed and immediate needs met. Pt continues to be appropriate for skilled OT intervention to promote further functional independence in ADLs/IADLs.   Therapy Documentation Precautions:  Precautions Precautions: Fall Precaution Comments: BP <180/105, L-hemi Restrictions Weight  Bearing Restrictions: No   Therapy/Group: Individual Therapy  Lou Cal, OTR/L, MSOT  06/23/2023, 6:14 AM

## 2023-06-23 NOTE — Progress Notes (Signed)
Physical Therapy Session Note  Patient Details  Name: Jalexa Greninger MRN: [id number] Date of Birth: 1961/05/23  Today's Date: 06/23/2023 PT Individual Time: 1324-4010, 2725-3664 PT Individual Time Calculation (min): 45 min, 48 min  and Today's Date: 06/23/2023 PT Missed Time: 15 Minutes Missed Time Reason: Nursing care  Short Term Goals: Week 1:  PT Short Term Goal 1 (Week 1): Pt will ambulate 150 feet with LRAD and CGA or less PT Short Term Goal 2 (Week 1): Pt will ascend/descend 2 6 inch steps with no handrails and LRAD with CGA or less PT Short Term Goal 3 (Week 1): Pt will performed stand pivot transfer with LRAD and supervision  Skilled Therapeutic Interventions/Progress Updates:      Pt seated EOB upon arrival. Pt agreeable to therapy. Pt denies any pain.   Pt performed sit to stand with mod I.  Pt ambulated to sink and brushed teeth with mod I.   Trialed quad cane, and SPC, pt ambulated 100 feet with each, verbal cues provided for sequencing, pt demos improved technique with practice. Upon discussion, pt reports prefernce for rollator versus cane as it has seat for option to take rest break as needed. Recommending rollator for community navigation upon discharge home.   Pt ascended/descended 8 inch curb step with L HHA with min-light mod A for lateral sway.   Pt ascended/descended 2x4 6 inch steps with intermittent use of R UE support on stairs and CGA/light min A for lateral sway, verbal cues provided for sequencing for step to gait, and L hip and knee flexion for improved L LE clearance. Pt reports L LE feels heavier today.   Pt performed 2x10 L LE toe taps on 8 inch step with no UE support and CGA, verbal and tactile cues provided for increased L LE toe clearance. Pt demos improved clearance on 2nd trail, but decreases with fatigue.   Pt navigated 10 4 inch hurdles with no UE support and step gait leading with L LE, pt demos improved L LE clerance with each repetition.    Pt requesting water. Upon therapist return with water, pt crying and reports heaviness behind L eye, and numbness and tingling on L side of face. Therapist assessed pt smile, and arms, both equal, no change noted. Therapist assessed UE and LE strength, no change noted. Therapist notified PA, and nurse of pt symptoms. Vitals assessed: BP 135/94   Pt performed stand pivot transfer to [facility] with no AD and CGA for safety, pt transported dependnet in WC to room. Pt performed stand pivot transfer WC to bed with supervision. Pt supine in bed with all needs within reach and family in the room. PA in room at end of session to assess pt.    Treatment Session 2   Pt supine in bed upon arrival. Pt agreeable to therapy. Pt denies any pain.   Discussed pt CT results, pt denies any question/concerns. Pt able to recall BE FAST acronym from OT session. Pt provided teach back of notifying MD if pt notices any new signs and symptoms of stroke.   Pt ambulated with rollator and supervision from room to outside near womens and childrens with supervision/mod I, pt did not require seated rest break until she reached outside. Pt denies any catching feeling in her hip with use of rollator.   Pt ascended/descended ramp with rollator and supervision/mod I.   Pt ascended/descended 8 3 inch steps with no UE support and supervision, verbal cues provided for step to gait, ascending  leading with R LE, descending leading with L LE.   Reviewed and performed HEP, handout provided.   1x10 stand hip extension B with counter top support   1x10 standing hip abduction B with counter top support   1x10 standing marching B with counter top support 1x10 standing grapevine B  with counter top support (education provided on goal of increasing stepping strategy to reduce fall risk)  1x10 single limb stance with counter top support as needed    Pt seated in recliner at end of session with needs within reach and chair alarm on.               Therapy Documentation Precautions:  Precautions Precautions: Fall Precaution Comments: BP <180/105, L-hemi Restrictions Weight Bearing Restrictions: No  Therapy/Group: Individual Therapy  [facility] Ambrose Finland, [place], DPT  06/23/2023, 7:39 AM

## 2023-06-23 NOTE — Progress Notes (Signed)
Patient reporting sudden onset of left facial tingling and left orbital pressure. Neuro exam without acute changes. Will check head CT.

## 2023-06-24 DIAGNOSIS — F54 Psychological and behavioral factors associated with disorders or diseases classified elsewhere: Secondary | ICD-10-CM

## 2023-06-24 LAB — CREATININE, SERUM
Creatinine, Ser: 0.8 mg/dL (ref 0.44–1.00)
GFR, Estimated: >60 mL/min (ref >60)

## 2023-06-24 NOTE — Progress Notes (Signed)
Patient ID: Rebekah Mendez, female   DOB: 11-15-1960, 62 y.o.   MRN: [id number]  Team feels pt reaching her goals sooner than expected so have moved up discharge date to 11/14. Pt agreeable and staff aware.

## 2023-06-24 NOTE — Progress Notes (Signed)
Occupational Therapy Session Note  Patient Details  Name: Rebekah Mendez MRN: [id number] Date of Birth: February 10, 1961  Today's Date: 06/24/2023 OT Individual Time: 9528-4132 OT Individual Time Calculation (min): 42 min   Today's Date: 06/24/2023 OT Individual Time: 1420-1530 OT Individual Time Calculation (min): 70 min   Short Term Goals: Week 1:  OT Short Term Goal 1 (Week 1): STGs=LTGs due to patient's estimated length of stay.  Skilled Therapeutic Interventions/Progress Updates:   Session 1: Pt greeted resting in bed for skilled OT session with focus on BADL retraining .   Pain: Pt with no reports of pain. OT offering intermediate rest breaks and positioning suggestions throughout session to address pain/fatigue and maximize participation/safety in session.   Functional Transfers: Pt performs all functional transfers with supervision progressing to Mod I without AD. Pt ambulates greater than household-level distance from room<>all therapy locations with supervision progressing to fully independent.   Self Care Tasks: Pt bathes at shower level with supervision progressing to Mod I, using grab bars intermediately for STS and dynamic standing balance. Dressing with same level of assistance. In ADL apartment, pt and OT review home kitchen setup, discussion surrounding cooking and cleaning, emphasis placed on safety awareness in regards to sensory deficits.  Therapeutic Activities: In main therapy gym, pt participates in functional reach activity with use of LUE, as she is tasked with pulling [place]igz from window at varying heights.    Education: Education provided on the use of daily tasks for NMR, including activities that require bimanual manipulation.   Pt remained sitting in recliner with 4Ps assessed and immediate needs met. Pt continues to be appropriate for skilled OT intervention to promote further functional independence in ADLs/IADLs.   Session 2: Pt greeted resting in bed for  skilled OT session with focus on functional transfers and L NMR HEP.   Pain: Pt with reports of fatigue at LUE with therapeutic activities. OT offering intermediate rest breaks and positioning suggestions throughout session to address pain/fatigue and maximize participation/safety in session.   Functional Transfers: Pt ambulates greater than household-level distance from room<>all therapy locations with supervision progressing to fully independent.   Self Care Tasks: Pt performs walk-in shower transfer with supervision, progressing to Mod I. Handout provided for shower seat (as patient is unsure of the one purchased by family) and suction-cup grab bar.   Therapeutic Activities: In day room, pt instructed in series of LUE NMR activities with use of household objects.   Therapeutic Exercise: LUE [facility] HEP reviewed & assigned.    Pt remained resting in bed with 4Ps assessed and immediate needs met. Pt continues to be appropriate for skilled OT intervention to promote further functional independence in ADLs/IADLs.   Therapy Documentation Precautions:  Precautions Precautions: Fall Precaution Comments: BP <180/105, L-hemi Restrictions Weight Bearing Restrictions: No   Therapy/Group: Individual Therapy  Lou Cal, OTR/L, MSOT  06/24/2023, 6:12 AM

## 2023-06-24 NOTE — Consult Note (Signed)
Neuropsychological Consultation Comprehensive Inpatient Rehab   Patient:   Rebekah Mendez   DOB:   14-Jul-1961  MR Number:  [id number]  Location:  [facility] [address] CENTER A [address] [place] [address] Dept: [phone] Loc: [phone]           Date of Service:   06/24/2023  Start Time:   1 PM End Time:   2 PM  Provider/Observer:  Arley Phenix, Psy.D.       Clinical Neuropsychologist       Billing Code/Service: [phone]  Reason for Service:    Rebekah Mendez is a 62 year old female referred for neuropsychological consultation as part of her ongoing care on the comprehensive inpatient rehabilitation unit.  Patient has a relatively unremarkable past medical history although due to issues with maintaining insurance and other financial matters has not been consistently seen by physicians for some time.  Patient presented on 06/14/2023 with acute onset of left-sided weakness and slurred speech.  Blood pressure was elevated at arrival and patient did receive TNK after code stroke called.  MRI showed a 1.6 cm acute ischemic nonhemorrhagic infarct in the right posterior basal ganglia.  There was also noted small remote hemorrhagic lacunar infarct in the right lentiform nucleus.  Patient notes that she has no awareness of when this previous stroke may have occurred.  Patient has had primary motor deficits with her left arm with motor deficits for left leg but improving.  During today's clinical interview/clinical visit, the patient was oriented x 4 and denied any significant changes in various cognitive functioning.  The patient was present along with her sister and they both engaged in relative questions about the patient's status and expectations going forward and all questions were answered to the best of my ability when appropriate.  The patient acknowledges a history of not being able to go see doctors on a regular basis and had  not been maintained with regard to issues related to her blood pressure that she was relatively unaware was present and having issues with.  Patient was in a positive mood state but acknowledged previous family risk factors that she had present.  We addressed issues regarding future care for health status particularly issues related to elevated blood pressure.  HPI for the current admission:    HPI: Rebekah Mendez is a 62 year old right-handed female with unremarkable past medical history except tobacco use. On no prescription medications and has not seen a physician in quite some time. Per chart review patient lives with spouse. 1 level home one-step to entry. Independent prior to admission working full-time. Presented 06/14/2023 with acute onset of left-sided weakness along with slurred speech. Noted blood pressure elevated systolic 225 on arrival. Cranial CT scan negative. Patient did receive TNK. CT head and neck no intracranial large vessel occlusion or significant stenosis. 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 Small remote hemorrhagic lacunar infarct at the right lentiform nucleus. Admission chemistries unremarkable except potassium 3.1, glucose 124, urine drug screen negative. Echocardiogram with ejection fraction of 70 to 75% no wall motion abnormalities. Neurology follow-up placed on low-dose aspirin and Plavix 75 mg daily x 3 weeks then aspirin alone. Lovenox added for DVT prophylaxis and lower extremity Dopplers negative for DVT. She is tolerating a regular consistency diet. Therapy evaluations completed due to patient decreased functional mobility left-sided weakness was admitted for a comprehensive rehab program.   Medical History:  Past Medical History:  Diagnosis Date   OSA (obstructive sleep apnea)    Thyroid activity decreased    late 20's         Patient Active Problem List   Diagnosis Date Noted    Coping style affecting medical condition 06/24/2023   Essential hypertension 06/17/2023   Tobacco use disorder 06/17/2023   Obesity 06/17/2023   Cerebrovascular accident (CVA) of right basal ganglia (HCC) 06/17/2023   Infarction of right basal ganglia (HCC) 06/13/2023    Behavioral Observation/Mental Status:   Rebekah Mendez  presents as a 62 y.o.-year-old right handed Caucasian Female who appeared her stated age. her dress was Appropriate and she was Well Groomed and her manners were Appropriate to the situation.  her participation was indicative of Appropriate behaviors.  There were physical disabilities noted.  she displayed an appropriate level of cooperation and motivation.    Interactions:    Active Appropriate and Attentive  Attention:   within normal limits and attention span and concentration were age appropriate  Memory:   within normal limits; recent and remote memory intact  Visuo-spatial:   not examined  Speech (Volume):  normal  Speech:   normal; normal  Thought Process:  Coherent  Coherent and Oriented  Though Content:  WNL; not suicidal and not homicidal  Orientation:   person, place, time/date, and situation  Judgment:   Good  Planning:   Fair  Affect:    Appropriate  Mood:    Euthymic  Insight:   Good  Intelligence:   normal  Psychiatric History:  No prior psychiatric history  Family Med/Psych History:  Family History  Problem Relation Age of Onset   Colon cancer Neg Hx     Impression/DX:   Rebekah Mendez is a 62 year old female referred for neuropsychological consultation as part of her ongoing care on the comprehensive inpatient rehabilitation unit.  Patient has a relatively unremarkable past medical history although due to issues with maintaining insurance and other financial matters has not been consistently seen by physicians for some time.  Patient presented on 06/14/2023 with acute onset of left-sided weakness and slurred speech.  Blood  pressure was elevated at arrival and patient did receive TNK after code stroke called.  MRI showed a 1.6 cm acute ischemic nonhemorrhagic infarct in the right posterior basal ganglia.  There was also noted small remote hemorrhagic lacunar infarct in the right lentiform nucleus.  Patient notes that she has no awareness of when this previous stroke may have occurred.  Patient has had primary motor deficits with her left arm with motor deficits for left leg but improving.  During today's clinical interview/clinical visit, the patient was oriented x 4 and denied any significant changes in various cognitive functioning.  The patient was present along with her sister and they both engaged in relative questions about the patient's status and expectations going forward and all questions were answered to the best of my ability when appropriate.  The patient acknowledges a history of not being able to go see doctors on a regular basis and had not been maintained with regard to issues related to her blood pressure that she was relatively unaware was present and having issues with.  Patient was in a positive mood state but acknowledged previous family risk factors that she had present.  We addressed issues regarding future care for health status particularly issues related to elevated blood pressure.  Disposition/Plan:  Today we worked on coping and adjustment issues with an expected discharge soon  and the patient is aware of the need to better manage her overall medical status particularly issues related to her blood pressure as well as that she has a previous diagnosis of obstructive sleep apnea.          Electronically Signed   _______________________ Arley Phenix, Psy.D. Clinical Neuropsychologist

## 2023-06-24 NOTE — Progress Notes (Signed)
Physical Therapy Session Note  Patient Details  Name: Rebekah Mendez MRN: [id number] Date of Birth: 1960/10/03  Today's Date: 06/24/2023 PT Individual Time: 1478-2956 PT Individual Time Calculation (min): 55 min   Short Term Goals: Week 1:  PT Short Term Goal 1 (Week 1): Pt will ambulate 150 feet with LRAD and CGA or less PT Short Term Goal 2 (Week 1): Pt will ascend/descend 2 6 inch steps with no handrails and LRAD with CGA or less PT Short Term Goal 3 (Week 1): Pt will performed stand pivot transfer with LRAD and supervision  Skilled Therapeutic Interventions/Progress Updates:      Pt seated in recliner upon arrival. Pt agreeable to therapy. Pt denies any pain.   Pt performed sit to stand and ambulated with no AD with mod I room to main gym.   Treatment Session focused on dual tasking, fall recovery, dynamic standing balance.   Discussed methods to reduce fall risk, including use of AD in community, step to gait (up with the good, down with the bad for stair navigation, removing any un necessary clutter, removing rugs) Discussed what to do in instance of fall,.   Pt performed fall recovery x2 with supervision, verbal cues provided for technique, with emphasis on tall kneeling to L hip flexion versus R, pt able to perform with mod I on 2nd trial.   Pt ambulated 2x 75 feet while holding 5# tidal tank and talking to therapist about pt plans for upcoming birthday and upcoming anniversary, pt reports L UE wrist fatigue requiring repositioning , and demos decreased L LE foot clearance with fatigue, vebral cues provided for heel toe pattern . Pt demos improved heel toe pattern on 2nd trial with improved awareness. Discussed indications that pt needs to take rest break-decreased foot clearance, catch in hip. Recommended pt take rest break if pt hears foot scuffing floor and unable to correct.   Discussed energy conservation technique for home and community mobility. Pt verbalized  understanding and agreeable.  Pt stood in semi tandem x 1 min bilaterally, on airex pad, with min A for placement of LE, pt demos good technique, no evidence of LOB.    Pt navigated 4 inch hurdles x10 leading with L LE, and CGA, x10 leading with L LE and CGA, with 1 episode of min A for LOB, verbal cues provided for increased L LE clearance when leading with R LE, pt demos improved carry over of LE clearance with navigating 10 hurdles with reciprocal and L HHA progressing to no UE support with CGA for stability.   Pt ambulated from main gym to room with sup/mod I, verbal cues provided for reciprocal arm swing.   Pt performed ambulatory transfer to bathroom with mod I. Pt continent of bladder and bowel, pt performed pericare and donned/doffed pants with mod I.   Pt supine in bed with bed alarm on and needs within reach.          Therapy Documentation Precautions:  Precautions Precautions: Fall Precaution Comments: BP <180/105, L-hemi Restrictions Weight Bearing Restrictions: No  Therapy/Group: Individual Therapy  [facility] Ambrose Finland, Spry, DPT  06/24/2023, 9:11 AM

## 2023-06-24 NOTE — Progress Notes (Signed)
Physical Therapy Session Note  Patient Details  Name: Rebekah Mendez MRN: [id number] Date of Birth: 08-Feb-1961  Today's Date: 06/24/2023 PT Individual Time: 4401-0272 PT Individual Time Calculation (min): 27 min   Short Term Goals: Week 1:  PT Short Term Goal 1 (Week 1): Pt will ambulate 150 feet with LRAD and CGA or less PT Short Term Goal 2 (Week 1): Pt will ascend/descend 2 6 inch steps with no handrails and LRAD with CGA or less PT Short Term Goal 3 (Week 1): Pt will performed stand pivot transfer with LRAD and supervision  Skilled Therapeutic Interventions/Progress Updates:    Pt presents in room seated EOB with sister present, agreeable to PT, denies pain. Session focused on NMR to promote BUE/BLE coordination and gait training with dual tasking.   Pt ambulates to bathroom with distant supervision, completes 3/3 toileting tasks with distant supervision then ambulates back to sink for hand hygiene. Pt then ambulates from room to day room with supervision and comes to sitting on EOM.  Pt completes NMR for BUE/BLE coordination and dual tasking with 1# med ball including:  - self ball toss x30 - standing self ball toss x30 - standing marching self ball toss x30 - walking self ball toss 175' - seated toss between BUEs x50 - standing toss between BUEs x50 - standing marches with ball toss between BUEs x50 - walking ball toss between BUEs 175'  Pt ambulates back to room supervision, demonstrating improved LUE arm swing. Pt returns to sitting and supine on bed with supervision, remains with all needs wihtin reach, call light in place, and pt sister at bedside at end of session.  Therapy Documentation Precautions:  Precautions Precautions: Fall Precaution Comments: BP <180/105, L-hemi Restrictions Weight Bearing Restrictions: No    Therapy/Group: Individual Therapy  Edwin Cap PT, DPT 06/24/2023, 11:28 AM

## 2023-06-24 NOTE — Progress Notes (Signed)
PROGRESS NOTE   Subjective/Complaints:  No acute events noted overnight.  Facial sensory abnormalities has resolved.  No new concerns this morning.  She is looking forward to going home on Thursday.   ROS: Patient denies fever, chills CP, SOB, N/V/D, no new changes in strength or sensation    Objective:   CT HEAD WO CONTRAST ( )  Result Date: 06/23/2023 CLINICAL DATA:  Neuro deficit, acute, stroke suspected. EXAM: CT HEAD WITHOUT CONTRAST TECHNIQUE: Contiguous axial images were obtained from the base of the skull through the vertex without intravenous contrast. RADIATION DOSE REDUCTION: This exam was performed according to the departmental dose-optimization program which includes automated exposure control, adjustment of the mA and/or kV according to patient size and/or use of iterative reconstruction technique. COMPARISON:  Head CT 06/14/2023.  MRI brain 06/13/2023. FINDINGS: Brain: Continued evolution of the now subacute infarct in the right posterior limb of the internal capsule and corona radiata (axial image 18 series 2). No acute hemorrhage or significant mass effect. No new loss of gray-white differentiation. No hydrocephalus or extra-axial collection. No mass effect or midline shift. Vascular: No hyperdense vessel or unexpected calcification. Skull: No calvarial fracture or suspicious bone lesion. Skull base is unremarkable. Sinuses/Orbits: No acute finding. Other: None. IMPRESSION: Continued evolution of the now subacute infarct in the right posterior limb of the internal capsule and corona radiata. No acute hemorrhage or significant mass effect. Electronically Signed   By: Orvan Falconer M.D.   On: 06/23/2023 11:18   No results for input(s): "WBC", "HGB", "HCT", "PLT" in the last 72 hours.  Recent Labs    06/24/23 0501  CREATININE 0.80     Intake/Output Summary (Last 24 hours) at 06/24/2023 1040 Last data filed at  06/24/2023 0857 Gross per 24 hour  Intake 540 ml  Output --  Net 540 ml        Physical Exam: Vital Signs Blood pressure 130/83, pulse 70, temperature 98.1 F (36.7 C), resp. rate 17, height 5' 4.5" (1.638 m), weight 101.1 kg, SpO2 97%.  Physical Exam Vitals and nursing note reviewed.   General: No acute distress Appropriate, pleasant this morning Heart: Regular rate and rhythm no rubs murmurs or extra sounds Lungs: Clear to auscultation, breathing unlabored, no rales or wheezes Abdomen: Positive bowel sounds, soft nontender to palpation, nondistended Extremities: No clubbing, cyanosis, or edema Skin: No evidence of breakdown, no evidence of rash  Neuro: Alert and awake,  follows simple commands,  Slight L Facial weakness when smiling  Dysarthria slight L side intact LT sensation in UE and LE  Facial sensation intact bilateral face-although she reports it is a little altered in her upper face  Musculoskeletal:     Cervical back: Neck supple. No tenderness.     Comments: RUE 5/5 LUE biceps 4/5; Triceps 4/5; WE 4/5; grip 4/5 and FA 3/5 RLE- 5/5 LLE- proximally 4/5 and distally 4+/5 Fine motor - intact finger to thumb opposition , slower on Left than on Right  Exam stable 11/11   Assessment/Plan: 1. Functional deficits which require 3+ hours per day of interdisciplinary therapy in a comprehensive inpatient rehab setting. Physiatrist is providing close team supervision and 24  hour management of active medical problems listed below. Physiatrist and rehab team continue to assess barriers to discharge/monitor patient progress toward functional and medical goals  Care Tool:  Bathing    Body parts bathed by patient: Left arm, Chest, Abdomen, Front perineal area, Buttocks, Right upper leg, Left upper leg, Left lower leg, Face, Right lower leg, Right arm   Body parts bathed by helper: Right arm     Bathing assist Assist Level: Supervision/Verbal cueing     Upper Body  Dressing/Undressing Upper body dressing   What is the patient wearing?: Pull over shirt    Upper body assist Assist Level: Independent    Lower Body Dressing/Undressing Lower body dressing      What is the patient wearing?: Underwear/pull up, Pants     Lower body assist Assist for lower body dressing: Supervision/Verbal cueing     Toileting Toileting Toileting Activity did not occur (Clothing management and hygiene only): N/A (no void or bm)  Toileting assist Assist for toileting: Supervision/Verbal cueing     Transfers Chair/bed transfer  Transfers assist     Chair/bed transfer assist level: Contact Guard/Touching assist     Locomotion Ambulation   Ambulation assist      Assist level: Contact Guard/Touching assist Assistive device: No Device Max distance: >153ft   Walk 10 feet activity   Assist     Assist level: Contact Guard/Touching assist Assistive device: No Device   Walk 50 feet activity   Assist    Assist level: Contact Guard/Touching assist Assistive device: No Device    Walk 150 feet activity   Assist Walk 150 feet activity did not occur: Safety/medical concerns  Assist level: Contact Guard/Touching assist Assistive device: No Device    Walk 10 feet on uneven surface  activity   Assist Walk 10 feet on uneven surfaces activity did not occur: Safety/medical concerns         Wheelchair     Assist Is the patient using a wheelchair?: Yes Type of Wheelchair: Manual    Wheelchair assist level: Minimal Assistance - Patient > 75% Max wheelchair distance: 100    Wheelchair 50 feet with 2 turns activity    Assist        Assist Level: Minimal Assistance - Patient > 75%   Wheelchair 150 feet activity     Assist      Assist Level: Moderate Assistance - Patient 50 - 74%   Blood pressure 130/83, pulse 70, temperature 98.1 F (36.7 C), resp. rate 17, height 5' 4.5" (1.638 m), weight 101.1 kg, SpO2  97%.   Medical Problem List and Plan: 1. 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             -patient may  shower             -ELOS/Goals: 7-10 days supervision  -Continue CIR   -Expected discharge 11/15  Resting hand splint ordered- reports she is using this at night  -CT head was ordered 11/11 due to left facial tingling and left orbital pressure-CT scan stable, symptoms have resolved    2.  Antithrombotics: -DVT/anticoagulation:  Pharmaceutical: Lovenox             -antiplatelet therapy: Aspirin 81 mg daily and Plavix 75 mg daily x 3 weeks then aspirin alone (start date 11/03)   3. Pain Management: Tylenol as needed, Robaxin as needed muscle spasms   4. Mood/Behavior/Sleep: Melatonin 3 mg nightly,  Xanax 0.25 mg twice daily as needed- not home medicine that I could tell- wasn't on meds at home at all.              -antipsychotic agents: N/A   5. Neuropsych/cognition: This patient is capable of making decisions on her own behalf.   6. Skin/Wound Care: Routine skin checks  -Monitor swelling site of prior IV infiltration. No signs of infection noted today. Continue warm compress . Appears to be gradually improving   7. Fluids/Electrolytes/Nutrition: Routine in and outs with follow-up chemistries   8.  Hypertension.  Norvasc 10 mg daily.  Monitor with increased mobility    06/24/2023    5:36 AM 06/23/2023    9:03 PM 06/23/2023    3:00 PM  Vitals with BMI  Systolic 130 132 161  Diastolic 83 78 74  Pulse 70 66 69    11/8 start lisinopril 2.5mg  daily- monitor effect- BP in good range 11/10  11/12 well-controlled continue current regimen   9.  Hyperlipidemia.  Lipitor   10.  Morbid Obesity.  BMI 38.60.  Dietary follow-up   11.  Tobacco use.  NicoDerm patch- discontinued.  Provide counseling   12.  Constipation.  MiraLAX daily, Senokot S twice daily.  -Will change mirlax to PRN per pt request,  -LBM 11/12 last BM  today improved   13. Needs PCP- hasn't seen physician in 7= years.   16. Prediabetes- educated on diagnosis and that needs to eat carbs in moderation.   -CBG stable 104  17. Dysphagia- improving   -Diet adjusted to D3 thin, will consult SLP  -Continue PPI  -Patient reported she is having some postnasal drip and congestion that might be contributing.  Will start Flonase  -She is back to heart healthy/thin diet   18.  Dysarthria post stroke- mild cont SLP   LOS: 7 days A FACE TO FACE EVALUATION WAS PERFORMED  Fanny Dance 06/24/2023, 10:40 AM

## 2023-06-25 ENCOUNTER — Other Ambulatory Visit (HOSPITAL_COMMUNITY): Payer: Self-pay

## 2023-06-25 NOTE — Progress Notes (Signed)
Physical Therapy Discharge Summary  Patient Details  Name: Rebekah Mendez MRN: [id number] Date of Birth: Jan 10, 1961  Date of Discharge from PT service:June 25, 2023  Today's Date: 06/25/2023 PT Individual Time: 2952-8413 PT Individual Time Calculation (min): 58 min    Patient has met 9 of 9 long term goals due to improved activity tolerance, improved balance, improved postural control, increased strength, increased range of motion, ability to compensate for deficits, improved attention, improved awareness, and improved coordination.  Patient to discharge at an ambulatory level Modified Independent with use of rollator in community.   Patient's care partner is independent to provide the necessary assistance at discharge. Family training completed on 11/13 with pt daughter-Rebekah Mendez.    Recommendation:  Patient will benefit from ongoing skilled PT services in outpatient setting to continue to advance safe functional mobility, address ongoing impairments in gait, balance, , and minimize fall risk.  Equipment: Recommending rollator  Reasons for discharge: treatment goals met and discharge from hospital  Patient/family agrees with progress made and goals achieved: Yes  PT Discharge Precautions/Restrictions Precautions Precautions: Fall Precaution Comments: BP <180/105, L-hemi Restrictions Weight Bearing Restrictions: No Pain Interference Pain Interference Pain Effect on Sleep: 1. Rarely or not at all Pain Interference with Therapy Activities: 1. Rarely or not at all Pain Interference with Day-to-Day Activities: 1. Rarely or not at all Vision/Perception  Vision - History Ability to See in Adequate Light: 0 Adequate Perception Perception: Within Functional Limits Praxis Praxis: WFL  Cognition Overall Cognitive Status: Within Functional Limits for tasks assessed Arousal/Alertness: Awake/alert Orientation Level: Oriented X4 Memory: Appears intact Awareness: Appears  intact Problem Solving: Appears intact Safety/Judgment: Appears intact Sensation Sensation Light Touch: Impaired Detail Peripheral sensation comments: LUE numbness/stiffness (primarily in 2nd/3rd). Light Touch Impaired Details: Impaired LUE Hot/Cold: Appears Intact Proprioception: Appears Intact Stereognosis: Appears Intact Additional Comments: pt able to detect light touch in all dermatomes B LE, reports same sensation B LE, pt denies any tingling in weight bearing. Coordination Gross Motor Movements are Fluid and Coordinated: No Fine Motor Movements are Fluid and Coordinated: No Coordination and Movement Description: Deficits due to L-side hemiparesis (LUE>LLE) Finger Nose Finger Test: Dysmetria L UE Heel Shin Test: L LE slower Motor  Motor Motor: Hemiplegia Motor - Skilled Clinical Observations: L Hemi  Mobility Bed Mobility Bed Mobility: Rolling Right;Rolling Left;Supine to Sit;Sit to Supine Rolling Right: Independent with assistive device Rolling Left: Independent with assistive device Supine to Sit: Independent with assistive device Sit to Supine: Independent with assistive device Transfers Transfers: Sit to Stand;Stand to Sit;Stand Pivot Transfers Sit to Stand: Independent Stand to Sit: Independent Stand Pivot Transfers: Independent Transfer (Assistive device): None Locomotion  Gait Ambulation: Yes Gait Assistance: Independent with assistive device Gait Distance (Feet): 300 Feet Assistive device: Rollator Gait Gait: Yes Gait Pattern: Impaired Gait Pattern: Step-through pattern;Decreased stance time - left;Decreased dorsiflexion - left;Decreased weight shift to left Gait velocity: decr Stairs / Additional Locomotion Stairs: Yes Stairs Assistance: Supervision/Verbal cueing Stair Management Technique: No rails Number of Stairs: 8 Height of Stairs: 3 Wheelchair Mobility Wheelchair Mobility: No  Trunk/Postural Assessment  Cervical Assessment Cervical  Assessment: Within Functional Limits Thoracic Assessment Thoracic Assessment: Within Functional Limits Lumbar Assessment Lumbar Assessment: Within Functional Limits Postural Control Postural Control: Within Functional Limits  Balance Balance Balance Assessed: Yes Standardized Balance Assessment Standardized Balance Assessment: Berg Balance Test Berg Balance Test Sit to Stand: Able to stand without using hands and stabilize independently Standing Unsupported: Able to stand safely 2 minutes Sitting with Back Unsupported but  Feet Supported on Floor or Stool: Able to sit safely and securely 2 minutes Stand to Sit: Sits safely with minimal use of hands Transfers: Able to transfer safely, minor use of hands Standing Unsupported with Eyes Closed: Able to stand 10 seconds safely Standing Ubsupported with Feet Together: Able to place feet together independently and stand 1 minute safely From Standing, Reach Forward with Outstretched Arm: Can reach forward >12 cm safely (5") From Standing Position, Pick up Object from Floor: Able to pick up shoe, needs supervision From Standing Position, Turn to Look Behind Over each Shoulder: Looks behind from both sides and weight shifts well Turn 360 Degrees: Able to turn 360 degrees safely in 4 seconds or less Standing Unsupported, Alternately Place Feet on Step/Stool: Able to complete 4 steps without aid or supervision Standing Unsupported, One Foot in Front: Able to take small step independently and hold 30 seconds Standing on One Leg: Able to lift leg independently and hold 5-10 seconds Total Score: 49 Static Sitting Balance Static Sitting - Balance Support: Feet supported Static Sitting - Level of Assistance: 7: Independent Dynamic Sitting Balance Dynamic Sitting - Balance Support: During functional activity Dynamic Sitting - Level of Assistance: 6: Modified independent (Device/Increase time) Dynamic Sitting - Balance Activities: Lateral lean/weight  shifting;Forward lean/weight shifting;Reaching for objects;Reaching across midline Static Standing Balance Static Standing - Balance Support: No upper extremity supported Static Standing - Level of Assistance: 7: Independent Dynamic Standing Balance Dynamic Standing - Balance Support: During functional activity Dynamic Standing - Level of Assistance: 6: Modified independent (Device/Increase time) Dynamic Standing - Balance Activities: Lateral lean/weight shifting;Forward lean/weight shifting;Reaching for objects Extremity Assessment  LUE Assessment LUE Assessment: Exceptions to [facility] Active Range of Motion (AROM) Comments: WFL General Strength Comments: WFL LUE Body System: Neuro Brunstrum levels for arm and hand: Arm;Hand Brunstrum level for arm: Stage V Relative Independence from Synergy Brunstrum level for hand: Stage VI Isolated joint movements RLE Assessment RLE Assessment: Within Functional Limits LLE Assessment LLE Assessment: Exceptions to [facility] General Strength Comments: grossly 4+/5 in seated positioning   Today's Interventions  Pt seated in recliner upon arrival. Pt agreeable to therapy. Pt denies any pain. Pt daughter Rebekah Mendez present for family training.   Education provided on no need for AD inside house, but needs AD for community mobility. Reviewed how to fold Rollator for transport, pt and daughter returned demonstration. Education provided on pt need for supervision with home stair navigation, and CGA with community stair navigation if no handrails are present,  with emphasis on step to gait up with the good and down with the bad for home stairs, and in community if one or no handrail present, pt and daughter able to provide teachback, and return demonstration.   Pt navigated 8 3 inch steps with no handrails and step to gait with supervision  Pt navigated 4 6 inch steps with B HR and reciprocal gait with supervision   Pt navigated 4 6inch steps with unilateral handrail and  step to gait with CGA  Pt navigated 4 6inch steps with no handrails and CGA.   Pt able to provide teach back of method used to perform car transfer.   6 Min Walk Test: Instructed patient to ambulate as quickly and as safely as possible for 6 minutes using LRAD. Patient was allowed to take standing rest breaks without stopping the test, but if the patient required a sitting rest break the clock would be stopped and the test would be over.  Results: 742 feet  using no AD with supervision. Results indicate that the patient endurance has improved from 476 feet and pt endurance is above age matched norms.  Age Matched Norms: 40-69 yo M: 41 F: 48, 19-79 yo M: 73 F: 471, 26-89 yo M: 417 F: 392 MDC: 58.21 meters (190.98 feet) or 50 meters (ANPTA Core Set of Outcome Measures for Adults with Neurologic Conditions, 2018)  Patient demonstrates moderate fall risk as noted by score of  49/56 (previously 43) on Berg Balance Scale.  (<36= high risk for falls, close to 100%; 37-45 significant >80%; 46-51 moderate >50%; 52-55 lower >25%).   Reviewed extensive HEP previously provided for improving dynamic standing balance Pt denies any questions/concerns. Reviewed and performed hip flexor stretch and groin stretch, provided handout. Education provided to hold for 30 seconds-1 minute for adequate stretch.   Pt supine in bed with needs within reach and daughter in room at end of session.      [facility] [place], [place], DPT  06/25/2023, 7:46 AM

## 2023-06-25 NOTE — Plan of Care (Signed)
  Problem: RH Balance Goal: LTG Patient will maintain dynamic standing with ADLs (OT) Description: LTG:  Patient will maintain dynamic standing balance with assist during activities of daily living (OT)  Outcome: Completed/Met   Problem: RH Eating Goal: LTG Patient will perform eating w/assist, cues/equip (OT) Description: LTG: Patient will perform eating with assist, with/without cues using equipment (OT) Outcome: Completed/Met   Problem: RH Grooming Goal: LTG Patient will perform grooming w/assist,cues/equip (OT) Description: LTG: Patient will perform grooming with assist, with/without cues using equipment (OT) Outcome: Completed/Met   Problem: RH Bathing Goal: LTG Patient will bathe all body parts with assist levels (OT) Description: LTG: Patient will bathe all body parts with assist levels (OT) Outcome: Completed/Met   Problem: RH Dressing Goal: LTG Patient will perform upper body dressing (OT) Description: LTG Patient will perform upper body dressing with assist, with/without cues (OT). Outcome: Completed/Met Goal: LTG Patient will perform lower body dressing w/assist (OT) Description: LTG: Patient will perform lower body dressing with assist, with/without cues in positioning using equipment (OT) Outcome: Completed/Met   Problem: RH Toileting Goal: LTG Patient will perform toileting task (3/3 steps) with assistance level (OT) Description: LTG: Patient will perform toileting task (3/3 steps) with assistance level (OT)  Outcome: Completed/Met   Problem: RH Functional Use of Upper Extremity Goal: LTG Patient will use RT/LT upper extremity as a (OT) Description: LTG: Patient will use right/left upper extremity as a stabilizer/gross assist/diminished/nondominant/dominant level with assist, with/without cues during functional activity (OT) Outcome: Completed/Met   Problem: RH Toilet Transfers Goal: LTG Patient will perform toilet transfers w/assist (OT) Description: LTG:  Patient will perform toilet transfers with assist, with/without cues using equipment (OT) Outcome: Completed/Met   Problem: RH Tub/Shower Transfers Goal: LTG Patient will perform tub/shower transfers w/assist (OT) Description: LTG: Patient will perform tub/shower transfers with assist, with/without cues using equipment (OT) Outcome: Completed/Met

## 2023-06-25 NOTE — Patient Care Conference (Signed)
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 Fanny Dance Social Worker Present: Dossie Der, LCSW Nurse Present: Chana Bode, RN PT Present: Ambrose Finland, PT OT Present: Lou Cal, OT PPS Coordinator present : Fae Pippin, SLP     Current Status/Progress Goal Weekly Team Focus  Bowel/Bladder   Continent of B/B. LBM 06/22/2023   Maintain Continence   Assist with BR needs    Swallow/Nutrition/ Hydration               ADL's   Pt functioning at Mod I level for ADLs. Pt does not require BSC, family has purchased shower seat. HEP for LUE FMC assigned.   Mod I   Discharge planning.    Mobility   pt at goal level-completing fall recovery today. Recommending no AD in home, and rollator for community. Pt has equipment needed. HEP has been provided. Family training has been completed with daugher Lita Mains I/supervision  D/C 11/14 grad day 11/13    Communication                Safety/Cognition/ Behavioral Observations               Pain   Denies pain   Remain pain free   Assess Q4 and prn    Skin   Intact.   Maintain integrity  Assess QS and prn.      Discharge Planning:  Home with husband and family members did well here and reached goals sooner. Has all DME and home exercise program given. Match placed for assistance with prescriptions ready for DC  tomorrow   Team Discussion: Patient post right basal ganglia CVA with left side numbness and weakness. Episode of facial numbness; CT WNL.  Patient on target to meet rehab goals: yes, patient has met goals and is ready for discharge.  *See Care Plan and progress notes for long and short-term goals.   Revisions to Treatment Plan:  N/a  Teaching Needs: Safety, medication, dietary modification, transfers, toileting, etc.   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, HTN, constipation, dysphagia, dysarthria Possible Resolutions to [employer]: continue education in preperation for DC tomororow, monitor BP   Continued Need for Acute Rehabilitation Level of Care: The patient requires daily medical management by a physician with specialized training in physical medicine and rehabilitation for the following reasons: Direction of a multidisciplinary physical rehabilitation program to maximize functional independence : Yes Medical management of patient stability for increased activity during participation in an intensive rehabilitation regime.: Yes Analysis of laboratory values and/or radiology reports with  any subsequent need for medication adjustment and/or medical intervention. : Yes   I attest that I was present, lead the team conference, and concur with the assessment and plan of the team.   Chana Bode B 06/25/2023, 1:30 PM

## 2023-06-25 NOTE — Progress Notes (Signed)
Inpatient Rehabilitation Discharge Medication Review by a Pharmacist  A complete drug regimen review was completed for this patient to identify any potential clinically significant medication issues.  High Risk Drug Classes Is patient taking? Indication by Medication  Antipsychotic {Receiving?:26196}   Anticoagulant {Receiving?:26196}   Antibiotic {Receiving?:26196}   Opioid {Receiving?:26196}   Antiplatelet {Receiving?:26196}   Hypoglycemics/insulin {Yes or No?:26198}   Vasoactive Medication {Receiving?:26196}   Chemotherapy {Receiving Chemo?:26197}   Other {Yes or No?:26198}      Type of Medication Issue Identified Description of Issue Recommendation(s)  Drug Interaction(s) (clinically significant)     Duplicate Therapy     Allergy     No Medication Administration End Date     Incorrect Dose     Additional Drug Therapy Needed     Significant med changes from prior encounter (inform family/care partners about these prior to discharge).  Restart or discontinue as appropriate. Communicate medication changes with patient/family at discharge  Other       Clinically significant medication issues were identified that warrant physician communication and completion of prescribed/recommended actions by midnight of the next day:  {Yes or No?:26198}  Name of provider notified for urgent issues identified: ***   Provider Method of Notification: ***    Pharmacist comments: ***   Time spent performing this drug regimen review (minutes): 30   Thank you for allowing pharmacy to be a part of this patient's care.   Signe Colt, PharmD 06/25/2023 11:23 AM    **Pharmacist phone directory can be found on [email or web address] listed under [facility]**

## 2023-06-25 NOTE — Progress Notes (Signed)
Occupational Therapy Discharge Summary  Patient Details  Name: Rebekah Mendez MRN: [id number] Date of Birth: 24-Mar-1961  Date of Discharge from OT service:June 25, 2023  Today's Date: 06/25/2023 OT Individual Time: 9528-4132 OT Individual Time Calculation (min): 40 min    Patient has met 10 of 10 long term goals due to improved activity tolerance, improved balance, postural control, ability to compensate for deficits, functional use of  LEFT upper and LEFT lower extremity, improved attention, improved awareness, and improved coordination.  Patient to discharge at overall Modified Independent level.  Patient's Mendez partner is independent to provide the necessary physical and cognitive assistance at discharge for IADL & community-level participation.  Reasons goals not met: N/A  Recommendation:  Patient will benefit from ongoing skilled OT services in outpatient setting to continue to advance functional skills in the area of BADL, iADL, Vocation, and Reduce Mendez partner burden.  Equipment: Shower chair  Reasons for discharge: treatment goals met and discharge from hospital  Patient/family agrees with progress made and goals achieved: Yes  OT Discharge Precautions/Restrictions  Precautions Precautions: Fall Precaution Comments: BP <180/105, L-hemi Restrictions Weight Bearing Restrictions: No Pain Pain Assessment Pain Scale: 0-10 Pain Score: 0-No pain ADL ADL Eating: Independent Where Assessed-Eating: Chair Grooming: Independent Where Assessed-Grooming: Standing at sink Upper Body Bathing: Modified independent Where Assessed-Upper Body Bathing: Shower Lower Body Bathing: Modified independent Where Assessed-Lower Body Bathing: Shower Upper Body Dressing: Modified independent (Device) Where Assessed-Upper Body Dressing: Edge of bed Lower Body Dressing: Modified independent Where Assessed-Lower Body Dressing: Edge of bed Toileting: Modified independent Where  Assessed-Toileting: Teacher, adult education: Community education officer Method: Proofreader: Other (comment) (Intermediate use of grab bars, not required for safe transfer.) Tub/Shower Transfer: Unable to assess Film/video editor: Modified independent Film/video editor Method: Designer, industrial/product: Information systems manager without back Vision Baseline Vision/History: 1 Wears glasses Patient Visual Report: No change from baseline Vision Assessment?: No apparent visual deficits Perception  Perception: Within Functional Limits Praxis Praxis: WFL Cognition Cognition Overall Cognitive Status: Within Functional Limits for tasks assessed Arousal/Alertness: Awake/alert Orientation Level: Person;Place;Situation Memory: Appears intact Awareness: Appears intact Problem Solving: Appears intact Safety/Judgment: Appears intact Brief Interview for Mental Status (BIMS) Repetition of Three Words (First Attempt): 3 Temporal Orientation: Year: Correct Temporal Orientation: Month: Accurate within 5 days Temporal Orientation: Day: Correct Recall: "Sock": Yes, no cue required Recall: "Blue": Yes, no cue required Recall: "Bed": Yes, no cue required BIMS Summary Score: 15 Sensation Sensation Light Touch: Impaired Detail Peripheral sensation comments: LUE numbness/stiffness (primarily in 2nd/3rd). Light Touch Impaired Details: Impaired LUE;Impaired LLE Hot/Cold: Appears Intact Proprioception: Appears Intact Stereognosis: Appears Intact Coordination Gross Motor Movements are Fluid and Coordinated: Yes Fine Motor Movements are Fluid and Coordinated: No Coordination and Movement Description: Deficits due to L-side hemiparesis (LUE>LLE) Finger Nose Finger Test: Dysmetria L UE Motor  Motor Motor: Hemiplegia Motor - Skilled Clinical Observations: L Hemi Mobility  Bed Mobility Bed Mobility: Rolling Right;Rolling Left;Supine to Sit;Sit to Supine Rolling  Right: Independent with assistive device Rolling Left: Independent with assistive device Supine to Sit: Independent with assistive device Sit to Supine: Independent with assistive device Transfers Sit to Stand: Independent Stand to Sit: Independent  Trunk/Postural Assessment  Cervical Assessment Cervical Assessment: Within Functional Limits Thoracic Assessment Thoracic Assessment: Within Functional Limits Lumbar Assessment Lumbar Assessment: Within Functional Limits Postural Control Postural Control: Within Functional Limits  Balance Balance Balance Assessed: Yes Static Sitting Balance Static Sitting - Balance Support: Feet supported Static Sitting - Level of  Assistance: 7: Independent Dynamic Sitting Balance Dynamic Sitting - Balance Support: During functional activity Dynamic Sitting - Level of Assistance: 6: Modified independent (Device/Increase time) Dynamic Sitting - Balance Activities: Lateral lean/weight shifting;Forward lean/weight shifting;Reaching for objects;Reaching across midline Static Standing Balance Static Standing - Balance Support: No upper extremity supported Static Standing - Level of Assistance: 7: Independent Dynamic Standing Balance Dynamic Standing - Balance Support: During functional activity Dynamic Standing - Level of Assistance: 6: Modified independent (Device/Increase time) Dynamic Standing - Balance Activities: Lateral lean/weight shifting;Forward lean/weight shifting;Reaching for objects Extremity/Trunk Assessment RUE Assessment RUE Assessment: Within Functional Limits LUE Assessment LUE Assessment: Exceptions to [facility] Active Range of Motion (AROM) Comments: WFL General Strength Comments: WFL LUE Body System: Neuro Brunstrum levels for arm and hand: Arm;Hand Brunstrum level for arm: Stage V Relative Independence from Synergy Brunstrum level for hand: Stage VI Isolated joint movements  Skilled Intervention: Pt greeted sitting in recliner for  skilled OT session with focus on discharge planning.   Pain: Pt with no reports of pain, OT offering intermediate rest breaks and positioning suggestions throughout session to address potential pain/fatigue and maximize participation/safety in session.   Functional Transfers: Pt completes all room-level transfers and functional mobility from room<>day room with distant supervision progressing to Mod I/full-independence.   Self Mendez Tasks: Pt completes 3/3 toileting activities with distant supervision progressing to Mod I/full-independence. Pt changes t-shirt/bra with Mod I, performing sink-side grooming independently.   Therapeutic Exercise: In day room, pt participates in Nustep cycles of ~3 mins long, 1 min rest break, for generalized conditioning and bimanual task. L-handled adapted with tape for increased challenge and visual feedback of L-hand placement.    Education: Pt educated on the benefits of night-lights for night-time toileting to prevent falls.   Pt remained sitting in recliner with 4Ps assessed and immediate needs met. Pt continues to be appropriate for skilled OT intervention to promote further functional independence in ADLs/IADLs.   Lou Cal, OTR/L, MSOT  06/25/2023, 7:56 AM

## 2023-06-25 NOTE — Progress Notes (Signed)
   06/25/23 2031  BiPAP/CPAP/SIPAP  $ Non-Invasive Home Ventilator  Subsequent  BiPAP/CPAP/SIPAP Pt Type Adult  BiPAP/CPAP/SIPAP Resmed  Mask Type Nasal pillows  FiO2 (%) 21 %  Patient Home Equipment Yes  Auto Titrate Yes

## 2023-06-25 NOTE — Progress Notes (Signed)
Occupational Therapy Session Note  Patient Details  Name: Rebekah Mendez MRN: [id number] Date of Birth: 07-Apr-1961  Today's Date: 06/25/2023 OT Individual Time: 4098-1191 OT Individual Time Calculation (min): 40 min    Short Term Goals: Week 1:  OT Short Term Goal 1 (Week 1): STGs=LTGs due to patient's estimated length of stay.  Skilled Therapeutic Interventions/Progress Updates:    Pt resting on EOB upon arrival. Drt present. OT intervention with focus on functional amb without AD, LUE FMC/gross motor, and balance tasks to increase independence with BADLs. Pt amb to day room for table tasks. Pt issued handout with FMC/gross motor tasks to be completed at home after discharge. Pt practiced picking up poker chips and small coins. In hand manipulation tasks with coins. Pt issued deck of cards to practice shuffling (not successful) and dealing cards out individually. Squigz task standing at window. Pt donned gloves and cleaned all iteams. Pt practiced kicking Hoverball in hallway back to room. Pt remained setaed EOB. Educated dtr on activities to be completed at home. Pt remained seated EOB with dtr present.   Therapy Documentation Precautions:  Precautions Precautions: Fall Precaution Comments: BP <180/105, L-hemi Restrictions Weight Bearing Restrictions: No   Pain:  Pt denies pain this morning  Therapy/Group: Individual Therapy  Rich Brave 06/25/2023, 12:04 PM

## 2023-06-25 NOTE — Plan of Care (Signed)
  Problem: RH Balance Goal: LTG Patient will maintain dynamic standing balance (PT) Description: LTG:  Patient will maintain dynamic standing balance with assistance during mobility activities (PT) Outcome: Completed/Met   Problem: Sit to Stand Goal: LTG:  Patient will perform sit to stand with assistance level (PT) Description: LTG:  Patient will perform sit to stand with assistance level (PT) Outcome: Completed/Met   Problem: RH Bed Mobility Goal: LTG Patient will perform bed mobility with assist (PT) Description: LTG: Patient will perform bed mobility with assistance, with/without cues (PT). Outcome: Completed/Met   Problem: RH Bed to Chair Transfers Goal: LTG Patient will perform bed/chair transfers w/assist (PT) Description: LTG: Patient will perform bed to chair transfers with assistance (PT). Outcome: Completed/Met   Problem: RH Car Transfers Goal: LTG Patient will perform car transfers with assist (PT) Description: LTG: Patient will perform car transfers with assistance (PT). Outcome: Completed/Met   Problem: RH Furniture Transfers Goal: LTG Patient will perform furniture transfers w/assist (OT/PT) Description: LTG: Patient will perform furniture transfers  with assistance (OT/PT). Outcome: Completed/Met   Problem: RH Ambulation Goal: LTG Patient will ambulate in controlled environment (PT) Description: LTG: Patient will ambulate in a controlled environment, # of feet with assistance (PT). Outcome: Completed/Met Goal: LTG Patient will ambulate in home environment (PT) Description: LTG: Patient will ambulate in home environment, # of feet with assistance (PT). Outcome: Completed/Met   Problem: RH Stairs Goal: LTG Patient will ambulate up and down stairs w/assist (PT) Description: LTG: Patient will ambulate up and down # of stairs with assistance (PT) Outcome: Completed/Met   

## 2023-06-25 NOTE — Progress Notes (Addendum)
PROGRESS NOTE   Subjective/Complaints:  No acute events yesterday. No new concerns this AM.  We discussed discharge plan.    ROS: Patient denies fever, vision changes, chills CP, SOB, N/V/D,  HA, no new changes in strength or sensation    Objective:   CT HEAD WO CONTRAST ( )  Result Date: 06/23/2023 CLINICAL DATA:  Neuro deficit, acute, stroke suspected. EXAM: CT HEAD WITHOUT CONTRAST TECHNIQUE: Contiguous axial images were obtained from the base of the skull through the vertex without intravenous contrast. RADIATION DOSE REDUCTION: This exam was performed according to the departmental dose-optimization program which includes automated exposure control, adjustment of the mA and/or kV according to patient size and/or use of iterative reconstruction technique. COMPARISON:  Head CT 06/14/2023.  MRI brain 06/13/2023. FINDINGS: Brain: Continued evolution of the now subacute infarct in the right posterior limb of the internal capsule and corona radiata (axial image 18 series 2). No acute hemorrhage or significant mass effect. No new loss of gray-white differentiation. No hydrocephalus or extra-axial collection. No mass effect or midline shift. Vascular: No hyperdense vessel or unexpected calcification. Skull: No calvarial fracture or suspicious bone lesion. Skull base is unremarkable. Sinuses/Orbits: No acute finding. Other: None. IMPRESSION: Continued evolution of the now subacute infarct in the right posterior limb of the internal capsule and corona radiata. No acute hemorrhage or significant mass effect. Electronically Signed   By: Orvan Falconer M.D.   On: 06/23/2023 11:18   No results for input(s): "WBC", "HGB", "HCT", "PLT" in the last 72 hours.  Recent Labs    06/24/23 0501  CREATININE 0.80     Intake/Output Summary (Last 24 hours) at 06/25/2023 0842 Last data filed at 06/25/2023 0700 Gross per 24 hour  Intake 960 ml   Output --  Net 960 ml        Physical Exam: Vital Signs Blood pressure (!) 114/59, pulse 69, temperature 98.5 F (36.9 C), temperature source Oral, resp. rate 18, height 5' 4.5" (1.638 m), weight 101.1 kg, SpO2 96%.  Physical Exam Vitals and nursing note reviewed.   General: No acute distress, appears comfortable sitting in chair  Appropriate, pleasant this morning Heart: Regular rate and rhythm no rubs murmurs or extra sounds Lungs: Clear to auscultation, breathing unlabored, no rales or wheezes Abdomen: Positive bowel sounds, soft nontender to palpation, nondistended Extremities: No clubbing, cyanosis, or edema Skin: No evidence of breakdown, no evidence of rash  Neuro: Alert and awake,  follows simple commands,  Slight L Facial weakness when smiling  Dysarthria slight L side intact LT sensation in UE and LE  Facial sensation intact bilateral face-although she reports it is a little altered in her upper face  Musculoskeletal:  No joint swelling noted, no ROM deficits noted     Cervical back: Neck supple. No tenderness.     Comments: RUE 5/5 LUE biceps 4/5; Triceps 4/5; WE 4/5; grip 4/5 and FA 3/5 RLE- 5/5 LLE- proximally 4/5 and distally 4+/5 Fine motor - intact finger to thumb opposition , slower on Left than on Right  Exam stable 11/13   Assessment/Plan: 1. Functional deficits which require 3+ hours per day of interdisciplinary therapy in a comprehensive  inpatient rehab setting. Physiatrist is providing close team supervision and 24 hour management of active medical problems listed below. Physiatrist and rehab team continue to assess barriers to discharge/monitor patient progress toward functional and medical goals  Care Tool:  Bathing    Body parts bathed by patient: Left arm, Chest, Abdomen, Front perineal area, Buttocks, Right upper leg, Left upper leg, Left lower leg, Face, Right lower leg, Right arm   Body parts bathed by helper: Right arm     Bathing  assist Assist Level: Independent with assistive device     Upper Body Dressing/Undressing Upper body dressing   What is the patient wearing?: Pull over shirt, Bra    Upper body assist Assist Level: Independent with assistive device    Lower Body Dressing/Undressing Lower body dressing      What is the patient wearing?: Underwear/pull up, Pants     Lower body assist Assist for lower body dressing: Independent with assitive device     Toileting Toileting Toileting Activity did not occur (Clothing management and hygiene only): N/A (no void or bm)  Toileting assist Assist for toileting: Independent with assistive device     Transfers Chair/bed transfer  Transfers assist     Chair/bed transfer assist level: Contact Guard/Touching assist     Locomotion Ambulation   Ambulation assist      Assist level: Contact Guard/Touching assist Assistive device: No Device Max distance: >147ft   Walk 10 feet activity   Assist     Assist level: Contact Guard/Touching assist Assistive device: No Device   Walk 50 feet activity   Assist    Assist level: Contact Guard/Touching assist Assistive device: No Device    Walk 150 feet activity   Assist Walk 150 feet activity did not occur: Safety/medical concerns  Assist level: Contact Guard/Touching assist Assistive device: No Device    Walk 10 feet on uneven surface  activity   Assist Walk 10 feet on uneven surfaces activity did not occur: Safety/medical concerns         Wheelchair     Assist Is the patient using a wheelchair?: Yes Type of Wheelchair: Manual    Wheelchair assist level: Minimal Assistance - Patient > 75% Max wheelchair distance: 100    Wheelchair 50 feet with 2 turns activity    Assist        Assist Level: Minimal Assistance - Patient > 75%   Wheelchair 150 feet activity     Assist      Assist Level: Moderate Assistance - Patient 50 - 74%   Blood pressure (!)  114/59, pulse 69, temperature 98.5 F (36.9 C), temperature source Oral, resp. rate 18, height 5' 4.5" (1.638 m), weight 101.1 kg, SpO2 96%.   Medical Problem List and Plan: 1. 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             -patient may  shower             -ELOS/Goals: 7-10 days supervision  -Continue CIR   -Expected discharge 11/15  Resting hand splint ordered- reports she is using this at night  -CT head was ordered 11/11 due to left facial tingling and left orbital pressure-CT scan stable, symptoms have resolved  -Team conference today please see physician documentation under team conference tab, met with team  to discuss problems,progress, and goals. Formulized individual treatment plan based on medical history, underlying problem and comorbidities.   -Discussed neuro changes  to look out for after DC that would necessitate going to ED for evaluation.     2.  Antithrombotics: -DVT/anticoagulation:  Pharmaceutical: Lovenox             -antiplatelet therapy: Aspirin 81 mg daily and Plavix 75 mg daily x 3 weeks then aspirin alone (start date 11/03)   3. Pain Management: Tylenol as needed, Robaxin as needed muscle spasms   4. Mood/Behavior/Sleep: Melatonin 3 mg nightly, Xanax 0.25 mg twice daily as needed- not home medicine that I could tell- wasn't on meds at home at all.              -antipsychotic agents: N/A   5. Neuropsych/cognition: This patient is capable of making decisions on her own behalf.   6. Skin/Wound Care: Routine skin checks  -Monitor swelling site of prior IV infiltration. No signs of infection noted today. Continue warm compress . Appears to be gradually improving   7. Fluids/Electrolytes/Nutrition: Routine in and outs with follow-up chemistries   8.  Hypertension.  Norvasc 10 mg daily.  Monitor with increased mobility    06/25/2023    4:25 AM 06/24/2023    8:15 PM 06/24/2023    1:22 PM  Vitals  with BMI  Systolic 114 139 409  Diastolic 59 67 67  Pulse 69 66 71    11/8 start lisinopril 2.5mg  daily- monitor effect- BP in good range 11/10  11/13 well controlled, she reports plan to get BP cuff for home use, I think this would be a good idea, discussed recording BP in log for PCP   9.  Hyperlipidemia.  Lipitor   10.  Morbid Obesity.  BMI 38.60.  Dietary follow-up   11.  Tobacco use.  NicoDerm patch- discontinued.  Provide counseling   12.  Constipation.  MiraLAX daily, Senokot S twice daily.  -Will change mirlax to PRN per pt request,  -11/13 LBM yesterday   13. Needs PCP- hasn't seen physician in 7= years.   16. Prediabetes- educated on diagnosis and that needs to eat carbs in moderation.   -CBG stable 104  17. Dysphagia- improving   -Diet adjusted to D3 thin, will consult SLP  -Continue PPI  -Patient reported she is having some postnasal drip and congestion that might be contributing.  Will start Flonase  -She is back to heart healthy/thin diet   18.  Dysarthria post stroke- mild cont SLP, improving   LOS: 8 days A FACE TO FACE EVALUATION WAS PERFORMED  Fanny Dance 06/25/2023, 8:42 AM

## 2023-06-25 NOTE — Progress Notes (Signed)
Physical Therapy Session Note  Patient Details  Name: Rebekah Mendez MRN: [id number] Date of Birth: [birth year]  Today's Date: 06/25/2023 PT Individual Time: 1407-1500 PT Individual Time Calculation (min): 53 min   Short Term Goals: Week 1:  PT Short Term Goal 1 (Week 1): Pt will ambulate 150 feet with LRAD and CGA or less PT Short Term Goal 2 (Week 1): Pt will ascend/descend 2 6 inch steps with no handrails and LRAD with CGA or less PT Short Term Goal 3 (Week 1): Pt will performed stand pivot transfer with LRAD and supervision  Skilled Therapeutic Interventions/Progress Updates:    Pt received sitting EOB with her daughter present and pt agreeable to therapy session. Pt denies any questions/concerns regarding D/C and reports feeling confident with plan to go home tomorrow. Pt reports being uninsured and likely unable to afford follow-up therapies - pt has been provided with HEPs from primary OT and PT. Pt reports her primary deficits is L hand weakness especially with grasp - therapist educated her on option of doing at home modified CIMT (constraint induced movement therapy) to encourage forced use of her L hand via putting an oven mit on her R hand. Pt performed all transfers mod-I with rollator or I without R throughout session. Pt ambulated throughout CIR >533ft using rollator mod-I. Participated in dynamic gait and dual-task challenge in the kitchen without AD including collecting items from pantry, kitchen cabinets, and refrigerator to simulate organizing kitchen items and preparing a simple meal. Therapist educated pt on modifications and safety precautions to take in the kitchen due to L hand sensory and strength deficits including having assistance with cooking to ensure pt safety (monitoring L hand to keep away from heat of the stove), using devices to assist with cutting foods that would avoid her having a knife near L hand, and placing items in organized and easy-to-reach locations to  help with balance and decreasing the amount of times she has to cross her stove to avoid bumping L hand on it (because pt did this 1x during session). Pt/family report no questions/concerns regarding kitchen tasks and appreciative of education. Notified nursing staff of plan to go outside.  Gait training outside ~228ft using rollator including navigating up/down ramps over paved surfaces. Progressed to dynamic gait training outside in paved surfaces, without AD, including up/down ramps that are slanted, stepping on/off curb, navigating 8 stairs with and without HR support (challenged to perform reciprocal stepping pattern in both directions for added challenge but reinforced education on recommendation to perform step-to pattern with family). Reinforced education on use of rollator for community navigation.  Educated pt on BE FAST signs/symptoms of CVA and local stroke support group.  Pt denies any additional questions/concerns.  At end of session, based on discussions with primary therapy team, pt made mod-I in room and nursing staff notified. Pt left seated EOB with her daughter present.   Therapy Documentation Precautions:  Precautions Precautions: Fall Precaution Comments: BP <180/105, L-hemi Restrictions Weight Bearing Restrictions: No   Pain:  No reports of pain throughout session.    Therapy/Group: Individual Therapy  Ginny Forth , PT, DPT, NCS, CSRS 06/25/2023, 3:08 PM

## 2023-06-25 NOTE — Progress Notes (Signed)
Patient ID: Rebekah Mendez, female   DOB: 1960-08-24, 62 y.o.   MRN: [id number] Met with pt to update regarding team conference meeting goals and readiness for discharge tomorrow. She feels ready and will be glad to be at home. See in am for any other questions.

## 2023-06-26 ENCOUNTER — Other Ambulatory Visit (HOSPITAL_COMMUNITY): Payer: Self-pay

## 2023-06-26 MED ORDER — SENNOSIDES-DOCUSATE SODIUM 8.6-50 MG PO TABS: 1.0000 | ORAL_TABLET | Freq: Every day | ORAL | Status: AC

## 2023-06-26 MED ORDER — CLOPIDOGREL BISULFATE 75 MG PO TABS
75.0000 mg | ORAL_TABLET | Freq: Every day | ORAL | 0 refills | Status: AC
  Filled 2023-06-26: qty 10, 10d supply, fill #0

## 2023-06-26 MED ORDER — FLUTICASONE PROPIONATE 50 MCG/ACT NA SUSP: 2.0000 | Freq: Every day | NASAL | Status: DC

## 2023-06-26 MED ORDER — LORATADINE 10 MG PO TABS
10.0000 mg | ORAL_TABLET | Freq: Every day | ORAL | 0 refills | Status: DC | PRN
  Filled 2023-06-26: qty 14, 14d supply, fill #0

## 2023-06-26 MED ORDER — MELATONIN 3 MG PO TABS
3.0000 mg | ORAL_TABLET | Freq: Every day | ORAL | 0 refills | Status: AC
  Filled 2023-06-26: qty 30, 30d supply, fill #0

## 2023-06-26 MED ORDER — ROSUVASTATIN CALCIUM 20 MG PO TABS
20.0000 mg | ORAL_TABLET | Freq: Every day | ORAL | 0 refills | Status: DC
  Filled 2023-06-26: qty 30, 30d supply, fill #0

## 2023-06-26 MED ORDER — AMLODIPINE BESYLATE 10 MG PO TABS
10.0000 mg | ORAL_TABLET | Freq: Every day | ORAL | 0 refills | Status: DC
  Filled 2023-06-26: qty 30, 30d supply, fill #0

## 2023-06-26 MED ORDER — ASPIRIN 81 MG PO TBEC
81.0000 mg | DELAYED_RELEASE_TABLET | Freq: Every day | ORAL | 0 refills | Status: AC
  Filled 2023-06-26: qty 100, 100d supply, fill #0

## 2023-06-26 MED ORDER — LISINOPRIL 2.5 MG PO TABS
2.5000 mg | ORAL_TABLET | Freq: Every day | ORAL | 0 refills | Status: DC
  Filled 2023-06-26: qty 30, 30d supply, fill #0

## 2023-06-26 NOTE — Progress Notes (Addendum)
PROGRESS NOTE   Subjective/Complaints:  No acute events this AM.  Looking forward to going home.   ROS: Patient denies fever, vision changes, chills CP, SOB, N/V/D, constipation,   HA, no new changes in strength or sensation.   Objective:   No results found. No results for input(s): "WBC", "HGB", "HCT", "PLT" in the last 72 hours.  Recent Labs    06/24/23 0501  CREATININE 0.80     Intake/Output Summary (Last 24 hours) at 06/26/2023 0836 Last data filed at 06/25/2023 1813 Gross per 24 hour  Intake 480 ml  Output --  Net 480 ml        Physical Exam: Vital Signs Blood pressure 130/84, pulse 68, temperature 97.8 F (36.6 C), resp. rate 18, height 5' 4.5" (1.638 m), weight 101.1 kg, SpO2 98%.  Physical Exam Vitals and nursing note reviewed.   General: No acute distress, appears comfortable sitting in chair  Appropriate, pleasant this morning Heart: Regular rate and rhythm no rubs murmurs or extra sounds Lungs: Clear to auscultation, breathing unlabored, no rales or wheezes Abdomen: Positive bowel sounds, soft nontender to palpation, nondistended Extremities: No clubbing, cyanosis, or edema Skin: No evidence of breakdown, no evidence of rash  Neuro: Alert and awake,  follows simple commands,  Slight L Facial weakness when smiling  Dysarthria slight L side intact LT sensation in UE and LE  Facial sensation intact bilateral face-upper face tingling is improved   Musculoskeletal:  No joint swelling noted, no ROM deficits noted.    Comments: RUE 5/5 LUE biceps 4/5; Triceps 4/5; WE 4/5; grip 4/5 and FA 3/5 RLE- 5/5 LLE- proximally 4/5 and distally 4+/5 Fine motor - intact finger to thumb opposition , slower on Left than on Right  Exam stable 11/14   Assessment/Plan: 1. Functional deficits which require 3+ hours per day of interdisciplinary therapy in a comprehensive inpatient rehab setting. Physiatrist  is providing close team supervision and 24 hour management of active medical problems listed below. Physiatrist and rehab team continue to assess barriers to discharge/monitor patient progress toward functional and medical goals  Care Tool:  Bathing    Body parts bathed by patient: Left arm, Chest, Abdomen, Front perineal area, Buttocks, Right upper leg, Left upper leg, Left lower leg, Face, Right lower leg, Right arm   Body parts bathed by helper: Right arm     Bathing assist Assist Level: Independent with assistive device     Upper Body Dressing/Undressing Upper body dressing   What is the patient wearing?: Pull over shirt, Bra    Upper body assist Assist Level: Independent with assistive device    Lower Body Dressing/Undressing Lower body dressing      What is the patient wearing?: Underwear/pull up, Pants     Lower body assist Assist for lower body dressing: Independent with assitive device     Toileting Toileting Toileting Activity did not occur (Clothing management and hygiene only): N/A (no void or bm)  Toileting assist Assist for toileting: Independent with assistive device     Transfers Chair/bed transfer  Transfers assist     Chair/bed transfer assist level: Independent     Locomotion Ambulation  Ambulation assist      Assist level: Independent with assistive device Assistive device: No Device Max distance: 150+   Walk 10 feet activity   Assist     Assist level: Independent with assistive device Assistive device: No Device   Walk 50 feet activity   Assist    Assist level: Independent with assistive device Assistive device: No Device    Walk 150 feet activity   Assist Walk 150 feet activity did not occur: Safety/medical concerns  Assist level: Independent with assistive device Assistive device: No Device    Walk 10 feet on uneven surface  activity   Assist Walk 10 feet on uneven surfaces activity did not occur:  Safety/medical concerns   Assist level: Independent with assistive device     Wheelchair     Assist Is the patient using a wheelchair?: No Type of Wheelchair: Manual    Wheelchair assist level: Minimal Assistance - Patient > 75% Max wheelchair distance: 100    Wheelchair 50 feet with 2 turns activity    Assist        Assist Level: Minimal Assistance - Patient > 75%   Wheelchair 150 feet activity     Assist      Assist Level: Moderate Assistance - Patient 50 - 74%   Blood pressure 130/84, pulse 68, temperature 97.8 F (36.6 C), resp. rate 18, height 5' 4.5" (1.638 m), weight 101.1 kg, SpO2 98%.   Medical Problem List and Plan: 1. 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             -patient may  shower             -ELOS/Goals: 7-10 days supervision  -Continue CIR   -Expected discharge 11/14  Resting hand splint ordered- reports she is using this at night  -CT head was ordered 11/11 due to left facial tingling and left orbital pressure-CT scan stable, symptoms have resolved  -Discussed neuro changes to look out for after DC that would necessitate going to ED for evaluation.  -DC home today     2.  Antithrombotics: -DVT/anticoagulation:  Pharmaceutical: Lovenox             -antiplatelet therapy: Aspirin 81 mg daily and Plavix 75 mg daily x 3 weeks then aspirin alone (start date 11/03)   3. Pain Management: Tylenol as needed, Robaxin as needed muscle spasms   4. Mood/Behavior/Sleep: Melatonin 3 mg nightly, Xanax 0.25 mg twice daily as needed- not home medicine that I could tell- wasn't on meds at home at all.              -antipsychotic agents: N/A   5. Neuropsych/cognition: This patient is capable of making decisions on her own behalf.   6. Skin/Wound Care: Routine skin checks  -Monitor swelling site of prior IV infiltration. No signs of infection noted today. Continue warm compress .  Appears to be gradually improving   7. Fluids/Electrolytes/Nutrition: Routine in and outs with follow-up chemistries  -11/14 Cr 0.80- stable   8.  Hypertension.  Norvasc 10 mg daily.  Monitor with increased mobility    06/26/2023    5:06 AM 06/25/2023    7:21 PM 06/25/2023    1:57 PM  Vitals with BMI  Systolic 130 131 269  Diastolic 84 75 61  Pulse 68 72 72    11/8 start lisinopril 2.5mg  daily- monitor effect- BP in good range 11/10  11/13 well  controlled, she reports plan to get BP cuff for home use, I think this would be a good idea, discussed recording BP in log for PCP  11/14 controlled continue current   9.  Hyperlipidemia.  Lipitor   10.  Morbid Obesity.  BMI 38.60.  Dietary follow-up   11.  Tobacco use.  NicoDerm patch- discontinued.  Provide counseling   12.  Constipation.  MiraLAX daily, Senokot S twice daily.  -Will change mirlax to PRN per pt request,  -11/14 denies constipation   13. Needs PCP- hasn't seen physician in 7= years.   16. Prediabetes- educated on diagnosis and that needs to eat carbs in moderation.   -CBG stable 104  17. Dysphagia- improving   -Diet adjusted to D3 thin, will consult SLP  -Continue PPI  -Patient reported she is having some postnasal drip and congestion that might be contributing.  Will start Flonase  -11/14 continue heart healthy thin diet   18.  Dysarthria post stroke- mild cont SLP, improving   LOS: 9 days A FACE TO FACE EVALUATION WAS PERFORMED  Fanny Dance 06/26/2023, 8:36 AM

## 2023-06-26 NOTE — Progress Notes (Addendum)
INPATIENT REHABILITATION DISCHARGE NOTE   Discharge instructions by: Dois Davenport PA  Verbalized understanding: yes  Skin care/Wound care healing? none  Pain:none  IV's:none  Tubes/Drains:none  O2:none  Safety instructions:done  Patient belongings:done  Discharged UE:AVWU  Discharged via:car  Notes:

## 2023-06-26 NOTE — Progress Notes (Signed)
Inpatient Rehabilitation Care Coordinator Discharge Note   Patient Details  Name: Rebekah Mendez MRN: [id number] Date of Birth: [birth year]   Discharge location: HOME WITH HUSBAND AND OTHER FAMILY MEMBERS TO ASSIST  Length of Stay: 9 DAYS  Discharge activity level: MOD/I LEVEL  Home/community participation: ACTIVE  Patient response YQ:MVHQIO Literacy - How often do you need to have someone help you when you read instructions, pamphlets, or other written material from your doctor or pharmacy?: Never  Patient response NG:EXBMWU Isolation - How often do you feel lonely or isolated from those around you?: Never  Services provided included: MD, RD, PT, OT, RN, CM, TR, Pharmacy, Neuropsych, SW  Financial Services:  Field seismologist Utilized: Other (Comment) (SELF PAY-APPLY FOR MEDICAID)    Choices offered to/list presented to: NA  Follow-up services arranged:  Other (Comment) (NO DME NEEDED AND HOME EXERCISE PROGRAM GIVEN TO PATIENT)     PCP SET UP VIA [facility]-DR DEBORAH JOHNSON 11/22 @ 9:30 AM MATCH FOR PRESCRIPTION ASSIST FOR 30 DAYS OF MEDS.       Patient response to transportation need: Is the patient able to respond to transportation needs?: Yes In the past 12 months, has lack of transportation kept you from medical appointments or from getting medications?: No In the past 12 months, has lack of transportation kept you from meetings, work, or from getting things needed for daily living?: No   Patient/Family verbalized understanding of follow-up arrangements:  Yes  Individual responsible for coordination of the follow-up plan: SELF  AND Rebekah Mendez-HUSBAND [phone]  Confirmed correct DME delivered: Lucy Chris 06/26/2023    Comments (or additional information):FAMILY WAS HERE DAILY TO SEE IN THERAPIES. PT DID WELL AND PROGRESSED QUICKLY AND MET GOALS  Summary of Stay    Date/Time Discharge Planning CSW  06/25/23 0840 Home with husband and family members did well here  and reached goals sooner. Has all DME and home exercise program given. Match placed for assistance with prescriptions ready for DC tomorrow RGD  06/18/23 1040 New evaluation today home with husband who is self employed and children may be able to assist some. Pt hopeful to be mod/i. Placed on neuro-psych for her anxiety RGD       Jaydien Panepinto, Lemar Livings

## 2023-06-30 ENCOUNTER — Telehealth: Payer: Self-pay

## 2023-06-30 NOTE — Transitions of Care (Post Inpatient/ED Visit) (Signed)
   06/30/2023  Name: Rebekah Mendez MRN: [id number] DOB: April 26, 1961  Today's TOC FU Call Status: Today's TOC FU Call Status:: Successful TOC FU Call Completed TOC FU Call Complete Date: 06/30/23 Patient's Name and Date of Birth confirmed.  Transition Care Management Follow-up Telephone Call Date of Discharge: 06/26/23 Discharge Facility: [facility]) Type of Discharge: Inpatient Admission Primary Inpatient Discharge Diagnosis:: CVA How have you been since you were released from the hospital?: Better (She is uninsured and is not sure if she quaifies for Medicaid but was in agreement to having a Medicaid eligibility caseworker call her.  Referral sent to caseworkers as discussed) Any questions or concerns?: No  Items Reviewed: Did you receive and understand the discharge instructions provided?: Yes Medications obtained,verified, and reconciled?: Partial Review Completed Reason for Partial Mediation Review: She stated she has all medications and did not have any questions about the med regime Any new allergies since your discharge?: No Dietary orders reviewed?: Yes Type of Diet Ordered:: heart healthy, low sodium Do you have support at home?: Yes Name of Support/Comfort Primary Source: family  Medications Reviewed Today: Medications Reviewed Today   Medications were not reviewed in this encounter     Home Care and Equipment/Supplies: Were Home Health Services Ordered?: No Any new equipment or medical supplies ordered?: No  Functional Questionnaire: Do you need assistance with bathing/showering or dressing?: No Do you need assistance with meal preparation?: Yes (family assists as needed) Do you need assistance with eating?: No Do you have difficulty maintaining continence: No Do you need assistance with getting out of bed/getting out of a chair/moving?: Yes (She has a walker but stated she only uses it when she goes out of the house) Do you have difficulty managing or  taking your medications?: No (She said she has no difficulty taking her meds but her daughter set them up in a pill box for her)  Follow up appointments reviewed: PCP Follow-up appointment confirmed?: Yes Date of PCP follow-up appointment?: 07/04/23 Follow-up Provider: Dr Laural Benes- will need to establish care after [facility] Follow-up appointment confirmed?: Yes Date of Specialist follow-up appointment?: 07/08/23 Follow-Up Specialty Provider:: PMR Do you need transportation to your follow-up appointment?: No Do you understand care options if your condition(s) worsen?: Yes-patient verbalized understanding    SIGNATURE Robyne Peers, RN

## 2023-07-04 ENCOUNTER — Encounter: Payer: Self-pay | Admitting: Internal Medicine

## 2023-07-04 ENCOUNTER — Ambulatory Visit: Payer: Self-pay | Attending: Internal Medicine | Admitting: Internal Medicine

## 2023-07-04 VITALS — BP 124/82 | HR 73 | Temp 98.1°F | Ht 64.0 in | Wt 219.0 lb

## 2023-07-04 DIAGNOSIS — Z1231 Encounter for screening mammogram for malignant neoplasm of breast: Secondary | ICD-10-CM

## 2023-07-04 DIAGNOSIS — L989 Disorder of the skin and subcutaneous tissue, unspecified: Secondary | ICD-10-CM

## 2023-07-04 DIAGNOSIS — Z09 Encounter for follow-up examination after completed treatment for conditions other than malignant neoplasm: Secondary | ICD-10-CM

## 2023-07-04 DIAGNOSIS — Z6837 Body mass index (BMI) 37.0-37.9, adult: Secondary | ICD-10-CM

## 2023-07-04 DIAGNOSIS — Z87891 Personal history of nicotine dependence: Secondary | ICD-10-CM

## 2023-07-04 DIAGNOSIS — Z2821 Immunization not carried out because of patient refusal: Secondary | ICD-10-CM

## 2023-07-04 DIAGNOSIS — Z1211 Encounter for screening for malignant neoplasm of colon: Secondary | ICD-10-CM

## 2023-07-04 DIAGNOSIS — I6930 Unspecified sequelae of cerebral infarction: Secondary | ICD-10-CM

## 2023-07-04 DIAGNOSIS — E66812 Obesity, class 2: Secondary | ICD-10-CM

## 2023-07-04 DIAGNOSIS — I10 Essential (primary) hypertension: Secondary | ICD-10-CM

## 2023-07-04 MED ORDER — ROSUVASTATIN CALCIUM 20 MG PO TABS: 20.0000 mg | ORAL_TABLET | Freq: Every day | ORAL | 1 refills | Status: DC

## 2023-07-04 MED ORDER — LISINOPRIL 2.5 MG PO TABS: 2.5000 mg | ORAL_TABLET | Freq: Every day | ORAL | 1 refills | Status: DC

## 2023-07-04 MED ORDER — AMLODIPINE BESYLATE 10 MG PO TABS: 10.0000 mg | ORAL_TABLET | Freq: Every day | ORAL | 1 refills | Status: DC

## 2023-07-04 NOTE — Progress Notes (Unsigned)
Patient ID: Rebekah Mendez, female    DOB: 1960-11-10  MRN: [id number]  CC: TOC Date of hospitalization: 11/1-12/2022 and 11/5-14/2024 Date of call from case worker 06/30/2023 Hospitalization Follow-up (Hospitalization f/u. Med refill. Nicki Reaper high BP readings /No to flu, Tdap & shingles vax. Yes to mammogram & colonoscopy )   Subjective: Rebekah Mendez is a 62 y.o. female who presents for new hosp f/u/TOC visit. Husband, Dorene Sorrow, is with her. Her concerns today include:  Patient with history of tobacco dependence, HTN, HL, right basal ganglia CVA 06/2023  Pt presents as a new TOC visit.  She was hosp with new onset slurred speech, LT sided weakness and significant elev BP.  Found to have acute ischemic nonhemorrhagic type infarct involving the posterior right basal ganglia and small remote logic lacunar infarct of the right lentiform nucleus found on MRI. Echo revealed EF 70-75% with no RWMA. LDL 111, A1C 5.6.  Placed on low dose ASA and Plavix.  Plan to stop Plavix after 3 wks and continue ASA.  Sent to in-pt rehab 11/5-14/2024.  Today: Patient reports she has some residual weakness left arm particularly the grip.  Strength in left leg close to baseline.  Has little residual numbness on the left side.  She has a rollator seated walker with her today.  She uses this when she leaves the house but does not have to use it inside the house.  Patient and spouse reports that her speech is almost completely back to baseline. No problems swallowing -Compliant with aspirin and Plavix.  Has a few days left off of the Plavix after which she will continue aspirin alone. -Compliant with taking lisinopril 2.5 mg daily and Norvasc 10 mg daily for blood pressure.  Has been checking blood pressure with arm device.  Some of her recent readings are 135/80, 124/71, 153/92, 136/84, 123/84, 120/80 128/85.  Yesterday blood pressure was elevated 178/103.  Subsequent repeat later on was 136/78. -No CP/SOB/LE  edema -Use to smoke socially.  She has quit. -noted today to have what looks like a small skin horn on the tip of nose. Reports it has been there a while but has increased in size.  Pt uninsured.  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  HM: She declines Shingrix, flu and tdapt vaccines.  Overdue for mammogram, colon cancer screening and Pap smear.  No family history of colon cancer but sister has history of colon polyps. Patient Active Problem List   Diagnosis Date Noted   History of cerebrovascular accident (CVA) with residual deficit 07/06/2023   Lesion of skin of nose 07/06/2023   Coping style affecting medical condition 06/24/2023   Essential hypertension 06/17/2023   Tobacco use disorder 06/17/2023   Obesity 06/17/2023   Cerebrovascular accident (CVA) of right basal ganglia (HCC) 06/17/2023   Infarction of right basal ganglia (HCC) 06/13/2023     Current Outpatient Medications on File Prior to Visit  Medication Sig Dispense Refill   aspirin EC 81 MG tablet Take 1 tablet (81 mg total) by mouth daily. Swallow whole. 100 tablet 0   clopidogrel (PLAVIX) 75 MG tablet Take 1 tablet (75 mg total) by mouth daily for 10 days. 10 tablet 0   fluticasone (FLONASE) 50 MCG/ACT nasal spray Place 2 sprays into both nostrils daily.     loratadine (CLARITIN) 10 MG tablet Take 1 tablet (10 mg total) by mouth daily as needed for allergies. 14 tablet  0   melatonin 3 MG TABS tablet Take 1 tablet (3 mg total) by mouth at bedtime. 30 tablet 0   senna-docusate (SENOKOT-S) 8.6-50 MG tablet Take 1 tablet by mouth at bedtime.     No current facility-administered medications on file prior to visit.    No Known Allergies  Social History   Socioeconomic History   Marital status: Married    Spouse name: Not on file   Number of children: Not on file   Years of education: Not on file   Highest education level: Not on file  Occupational History   Not on  file  Tobacco Use   Smoking status: Some Days   Smokeless tobacco: Never  Substance and Sexual Activity   Alcohol use: Yes    Comment: rare   Drug use: No   Sexual activity: Not on file  Other Topics Concern   Not on file  Social History Narrative   Not on file   Social Determinants of Health   Financial Resource Strain: Not on file  Food Insecurity: No Food Insecurity (06/15/2023)   Hunger Vital Sign    Worried About Running Out of Food in the Last Year: Never true    Ran Out of Food in the Last Year: Never true  Transportation Needs: No Transportation Needs (06/15/2023)   PRAPARE - Administrator, Civil Service (Medical): No    Lack of Transportation (Non-Medical): No  Physical Activity: Not on file  Stress: Not on file  Social Connections: Not on file  Intimate Partner Violence: Not At Risk (06/15/2023)   Humiliation, Afraid, Rape, and Kick questionnaire    Fear of Current or Ex-Partner: No    Emotionally Abused: No    Physically Abused: No    Sexually Abused: No    Family History  Problem Relation Age of Onset   Colon cancer Neg Hx     Past Surgical History:  Procedure Laterality Date   CESAREAN SECTION     Feb '89   EXCISIONAL HEMORRHOIDECTOMY      ROS: Review of Systems Negative except as stated above  PHYSICAL EXAM: BP 124/82   Pulse 73   Temp 98.1 F (36.7 C) (Oral)   Ht 5\' 4"  (1.626 m)   Wt 219 lb (99.3 kg)   SpO2 97%   BMI 37.59 kg/m   Wt Readings from Last 3 Encounters:  07/04/23 219 lb (99.3 kg)  06/18/23 222 lb 14.2 oz (101.1 kg)  06/14/23 228 lb 6.3 oz (103.6 kg)    Physical Exam  General appearance - alert, well appearing, older caucasian female and in no distress Mental status - normal mood, behavior, speech, dress, and thought processes Chest - clear to auscultation, no wheezes, rales or rhonchi, symmetric air entry Heart - normal rate, regular rhythm, normal S1, S2, no murmurs, rubs, clicks or gallops Neurological -  grip 5/5RT, 4/5 LT.  Power UE prox and distal 5/5.  Power in LE BL 5/5.  Gait is deliberately slow with low foot to floor clearance. Extremities - no LE edema Skin: 2 mm horn lesion noted at the tip of nose.     Latest Ref Rng & Units 06/24/2023    5:01 AM 06/18/2023    5:18 AM 06/17/2023    4:52 AM  CMP  Glucose 70 - 99 mg/dL  098  119   BUN 6 - 20 mg/dL  14  14   Creatinine 1.47 - 1.00 mg/dL 8.29  5.62  0.64   Sodium 135 - 145 mmol/L  139  138   Potassium 3.5 - 5.1 mmol/L  3.7  3.5   Chloride 98 - 111 mmol/L  105  106   CO2 22 - 32 mmol/L  24  23   Calcium 8.9 - 10.3 mg/dL  8.7  8.5   Total Protein 6.5 - 8.1 g/dL  6.2    Total Bilirubin <1.2 mg/dL  0.5    Alkaline Phos 38 - 126 U/L  61    AST 15 - 41 U/L  30    ALT 0 - 44 U/L  26     Lipid Panel     Component Value Date/Time   CHOL 178 06/14/2023 0220   TRIG 78 06/14/2023 0220   HDL 51 06/14/2023 0220   CHOLHDL 3.5 06/14/2023 0220   VLDL 16 06/14/2023 0220   LDLCALC 111 (H) 06/14/2023 0220    CBC    Component Value Date/Time   WBC 6.3 06/18/2023 0518   RBC 4.46 06/18/2023 0518   HGB 12.2 06/18/2023 0518   HCT 38.0 06/18/2023 0518   PLT 256 06/18/2023 0518   MCV 85.2 06/18/2023 0518   MCH 27.4 06/18/2023 0518   MCHC 32.1 06/18/2023 0518   RDW 12.9 06/18/2023 0518   LYMPHSABS 2.2 06/18/2023 0518   MONOABS 0.5 06/18/2023 0518   EOSABS 0.2 06/18/2023 0518   BASOSABS 0.1 06/18/2023 0518    ASSESSMENT AND PLAN: 1. Hospital discharge follow-up   2. History of cerebrovascular accident (CVA) with residual deficit Complete the 3 wks course of Plavix then stop.  Plan is to continue aspirin indefinitely. -Need for good blood pressure control discussed.  Repeat blood pressure today.  Continue lisinopril 2.5 mg and amlodipine 10 mg daily for now.  Continue to monitor blood pressure with goal being 130/80 or lower. -Continue Crestor. -Commended her on quitting smoking.  Advised to remain tobacco free. -Encouraged her to  apply for Medicaid advised to go to the Medicaid office on the fourth floor of our building to see whether she qualifies or not.  Also informed her how to apply for the cone discount/orange card in the event she does not qualify for Medicaid. - rosuvastatin (CRESTOR) 20 MG tablet; Take 1 tablet (20 mg total) by mouth daily.  Dispense: 90 tablet; Refill: 1  3. Essential hypertension See #2 above. - amLODipine (NORVASC) 10 MG tablet; Take 1 tablet (10 mg total) by mouth daily.  Dispense: 90 tablet; Refill: 1 - lisinopril (ZESTRIL) 2.5 MG tablet; Take 1 tablet (2.5 mg total) by mouth daily.  Dispense: 90 tablet; Refill: 1  4. Former smoker See #2 above.  5. Class 2 severe obesity due to excess calories with serious comorbidity and body mass index (BMI) of 37.0 to 37.9 in adult [facility]) Patient advised to eliminate sugary drinks from the diet, cut back on portion sizes especially of white carbohydrates, eat more white lean meat like chicken [place] and seafood instead of beef or pork and incorporate fresh fruits and vegetables into the diet daily.   6. Lesion of skin of nose Once she has insurance, we can refer to dermatology to have this removed.  7. Influenza vaccination declined Recommended.  Patient declined  8. Tetanus, diphtheria, and acellular pertussis (Tdap) vaccination declined   9. Encounter for screening mammogram for malignant neoplasm of breast Will provide mammogram scholarship. - MM Digital Screening; Future  10. Screening for colon cancer - Fecal occult blood, imunochemical([facility])  Discussed doing Pap  smear on one of her subsequent visits.  Patient was given the opportunity to ask questions.  Patient verbalized understanding of the plan and was able to repeat key elements of the plan.   This documentation was completed using Paediatric nurse.  Any transcriptional errors are unintentional.  Orders Placed This Encounter  Procedures   Fecal  occult blood, imunochemical([facility])   MM Digital Screening     Requested Prescriptions   Signed Prescriptions Disp Refills   amLODipine (NORVASC) 10 MG tablet 90 tablet 1    Sig: Take 1 tablet (10 mg total) by mouth daily.   lisinopril (ZESTRIL) 2.5 MG tablet 90 tablet 1    Sig: Take 1 tablet (2.5 mg total) by mouth daily.   rosuvastatin (CRESTOR) 20 MG tablet 90 tablet 1    Sig: Take 1 tablet (20 mg total) by mouth daily.    Return in about 4 months (around 11/01/2023).  Jonah Blue, MD, FACP

## 2023-07-04 NOTE — Patient Instructions (Signed)
Continue to monitor your blood pressure.  Goal is 130/80 or lower.  If blood pressure is consistently higher than this for 2 to 3 days, please send me a MyChart message so that we can increase the lisinopril.  Healthy Eating, Adult Healthy eating may help you get and keep a healthy body weight, reduce the risk of chronic disease, and live a long and productive life. It is important to follow a healthy eating pattern. Your nutritional and calorie needs should be met mainly by different nutrient-rich foods. What are tips for following this plan? Reading food labels Read labels and choose the following: Reduced or low sodium products. Juices with 100% fruit juice. Foods with low saturated fats (<3 g per serving) and high polyunsaturated and monounsaturated fats. Foods with whole grains, such as whole wheat, cracked wheat, brown rice, and wild rice. Whole grains that are fortified with folic acid. This is recommended for females who are pregnant or who want to become pregnant. Read labels and do not eat or drink the following: Foods or drinks with added sugars. These include foods that contain brown sugar, corn sweetener, corn syrup, dextrose, fructose, glucose, high-fructose corn syrup, honey, invert sugar, lactose, malt syrup, maltose, molasses, raw sugar, sucrose, trehalose, or turbinado sugar. Limit your intake of added sugars to less than 10% of your total daily calories. Do not eat more than the following amounts of added sugar per day: 6 teaspoons (25 g) for females. 9 teaspoons (38 g) for males. Foods that contain processed or refined starches and grains. Refined grain products, such as white flour, degermed cornmeal, white bread, and white rice. Shopping Choose nutrient-rich snacks, such as vegetables, whole fruits, and nuts. Avoid high-calorie and high-sugar snacks, such as potato chips, fruit snacks, and candy. Use oil-based dressings and spreads on foods instead of solid fats such as  butter, margarine, sour cream, or cream cheese. Limit pre-made sauces, mixes, and "instant" products such as flavored rice, instant noodles, and ready-made pasta. Try more plant-protein sources, such as tofu, tempeh, black beans, edamame, lentils, nuts, and seeds. Explore eating plans such as the Mediterranean diet or vegetarian diet. Try heart-healthy dips made with beans and healthy fats like hummus and guacamole. Vegetables go great with these. Cooking Use oil to saut or stir-fry foods instead of solid fats such as butter, margarine, or lard. Try baking, boiling, grilling, or broiling instead of frying. Remove the fatty part of meats before cooking. Steam vegetables in water or broth. Meal planning  At meals, imagine dividing your plate into fourths: One-half of your plate is fruits and vegetables. One-fourth of your plate is whole grains. One-fourth of your plate is protein, especially lean meats, poultry, eggs, tofu, beans, or nuts. Include low-fat dairy as part of your daily diet. Lifestyle Choose healthy options in all settings, including home, work, school, restaurants, or stores. Prepare your food safely: Wash your hands after handling raw meats. Where you prepare food, keep surfaces clean by regularly washing with hot, soapy water. Keep raw meats separate from ready-to-eat foods, such as fruits and vegetables. Cook seafood, meat, poultry, and eggs to the recommended temperature. Get a food thermometer. Store foods at safe temperatures. In general: Keep cold foods at 36F (4.4C) or below. Keep hot foods at 136F (60C) or above. Keep your freezer at [facility] (-17.8C) or below. Foods are not safe to eat if they have been between the temperatures of 40-136F (4.4-60C) for more than 2 hours. What foods should I eat? Fruits Aim to  eat 1-2 cups of fresh, canned (in natural juice), or frozen fruits each day. One cup of fruit equals 1 small apple, 1 large banana, 8 large  strawberries, 1 cup (237 g) canned fruit,  cup (82 g) dried fruit, or 1 cup (240 mL) 100% juice. Vegetables Aim to eat 2-4 cups of fresh and frozen vegetables each day, including different varieties and colors. One cup of vegetables equals 1 cup (91 g) broccoli or cauliflower florets, 2 medium carrots, 2 cups (150 g) raw, leafy greens, 1 large tomato, 1 large bell pepper, 1 large sweet potato, or 1 medium white potato. Grains Aim to eat 5-10 ounce-equivalents of whole grains each day. Examples of 1 ounce-equivalent of grains include 1 slice of bread, 1 cup (40 g) ready-to-eat cereal, 3 cups (24 g) popcorn, or  cup (93 g) cooked rice. Meats and other proteins Try to eat 5-7 ounce-equivalents of protein each day. Examples of 1 ounce-equivalent of protein include 1 egg,  oz nuts (12 almonds, 24 pistachios, or 7 walnut halves), 1/4 cup (90 g) cooked beans, 6 tablespoons (90 g) hummus or 1 tablespoon (16 g) peanut butter. A cut of meat or fish that is the size of a deck of cards is about 3-4 ounce-equivalents (85 g). Of the protein you eat each week, try to have at least 8 sounce (227 g) of seafood. This is about 2 servings per week. This includes salmon, trout, herring, sardines, and anchovies. Dairy Aim to eat 3 cup-equivalents of fat-free or low-fat dairy each day. Examples of 1 cup-equivalent of dairy include 1 cup (240 mL) milk, 8 ounces (250 g) yogurt, 1 ounces (44 g) natural cheese, or 1 cup (240 mL) fortified soy milk. Fats and oils Aim for about 5 teaspoons (21 g) of fats and oils per day. Choose monounsaturated fats, such as canola and olive oils, mayonnaise made with olive oil or avocado oil, avocados, peanut butter, and most nuts, or polyunsaturated fats, such as sunflower, corn, and soybean oils, walnuts, pine nuts, sesame seeds, sunflower seeds, and flaxseed. Beverages Aim for 6 eight-ounce glasses of water per day. Limit coffee to 3-5 eight-ounce cups per day. Limit caffeinated beverages  that have added calories, such as soda and energy drinks. If you drink alcohol: Limit how much you have to: 0-1 drink a day if you are female. 0-2 drinks a day if you are female. Know how much alcohol is in your drink. In the [place] one drink is one 12 oz bottle of beer (355 mL), one 5 oz glass of wine (148 mL), or one 1 oz glass of hard liquor (44 mL). Seasoning and other foods Try not to add too much salt to your food. Try using herbs and spices instead of salt. Try not to add sugar to food. This information is based on [place] nutrition guidelines. To learn more, visit DisposableNylon.be. Exact amounts may vary. You may need different amounts. This information is not intended to replace advice given to you by your health care provider. Make sure you discuss any questions you have with your health care provider. Document Revised: 04/29/2022 Document Reviewed: 04/29/2022 Elsevier Patient Education  2024 [employer].

## 2023-07-05 LAB — FECAL OCCULT BLOOD, IMMUNOCHEMICAL: Fecal Occult Bld: NEGATIVE

## 2023-07-06 DIAGNOSIS — L989 Disorder of the skin and subcutaneous tissue, unspecified: Secondary | ICD-10-CM | POA: Insufficient documentation

## 2023-07-06 DIAGNOSIS — I6930 Unspecified sequelae of cerebral infarction: Secondary | ICD-10-CM | POA: Insufficient documentation

## 2023-07-08 ENCOUNTER — Encounter: Payer: Self-pay | Attending: Physical Medicine & Rehabilitation | Admitting: Physical Medicine & Rehabilitation

## 2023-07-08 ENCOUNTER — Encounter: Payer: Self-pay | Admitting: Physical Medicine & Rehabilitation

## 2023-07-08 VITALS — BP 120/78 | HR 73 | Ht 64.0 in | Wt 218.0 lb

## 2023-07-08 DIAGNOSIS — K5900 Constipation, unspecified: Secondary | ICD-10-CM | POA: Insufficient documentation

## 2023-07-08 DIAGNOSIS — I10 Essential (primary) hypertension: Secondary | ICD-10-CM | POA: Insufficient documentation

## 2023-07-08 DIAGNOSIS — M25552 Pain in left hip: Secondary | ICD-10-CM | POA: Insufficient documentation

## 2023-07-08 DIAGNOSIS — I6381 Other cerebral infarction due to occlusion or stenosis of small artery: Secondary | ICD-10-CM | POA: Insufficient documentation

## 2023-07-08 DIAGNOSIS — G4700 Insomnia, unspecified: Secondary | ICD-10-CM | POA: Insufficient documentation

## 2023-07-08 NOTE — Progress Notes (Signed)
Subjective:    Patient ID: Rebekah Mendez, female    DOB: 1961-03-11, 62 y.o.   MRN: [id number]  HPI   07/08/2023  Patient is here for follow-up after completing treatment at [facility] after having a CVA s/p TNK.  Patient reports he is doing well overall.  Denies any significant pain although reports some occasional discomfort in her left anterior hip with ambulation.  She says this is not severe enough to describe as pain.  She is not using medications regularly for this issue.  She has been seen by PCP since discharge.  She also has follow-up with neurology scheduled next month.  Reports sleeping okay overall but using melatonin for sleep.  She is ambulating with a walker, denies any falls.  Reports she is eating her regular diet without any significant issues at this time.  Reports having regular bowel movements, using laxative occasionally.   Pain Inventory Average Pain 4 Pain Right Now 0 My pain is intermittent and tightness  LOCATION OF PAIN  pelvic  BOWEL Number of stools per week: 7 Oral laxative use No  Type of laxative . Enema or suppository use No  History of colostomy No  Incontinent No   BLADDER Pads In and out cath, frequency . Able to self cath  . Bladder incontinence No  Frequent urination No  Leakage with coughing No  Difficulty starting stream No  Incomplete bladder emptying Yes    Mobility walk with assistance use a walker how many minutes can you walk? 10 ability to climb steps?  yes do you drive?  no  Function employed # of hrs/week .  Neuro/Psych bladder control problems numbness trouble walking  Prior Studies Hospital f/u  Physicians involved in your care Hospital f/u   Family History  Problem Relation Age of Onset   Colon cancer Neg Hx    Social History   Socioeconomic History   Marital status: Married    Spouse name: Not on file   Number of children: Not on file   Years of education: Not on file   Highest education  level: Not on file  Occupational History   Not on file  Tobacco Use   Smoking status: Some Days   Smokeless tobacco: Never  Substance and Sexual Activity   Alcohol use: Yes    Comment: rare   Drug use: No   Sexual activity: Not on file  Other Topics Concern   Not on file  Social History Narrative   Not on file   Social Determinants of Health   Financial Resource Strain: Not on file  Food Insecurity: No Food Insecurity (06/15/2023)   Hunger Vital Sign    Worried About Running Out of Food in the Last Year: Never true    Ran Out of Food in the Last Year: Never true  Transportation Needs: No Transportation Needs (06/15/2023)   PRAPARE - Administrator, Civil Service (Medical): No    Lack of Transportation (Non-Medical): No  Physical Activity: Not on file  Stress: Not on file  Social Connections: Not on file   Past Surgical History:  Procedure Laterality Date   CESAREAN SECTION     Feb '89   EXCISIONAL HEMORRHOIDECTOMY     Past Medical History:  Diagnosis Date   OSA (obstructive sleep apnea)    Thyroid activity decreased    late 20's   BP 120/78   Pulse 73   Ht 5\' 4"  (1.626 m)   Wt 218 lb (  98.9 kg)   SpO2 95%   BMI 37.42 kg/m   Opioid Risk Score:   Fall Risk Score:  `1  Depression screen PHQ 2/9      No data to display           Review of Systems  Genitourinary:  Positive for pelvic pain.  Musculoskeletal:  Positive for gait problem.  Neurological:  Positive for numbness.  All other systems reviewed and are negative.     Objective:   Physical Exam    07/08/2023   11:36 AM 07/04/2023   10:39 AM 07/04/2023    9:35 AM  Vitals with BMI  Height 5\' 4"   5\' 4"   Weight 218 lbs  219 lbs  BMI 37.4  37.57  Systolic 120 124 914  Diastolic 78 82 90  Pulse 73  73    Gen: no distress, normal appearing HEENT: oral mucosa pink and moist, NCAT Chest: normal effort, normal rate of breathing Abd: soft, non-distended Ext: no edema Psych:  pleasant, normal affect Skin: intact Neuro: Alert and awake, follows commands, cranial nerves II through XII grossly intact, normal speech and language RUE: 5 out of 5 throughout LUE: 4+ throughout left upper extremity RLE: 5 out of 5 LLE: 4+ out of 5 proximally, 5 out of 5 distally Sensory exam normal for light touch and pain in all 4 limbs.  Finger-nose intact bilaterally.  No abnormal tone appreciated.   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             -Hand splint -I think she can discontinue use of this device             -Continue follow-up with neurology as directed  -Continue ASA   -Temporary handicap parking placard form provided  -I think she should hold off on driving for the time being however could likely return to this activity soon.  Graduated return to driving instructions were provided. It is recommended that the patient first drives with another licensed driver in an empty parking lot. If the patient does well with this, and they can drive on a quiet street with the licensed driver. If the patient does well with this they can drive on a busy street with a licensed driver. If the patient does well with this, the next time out they can go by himself. For the first month after resuming driving, I recommend no nighttime or Interstate driving.   2.  Mild L hip pain -Tylenol PRN, f/u with worsens and we can explore other options   3.  Hypertension.  On Norvasc 10 mg daily and Lisinopril 2.5mg .  Well-controlled today -F/u with PCP   4.  Constipation.   -Improved, reports intermittent use of senna?  Provided list of foods that can be helpful.  Consider trying MiraLAX.  I think use of senna as okay however as it is a stimulating laxative can result in some tolerance to  the medication over time   5. Dysphagia  -Reports tolerating regular diet without any difficulty, no coughing  6.  Insomnia  -I think it is okay if she continues to use melatonin.  However if she works on sleep hygiene she may be able to wean off this medication

## 2023-07-17 ENCOUNTER — Telehealth: Payer: Self-pay

## 2023-07-17 NOTE — Telephone Encounter (Signed)
Telephoned patient at mobile number. Patient ineligible for mammogram scholarship after completing screening process.

## 2023-07-17 NOTE — Telephone Encounter (Signed)
Telephoned patient at mobile number. Left a voice message with [facility] (scholarship) contact information. 

## 2023-07-27 NOTE — Progress Notes (Unsigned)
PATIENT: Rebekah Mendez DOB: 12-13-1960  REASON FOR VISIT: follow up HISTORY FROM: patient PRIMARY NEUROLOGIST:   HISTORY OF PRESENT ILLNESS: Today 07/27/23  Rebekah Mendez is a 62 y.o. female who has been followed in this office for ***. Returns today for follow-up.   HISTORY [name] is a  62 year old woman with no significant past medical history because she has not seen a physician presented to the emergency department for evaluation of sudden onset of left-sided face arm and leg weakness along with decreased sensation on the left and some slurred speech.   She was having dinner around 6:30 PM when she had sudden onset of left-sided numbness and weakness. CODE STROKE activated at [facility]. BP 225 systolic on arrival. Patient was reportedly also very anxious on arrival. NIH: 4. After BP controlled, TNK was given at 2007.   MRI shows R basal ganglia infarct.     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 ASPECTS 10.    CTA head & neck  No LVO or significant stenosis Beading of bilateral mid ICAs, often seen in fibromuscular dysplasia   MRI   1.6cm acute ischemic non-hemorrhagic posterior right basal ganglia infarct Small remote hemorrhagic lacunar infarct right lentiform nucleus   2D Echo: EF 70-75%. LV hyperdynamic and no wall motion abnormalities.  Carotid US: Non significant plaque noted in mid to distal R ICA and L ICA.  No evidence of stenosis.   TCD w/Bubble Study: Negative  [place] LE DVT: No evidence of DVT  ANA labs: outptaient follow up LDL 111 HgbA1c 5.6   VTE prophylaxis - SCDs and Lovenox  No hemorrhage on CT  Continue ASA and plavix for 3 weeks, followed by aspirin monotherapy.  Therapy recommendations:  CIR, awaiting full assessment  OOB as  tolerable and ambulation  Disposition:  CIR    REVIEW OF SYSTEMS: Out of a complete 14 system review of symptoms, the patient complains only of the following symptoms, and all other reviewed systems are negative.  ALLERGIES: No Known Allergies  HOME MEDICATIONS: Outpatient Medications Prior to Visit  Medication Sig Dispense Refill   amLODipine (NORVASC) 10 MG tablet Take 1 tablet (10 mg total) by mouth daily. 90 tablet 1   aspirin EC 81 MG tablet Take 1 tablet (81 mg total) by mouth daily. Swallow whole. 100 tablet 0   fluticasone (FLONASE) 50 MCG/ACT nasal spray Place 2 sprays into both nostrils daily.     lisinopril (ZESTRIL) 2.5 MG tablet Take 1 tablet (2.5 mg total) by mouth daily. 90 tablet 1   loratadine (CLARITIN) 10 MG tablet Take 1 tablet (10 mg total) by mouth daily as needed for allergies. 14 tablet 0   melatonin 3 MG TABS tablet Take 1 tablet (3 mg total) by mouth at bedtime. 30 tablet 0   rosuvastatin (CRESTOR) 20 MG tablet Take 1 tablet (20 mg total) by mouth daily. 90 tablet 1   senna-docusate (SENOKOT-S) 8.6-50 MG tablet Take 1 tablet by mouth at bedtime.     No facility-administered medications prior to visit.    PAST MEDICAL HISTORY: Past Medical History:  Diagnosis Date   OSA (obstructive sleep apnea)    Thyroid activity decreased    late 20's    PAST SURGICAL HISTORY: Past Surgical History:  Procedure Laterality Date  CESAREAN SECTION     Feb '89   EXCISIONAL HEMORRHOIDECTOMY      FAMILY HISTORY: Family History  Problem Relation Age of Onset   Colon cancer Neg Hx     SOCIAL HISTORY: Social History   Socioeconomic History   Marital status: Married    Spouse name: Not on file   Number of children: Not on file   Years of education: Not on file   Highest education level: Not on file  Occupational History   Not on file  Tobacco Use   Smoking status: Some Days   Smokeless tobacco: Never  Substance and Sexual Activity   Alcohol use: Yes     Comment: rare   Drug use: No   Sexual activity: Not on file  Other Topics Concern   Not on file  Social History Narrative   Not on file   Social Drivers of Health   Financial Resource Strain: Not on file  Food Insecurity: No Food Insecurity (06/15/2023)   Hunger Vital Sign    Worried About Running Out of Food in the Last Year: Never true    Ran Out of Food in the Last Year: Never true  Transportation Needs: No Transportation Needs (06/15/2023)   PRAPARE - Administrator, Civil Service (Medical): No    Lack of Transportation (Non-Medical): No  Physical Activity: Not on file  Stress: Not on file  Social Connections: Not on file  Intimate Partner Violence: Not At Risk (06/15/2023)   Humiliation, Afraid, Rape, and Kick questionnaire    Fear of Current or Ex-Partner: No    Emotionally Abused: No    Physically Abused: No    Sexually Abused: No      PHYSICAL EXAM  There were no vitals filed for this visit. There is no height or weight on file to calculate BMI.  Generalized: Well developed, in no acute distress   Neurological examination  Mentation: Alert oriented to time, place, history taking. Follows all commands speech and language fluent Cranial nerve II-XII: Pupils were equal round reactive to light. Extraocular movements were full, visual field were full on confrontational test. Facial sensation and strength were normal. Uvula tongue midline. Head turning and shoulder shrug  were normal and symmetric. Motor: The motor testing reveals 5 over 5 strength of all 4 extremities. Good symmetric motor tone is noted throughout.  Sensory: Sensory testing is intact to soft touch on all 4 extremities. No evidence of extinction is noted.  Coordination: Cerebellar testing reveals good finger-nose-finger and heel-to-shin bilaterally.  Gait and station: Gait is normal. Tandem gait is normal. Romberg is negative. No drift is seen.  Reflexes: Deep tendon reflexes are symmetric and  normal bilaterally.   DIAGNOSTIC DATA (LABS, IMAGING, TESTING) - I reviewed patient records, labs, notes, testing and imaging myself where available.  Lab Results  Component Value Date   WBC 6.3 06/18/2023   HGB 12.2 06/18/2023   HCT 38.0 06/18/2023   MCV 85.2 06/18/2023   PLT 256 06/18/2023      Component Value Date/Time   NA 139 06/18/2023 0518   K 3.7 06/18/2023 0518   CL 105 06/18/2023 0518   CO2 24 06/18/2023 0518   GLUCOSE 104 (H) 06/18/2023 0518   BUN 14 06/18/2023 0518   CREATININE 0.80 06/24/2023 0501   CALCIUM 8.7 (L) 06/18/2023 0518   PROT 6.2 (L) 06/18/2023 0518   ALBUMIN 3.1 (L) 06/18/2023 0518   AST 30 06/18/2023 0518   ALT 26 06/18/2023  0518   ALKPHOS 61 06/18/2023 0518   BILITOT 0.5 06/18/2023 0518   GFRNONAA >60 06/24/2023 0501   Lab Results  Component Value Date   CHOL 178 06/14/2023   HDL 51 06/14/2023   LDLCALC 111 (H) 06/14/2023   TRIG 78 06/14/2023   CHOLHDL 3.5 06/14/2023   Lab Results  Component Value Date   HGBA1C 5.6 06/14/2023   No results found for: "VITAMINB12" No results found for: "TSH"    ASSESSMENT AND PLAN 62 y.o. year old female  has a past medical history of OSA (obstructive sleep apnea) and Thyroid activity decreased. here with:    *** : Residual deficit: ***. Continue aspirin 81 mg daily  and ***  for secondary stroke prevention.  Discussed secondary stroke prevention measures and importance of close PCP follow up for aggressive stroke risk factor management. I have gone over the pathophysiology of stroke, warning signs and symptoms, risk factors and their management in some detail with instructions to go to the closest emergency room for symptoms of concern. HTN: BP goal <130/90.  Stable on *** per PCP HLD: LDL goal <70. Recent LDL 111. On crestor 20 mg DMII: A1c goal<7.0. Recent A1c 5.6.  Encouraged patient to monitor diet and encouraged exercise FU with our office ***      Butch Penny, MSN, NP-C 07/27/2023,  3:16 PM [facility] [address], [address] [place], [place] [address] [phone]

## 2023-07-28 ENCOUNTER — Inpatient Hospital Stay: Payer: Self-pay | Admitting: Adult Health

## 2023-07-28 ENCOUNTER — Telehealth: Payer: Self-pay | Admitting: Adult Health

## 2023-07-28 NOTE — Telephone Encounter (Signed)
The patient can see any NP. Can we reschedule her to a sooner availability, anything this week? We are unable to address driving without seeing her first.

## 2023-07-28 NOTE — Telephone Encounter (Signed)
Called pt to reschedule appt with Butch Penny. Pt states she doesn't know if she should even be seen if she is going to have to wait this long for an appointment. She would like to know if she is able to be driving and would like a call from someone in the office.

## 2023-08-27 NOTE — Progress Notes (Signed)
PATIENT: Rebekah Mendez DOB: [birth year]  REASON FOR VISIT: follow up HISTORY FROM: patient PRIMARY NEUROLOGIST: Dr. Pearlean Brownie  Chief Complaint  Patient presents with   Cerebrovascular Accident    RM 19 with spouse  Pt is well, reports she has gets tired easily and has no stamina. She is also having weakness and fine motor concerns in L arm.  Speech is normal.      HISTORY OF PRESENT ILLNESS: Today 08/27/23  Rebekah Mendez is a 63 y.o. female with history of right basal ganglia stroke.  Here for hospital follow-up.  She states overall her symptoms have pretty much resolved.  She does note some weakness in the left hand.  She did do PT and OT after hospitalization.  She remains on aspirin daily.  She is on Crestor for her cholesterol.  Hemoglobin A1c has been in normal range.  She is checking her blood pressure regularly.  She is on Norvasc and lisinopril.  Fortunately she has not had any additional strokelike symptoms.  She is traveling to [place] in February questioning if this is okay to do that after stroke.  Patient denies any changes in her mood or behavior.  Patient did do sleep study trial in the hospital.  It was determined that she does have sleep apnea and she was given a CPAP machine.  She reports that she has been using it with good benefit.  HISTORY [name] is a  63 year old woman with no significant past medical history because she has not seen a physician presented to the emergency department for evaluation of sudden onset of left-sided face arm and leg weakness along with decreased sensation on the left and some slurred speech.   She was having dinner around 6:30 PM when she had sudden onset of left-sided numbness and weakness. CODE STROKE activated at [facility]. BP 225 systolic on arrival. Patient was reportedly also very anxious on arrival. NIH: 4. After BP controlled, TNK was given at 2007.   MRI shows R basal ganglia infarct.     HOSPITAL  COURSE Acute Ischemic Infarct:  right basal ganglia s/p TNK -acute infarct is adjacent to old hemorrhagic lacunar infarct in the same region Etiology:  pending full stroke work-up  Code Stroke CT head No acute abnormality. ASPECTS 10.    CTA head & neck  No LVO or significant stenosis Beading of bilateral mid ICAs, often seen in fibromuscular dysplasia   MRI   1.6cm acute ischemic non-hemorrhagic posterior right basal ganglia infarct Small remote hemorrhagic lacunar infarct right lentiform nucleus   2D Echo: EF 70-75%. LV hyperdynamic and no wall motion abnormalities.  Carotid US: Non significant plaque noted in mid to distal R ICA and L ICA.  No evidence of stenosis.   TCD w/Bubble Study: Negative  [place] LE DVT: No evidence of DVT  ANA labs: outptaient follow up LDL 111 HgbA1c 5.6   VTE prophylaxis - SCDs and Lovenox  No hemorrhage on CT  Continue ASA and plavix for 3 weeks, followed by aspirin monotherapy.  Therapy recommendations:  CIR, awaiting full assessment  OOB as tolerable and ambulation  Disposition:  CIR    REVIEW OF SYSTEMS: Out of a complete 14 system review of symptoms, the patient complains only of the following symptoms, and all other reviewed systems are negative.  ALLERGIES: No Known Allergies  HOME MEDICATIONS: Outpatient Medications Prior to Visit  Medication Sig Dispense Refill   amLODipine (NORVASC) 10 MG tablet Take 1 tablet (  10 mg total) by mouth daily. 90 tablet 1   aspirin EC 81 MG tablet Take 1 tablet (81 mg total) by mouth daily. Swallow whole. 100 tablet 0   fluticasone (FLONASE) 50 MCG/ACT nasal spray Place 2 sprays into both nostrils daily.     lisinopril (ZESTRIL) 2.5 MG tablet Take 1 tablet (2.5 mg total) by mouth daily. 90 tablet 1   loratadine (CLARITIN) 10 MG tablet Take 1 tablet (10 mg total) by mouth daily as needed for allergies. 14 tablet 0   melatonin 3 MG TABS tablet Take 1 tablet (3 mg total) by mouth at bedtime. 30 tablet 0    rosuvastatin (CRESTOR) 20 MG tablet Take 1 tablet (20 mg total) by mouth daily. 90 tablet 1   senna-docusate (SENOKOT-S) 8.6-50 MG tablet Take 1 tablet by mouth at bedtime.     No facility-administered medications prior to visit.    PAST MEDICAL HISTORY: Past Medical History:  Diagnosis Date   OSA (obstructive sleep apnea)    Thyroid activity decreased    late 20's    PAST SURGICAL HISTORY: Past Surgical History:  Procedure Laterality Date   CESAREAN SECTION     Feb '89   EXCISIONAL HEMORRHOIDECTOMY      FAMILY HISTORY: Family History  Problem Relation Age of Onset   Colon cancer Neg Hx     SOCIAL HISTORY: Social History   Socioeconomic History   Marital status: Married    Spouse name: Not on file   Number of children: Not on file   Years of education: Not on file   Highest education level: Not on file  Occupational History   Not on file  Tobacco Use   Smoking status: Some Days   Smokeless tobacco: Never  Substance and Sexual Activity   Alcohol use: Yes    Comment: rare   Drug use: No   Sexual activity: Not on file  Other Topics Concern   Not on file  Social History Narrative   Not on file   Social Drivers of Health   Financial Resource Strain: Not on file  Food Insecurity: No Food Insecurity (06/15/2023)   Hunger Vital Sign    Worried About Running Out of Food in the Last Year: Never true    Ran Out of Food in the Last Year: Never true  Transportation Needs: No Transportation Needs (06/15/2023)   PRAPARE - Administrator, Civil Service (Medical): No    Lack of Transportation (Non-Medical): No  Physical Activity: Not on file  Stress: Not on file  Social Connections: Not on file  Intimate Partner Violence: Not At Risk (06/15/2023)   Humiliation, Afraid, Rape, and Kick questionnaire    Fear of Current or Ex-Partner: No    Emotionally Abused: No    Physically Abused: No    Sexually Abused: No      PHYSICAL EXAM  Vitals:   08/28/23  0939  BP: 116/68  Pulse: 74  Weight: 215 lb (97.5 kg)  Height: 5\' 4"  (1.626 m)   Body mass index is 36.9 kg/m.  Generalized: Well developed, in no acute distress   Neurological examination  Mentation: Alert oriented to time, place, history taking. Follows all commands speech and language fluent.  Flat affect Cranial nerve II-XII: Pupils were equal round reactive to light. Extraocular movements were full, visual field were full on confrontational test. Facial sensation and strength were normal. Uvula tongue midline. Head turning and shoulder shrug  were normal and symmetric. Motor:  The motor testing reveals 5 over 5 strength of all 4 extremities. Good symmetric motor tone is noted throughout.  Slightly weaker in the left hand Sensory: Sensory testing is intact to soft touch on all 4 extremities. No evidence of extinction is noted.  Coordination: Cerebellar testing reveals good finger-nose-finger and heel-to-shin bilaterally.  Gait and station: Gait is normal.    DIAGNOSTIC DATA (LABS, IMAGING, TESTING) - I reviewed patient records, labs, notes, testing and imaging myself where available.  Lab Results  Component Value Date   WBC 6.3 06/18/2023   HGB 12.2 06/18/2023   HCT 38.0 06/18/2023   MCV 85.2 06/18/2023   PLT 256 06/18/2023      Component Value Date/Time   NA 139 06/18/2023 0518   K 3.7 06/18/2023 0518   CL 105 06/18/2023 0518   CO2 24 06/18/2023 0518   GLUCOSE 104 (H) 06/18/2023 0518   BUN 14 06/18/2023 0518   CREATININE 0.80 06/24/2023 0501   CALCIUM 8.7 (L) 06/18/2023 0518   PROT 6.2 (L) 06/18/2023 0518   ALBUMIN 3.1 (L) 06/18/2023 0518   AST 30 06/18/2023 0518   ALT 26 06/18/2023 0518   ALKPHOS 61 06/18/2023 0518   BILITOT 0.5 06/18/2023 0518   GFRNONAA >60 06/24/2023 0501   Lab Results  Component Value Date   CHOL 178 06/14/2023   HDL 51 06/14/2023   LDLCALC 111 (H) 06/14/2023   TRIG 78 06/14/2023   CHOLHDL 3.5 06/14/2023   Lab Results  Component  Value Date   HGBA1C 5.6 06/14/2023     ASSESSMENT AND PLAN 63 y.o. year old female  has a past medical history of OSA (obstructive sleep apnea) and Thyroid activity decreased. here with:  Acute Ischemic Infarct:  right basal ganglia s/p TNK -acute infarct is adjacent to old hemorrhagic lacunar infarct in the same region Etiology:  Small Vessel disease Obstructive sleep apnea on CPAP   Continue aspirin 81 mg daily  for secondary stroke prevention.  Discussed secondary stroke prevention measures and importance of close PCP follow up for aggressive stroke risk factor management. I have gone over the pathophysiology of stroke, warning signs and symptoms, risk factors and their management in some detail with instructions to go to the closest emergency room for symptoms of concern. HTN: BP goal <130/90.   HLD: LDL goal <70. Recent LDL 111. On crestor 20 mg DMII: A1c goal<7.0. Recent A1c 5.6.  Encouraged patient to monitor diet and encouraged exercise Continue CPAP therapy per research trial FU with our office in 6 to 8 months or sooner if needed      Butch Penny, MSN, NP-C 08/27/2023, 5:12 PM [facility] [address], [address] [place], [place] [address] [phone]

## 2023-08-28 ENCOUNTER — Encounter: Payer: Self-pay | Admitting: Adult Health

## 2023-08-28 ENCOUNTER — Ambulatory Visit (INDEPENDENT_AMBULATORY_CARE_PROVIDER_SITE_OTHER): Payer: Self-pay | Admitting: Adult Health

## 2023-08-28 VITALS — BP 116/68 | HR 74 | Ht 64.0 in | Wt 215.0 lb

## 2023-08-28 DIAGNOSIS — G4733 Obstructive sleep apnea (adult) (pediatric): Secondary | ICD-10-CM

## 2023-08-28 DIAGNOSIS — I6381 Other cerebral infarction due to occlusion or stenosis of small artery: Secondary | ICD-10-CM

## 2023-08-28 DIAGNOSIS — E785 Hyperlipidemia, unspecified: Secondary | ICD-10-CM

## 2023-08-28 NOTE — Patient Instructions (Signed)
Your Plan:  Continue ASA  Blood pressure goal <130/90 Cholesterol LDL goal <70 Diabetes goal A1c <7 Monitor diet and try to exercise   Thank you for coming to see us at [facility]. I hope we have been able to provide you high quality care today.  You may receive a patient satisfaction survey over the next few weeks. We would appreciate your feedback and comments so that we may continue to improve ourselves and the health of our patients.  

## 2023-08-29 ENCOUNTER — Telehealth: Payer: Self-pay | Admitting: Adult Health

## 2023-08-29 NOTE — Progress Notes (Signed)
I agree with the above plan 

## 2023-08-29 NOTE — Progress Notes (Signed)
Patient traveling to [place] in February. Consulted with Dr. Pearlean Brownie. Ok to travel- encouraged her to get up frequently and move her legs. Drink 1 glass of water every hour of the flight. Called patient and made her aware of recommendations. She voiced understanding. No further questions.

## 2023-09-25 ENCOUNTER — Other Ambulatory Visit: Payer: Self-pay

## 2023-09-25 NOTE — Patient Outreach (Signed)
Telephone outreach to patient to obtain mRS was successfully completed. MRS= 1  Vanice Sarah [facility] Health Care Management Assistant  Direct Dial: [phone]  Fax: [phone] Website: [email or web address]

## 2023-09-25 NOTE — Telephone Encounter (Signed)
error 

## 2023-10-20 ENCOUNTER — Other Ambulatory Visit: Payer: Self-pay | Admitting: Internal Medicine

## 2023-10-20 DIAGNOSIS — I10 Essential (primary) hypertension: Secondary | ICD-10-CM

## 2023-11-04 ENCOUNTER — Encounter: Payer: Self-pay | Admitting: Internal Medicine

## 2023-11-04 ENCOUNTER — Ambulatory Visit: Payer: Self-pay | Attending: Internal Medicine | Admitting: Internal Medicine

## 2023-11-04 VITALS — BP 117/80 | HR 66 | Temp 98.1°F | Ht 64.0 in | Wt 209.0 lb

## 2023-11-04 DIAGNOSIS — I10 Essential (primary) hypertension: Secondary | ICD-10-CM

## 2023-11-04 DIAGNOSIS — G4733 Obstructive sleep apnea (adult) (pediatric): Secondary | ICD-10-CM

## 2023-11-04 DIAGNOSIS — I6930 Unspecified sequelae of cerebral infarction: Secondary | ICD-10-CM

## 2023-11-04 DIAGNOSIS — Z2821 Immunization not carried out because of patient refusal: Secondary | ICD-10-CM

## 2023-11-04 DIAGNOSIS — M25512 Pain in left shoulder: Secondary | ICD-10-CM

## 2023-11-04 MED ORDER — LISINOPRIL 2.5 MG PO TABS: 2.5000 mg | ORAL_TABLET | Freq: Every day | ORAL | 1 refills | Status: DC

## 2023-11-04 MED ORDER — ROSUVASTATIN CALCIUM 20 MG PO TABS: 20.0000 mg | ORAL_TABLET | Freq: Every day | ORAL | 1 refills | Status: DC

## 2023-11-04 NOTE — Progress Notes (Signed)
 Patient ID: Rebekah Mendez, female    DOB: 1961-02-18  MRN: [id number]  CC: Hypertension (HTN  f/u. Med refills. Jolayne Panther shoulder pain X2 weeks /No to all vax)   Subjective: Rebekah Mendez is a 63 y.o. female who presents for chronic ds management. Husband, Dorene Sorrow, is with her. 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  Still decrease grab in LT but better. On ASA and Crestor Had sleep study 06/2023 while in hosp.  +OSA.  Using CPAP nightly.  HTN:  taking Lisinopril 2.5 mg and norvasc 10 mg consistently.  Checks BP regularly but not in past few wks as they were on vacation.  BP last evening was 145/87 but she was tired from work. Limits salt. Still free of cigarettes. I note low calcium on last 2 chemistries.  C/o LT shoulder pain x 2 wks Not sure if she hurt it doing something or just from limited ROM post CVA as it is a little weaker.  She does some lifting at work and uses RT side more so; Rt side dominant Pain with certain movement like reaching for objects and trying to raise above head.   Does not take anything for pain Lifts up to 30 lbs in her store. Spouse inquires is she qualify for disability?  HM:  did not qualify for [employer] scholarship. Declines PCV 20, and shingles.  Yes for PAP Patient Active Problem List   Diagnosis Date Noted   OSA on CPAP 11/04/2023   History of cerebrovascular accident (CVA) with residual deficit 07/06/2023   Lesion of skin of nose 07/06/2023   Coping style affecting medical condition 06/24/2023   Essential hypertension 06/17/2023   Tobacco use disorder 06/17/2023   Obesity 06/17/2023   Cerebrovascular accident (CVA) of right basal ganglia (HCC) 06/17/2023   Infarction of right basal ganglia (HCC) 06/13/2023     Current Outpatient Medications on  File Prior to Visit  Medication Sig Dispense Refill   amLODipine (NORVASC) 10 MG tablet TAKE 1 TABLET BY MOUTH EVERY DAY 90 tablet 1   aspirin EC 81 MG tablet Take 1 tablet (81 mg total) by mouth daily. Swallow whole. 100 tablet 0   melatonin 3 MG TABS tablet Take 1 tablet (3 mg total) by mouth at bedtime. 30 tablet 0   senna-docusate (SENOKOT-S) 8.6-50 MG tablet Take 1 tablet by mouth at bedtime. (Patient not taking: Reported on 11/04/2023)     No current facility-administered medications on file prior to visit.    No Known Allergies  Social History   Socioeconomic History   Marital status: Married    Spouse name: Not on file   Number of children: Not on file   Years of education: Not on file   Highest education level: Not on file  Occupational History   Not on file  Tobacco Use   Smoking status: Some Days   Smokeless tobacco: Never  Substance and Sexual Activity   Alcohol use: Yes    Comment: rare   Drug use: No   Sexual activity: Not on file  Other Topics Concern   Not on file  Social History Narrative   Not on file   Social Drivers of Health   Financial Resource Strain: Low Risk  (11/04/2023)   Overall Financial Resource Strain (CARDIA)  Difficulty of Paying Living Expenses: Not hard at all  Food Insecurity: No Food Insecurity (11/04/2023)   Hunger Vital Sign    Worried About Running Out of Food in the Last Year: Never true    Ran Out of Food in the Last Year: Never true  Transportation Needs: No Transportation Needs (11/04/2023)   PRAPARE - Administrator, Civil Service (Medical): No    Lack of Transportation (Non-Medical): No  Physical Activity: Inactive (11/04/2023)   Exercise Vital Sign    Days of Exercise per Week: 0 days    Minutes of Exercise per Session: 0 min  Stress: No Stress Concern Present (11/04/2023)   [employer] of Occupational Health - Occupational Stress Questionnaire    Feeling of Stress : Not at all  Social Connections:  Socially Integrated (11/04/2023)   Social Connection and Isolation Panel [NHANES]    Frequency of Communication with Friends and Family: More than three times a week    Frequency of Social Gatherings with Friends and Family: Three times a week    Attends Religious Services: More than 4 times per year    Active Member of Clubs or Organizations: Yes    Attends Banker Meetings: More than 4 times per year    Marital Status: Married  Catering manager Violence: Not At Risk (11/04/2023)   Humiliation, Afraid, Rape, and Kick questionnaire    Fear of Current or Ex-Partner: No    Emotionally Abused: No    Physically Abused: No    Sexually Abused: No    Family History  Problem Relation Age of Onset   Colon cancer Neg Hx     Past Surgical History:  Procedure Laterality Date   CESAREAN SECTION     Feb '89   EXCISIONAL HEMORRHOIDECTOMY      ROS: Review of Systems Negative except as stated above  PHYSICAL EXAM: BP 117/80 (BP Location: Left Arm, Patient Position: Sitting, Cuff Size: Large)   Pulse 66   Temp 98.1 F (36.7 C) (Oral)   Ht 5\' 4"  (1.626 m)   Wt 209 lb (94.8 kg)   SpO2 100%   BMI 35.87 kg/m   Physical Exam  General appearance - alert, well appearing, older caucasian female female and in no distress Mental status - normal mood, behavior, speech, dress, motor activity, and thought processes Chest - clear to auscultation, no wheezes, rales or rhonchi, symmetric air entry Heart - normal rate, regular rhythm, normal S1, S2, no murmurs, rubs, clicks or gallops Neurological -grip 4+/5 left, 5/5 right.  UE distal strength 5/5 left, 5/5 right.  Proximal strength upper extremities: 5/5 right, 4+/5 left Extremities - peripheral pulses normal, no pedal edema, no clubbing or cyanosis MSK: Left shoulder: No point tenderness.  Mild to moderate discomfort with attempted passive elevation and backward movement.  Drop arm test positive.     Latest Ref Rng & Units 06/24/2023     5:01 AM 06/18/2023    5:18 AM 06/17/2023    4:52 AM  CMP  Glucose 70 - 99 mg/dL  409  811   BUN 6 - 20 mg/dL  14  14   Creatinine 9.14 - 1.00 mg/dL 7.82  9.56  2.13   Sodium 135 - 145 mmol/L  139  138   Potassium 3.5 - 5.1 mmol/L  3.7  3.5   Chloride 98 - 111 mmol/L  105  106   CO2 22 - 32 mmol/L  24  23   Calcium  8.9 - 10.3 mg/dL  8.7  8.5   Total Protein 6.5 - 8.1 g/dL  6.2    Total Bilirubin <1.2 mg/dL  0.5    Alkaline Phos 38 - 126 U/L  61    AST 15 - 41 U/L  30    ALT 0 - 44 U/L  26     Lipid Panel     Component Value Date/Time   CHOL 178 06/14/2023 0220   TRIG 78 06/14/2023 0220   HDL 51 06/14/2023 0220   CHOLHDL 3.5 06/14/2023 0220   VLDL 16 06/14/2023 0220   LDLCALC 111 (H) 06/14/2023 0220    CBC    Component Value Date/Time   WBC 6.3 06/18/2023 0518   RBC 4.46 06/18/2023 0518   HGB 12.2 06/18/2023 0518   HCT 38.0 06/18/2023 0518   PLT 256 06/18/2023 0518   MCV 85.2 06/18/2023 0518   MCH 27.4 06/18/2023 0518   MCHC 32.1 06/18/2023 0518   RDW 12.9 06/18/2023 0518   LYMPHSABS 2.2 06/18/2023 0518   MONOABS 0.5 06/18/2023 0518   EOSABS 0.2 06/18/2023 0518   BASOSABS 0.1 06/18/2023 0518    ASSESSMENT AND PLAN: 1. Essential hypertension (Primary) At goal.  Continue lisinopril 2.5 mg daily and Norvasc 10 mg daily. - lisinopril (ZESTRIL) 2.5 MG tablet; Take 1 tablet (2.5 mg total) by mouth daily.  Dispense: 90 tablet; Refill: 1  2. History of cerebrovascular accident (CVA) with residual deficit Continue aspirin and Crestor. In regards to the question about whether she is disabled, my opinion is not yet.  She is right-hand dominant and has mild residual weakness in the left grip. Can get opinion from neurology as well - rosuvastatin (CRESTOR) 20 MG tablet; Take 1 tablet (20 mg total) by mouth daily.  Dispense: 90 tablet; Refill: 1  3. OSA on CPAP Using CPAP consistently with benefit.  4. Acute pain of left shoulder Suspect rotator cuff issue.  Will refer  to orthopedics. - AMB referral to orthopedics  5. Hypocalcemia Likely vitamin D deficiency.  Recommend purchasing vitamin D 400 international units over-the-counter and taking 1 daily  6. Pneumococcal vaccination declined   7. Herpes zoster vaccination declined   Patient was given the opportunity to ask questions.  Patient verbalized understanding of the plan and was able to repeat key elements of the plan.   This documentation was completed using Paediatric nurse.  Any transcriptional errors are unintentional.  Orders Placed This Encounter  Procedures   AMB referral to orthopedics     Requested Prescriptions   Signed Prescriptions Disp Refills   lisinopril (ZESTRIL) 2.5 MG tablet 90 tablet 1    Sig: Take 1 tablet (2.5 mg total) by mouth daily.   rosuvastatin (CRESTOR) 20 MG tablet 90 tablet 1    Sig: Take 1 tablet (20 mg total) by mouth daily.    Return in about 2 months (around 01/04/2024) for PAP.  Jonah Blue, MD, FACP

## 2023-11-04 NOTE — Patient Instructions (Signed)
 Purchase Vitamin D 400 international units and take one daily.

## 2023-11-17 ENCOUNTER — Ambulatory Visit (INDEPENDENT_AMBULATORY_CARE_PROVIDER_SITE_OTHER): Payer: Self-pay | Admitting: Orthopedic Surgery

## 2023-11-17 ENCOUNTER — Other Ambulatory Visit: Payer: Self-pay

## 2023-11-17 ENCOUNTER — Encounter: Payer: Self-pay | Admitting: Orthopedic Surgery

## 2023-11-17 DIAGNOSIS — M25612 Stiffness of left shoulder, not elsewhere classified: Secondary | ICD-10-CM

## 2023-11-17 DIAGNOSIS — M7502 Adhesive capsulitis of left shoulder: Secondary | ICD-10-CM

## 2023-11-18 ENCOUNTER — Encounter: Payer: Self-pay | Admitting: Orthopedic Surgery

## 2023-11-18 NOTE — Progress Notes (Unsigned)
 Office Visit Note   Patient: Rebekah Mendez           Date of Birth: 1961-03-01           MRN: [id number] Visit Date: 11/17/2023 Requested by: Marcine Matar, MD [address] [place],  [place] [address] PCP: Marcine Matar, MD  Subjective: Chief Complaint  Patient presents with   Left Shoulder - Pain    HPI: Rebekah Mendez is a 63 y.o. female who presents to the office reporting left shoulder pain of 4 weeks duration.  Unsure of an exact injury.  Patient did have a stroke in November 2024.  That did affect her left side in terms of strength.  She was able to go back to work after rehab at the end of January.  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  She reports shooting pain in the shoulder region with movements like forward flexion and abduction.  That has been going on for 4 weeks.  She is right-hand dominant.  The pain does wake her from sleep at night.  Does report some neck pain but no scapular pain.  She states that after the stroke she was able to get back to 100% with range of motion but that has decreased significantly over the past 4 weeks.  Denies any new mechanical symptoms..                ROS: All systems reviewed are negative as they relate to the chief complaint within the history of present illness.  Patient denies fevers or chills.  Assessment & Plan: Visit Diagnoses:  1. Decreased range of motion of left shoulder   2. Adhesive capsulitis of left shoulder     Plan: Impression is left shoulder adhesive capsulitis with restriction of passive range of motion.  Been going on for about a month.  Rotator cuff pathology is possible but difficult to fully assess in light of having a stroke which has affected the strength of that left arm.  Overall no coarse grinding or crepitus on the left side even though there is a slight amount that on the right-hand side with no weakness.  The most significant  finding on exam today is the restricted passive motion in that left shoulder.  To that end we will start physical therapy and perform an ultrasound-guided injection into the left glenohumeral joint today.  6-week return with repeat assessment of range of motion.  I did discuss with Sokha and her husband the importance of doing range of motion exercises daily to the tune of approximately 1 hour a day in order to achieve more functional range of motion.  Surgical intervention could be considered if were not making any progress over the next 6 to 12 weeks.  Follow-up in 6 weeks for clinical recheck.  Follow-Up Instructions: No follow-ups on file.   Orders:  Orders Placed This Encounter  Procedures   XR Shoulder Left   US Guided Needle Placement - No Linked Charges   Ambulatory referral to Physical Therapy   No orders of the defined types were placed in this encounter.     Procedures: Large Joint Inj: L glenohumeral on 11/17/2023 7:10 AM Indications: diagnostic evaluation and pain Details: 18 G 1.5 in needle, posterior approach  Arthrogram: No  Medications: 9 mL bupivacaine 0.5 %; 40 mg methylPREDNISolone acetate 40 MG/ML; 5 mL lidocaine 1 % Outcome: tolerated well, no immediate  complications Procedure, treatment alternatives, risks and benefits explained, specific risks discussed. Consent was given by the patient. Immediately prior to procedure a time out was called to verify the correct patient, procedure, equipment, support staff and site/side marked as required. Patient was prepped and draped in the usual sterile fashion.       Clinical Data: No additional findings.  Objective: Vital Signs: There were no vitals taken for this visit.  Physical Exam:  Constitutional: Patient appears well-developed HEENT:  Head: Normocephalic Eyes:EOM are normal Neck: Normal range of motion Cardiovascular: Normal rate Pulmonary/chest: Effort normal Neurologic: Patient is alert Skin: Skin  is warm Psychiatric: Patient has normal mood and affect  Ortho Exam: Ortho exam demonstrates range of motion passive on the right of 75/110/175.  Range of motion on the left passive is 10/60/85.  Rotator cuff strength is actually pretty symmetric infraspinatus supraspinatus and subscap testing.  Pretty symmetric grip EPL FPL interosseous wrist flexion and extension bicep triceps and deltoid strength.  No discrete AC joint tenderness left versus right.  Cervical spine range of motion intact.  No definite paresthesias C5-T1.  Specialty Comments:  No specialty comments available.  Imaging: XR Shoulder Left Result Date: 11/18/2023 AP outlet axillary lateral radiographs left shoulder reviewed.  No acute fracture.  Shoulder is located.  Acromiohumeral distance is normal.  No significant degenerative changes in the glenohumeral or AC joint.  Visualized lung fields clear.   US Guided Needle Placement - No Linked Charges Result Date: 11/18/2023 Ultrasound imaging demonstrates needle placement into the glenohumeral joint with injection of fluid into the joint and no complicating features. Left shoulder.    PMFS History: Patient Active Problem List   Diagnosis Date Noted   OSA on CPAP 11/04/2023   History of cerebrovascular accident (CVA) with residual deficit 07/06/2023   Lesion of skin of nose 07/06/2023   Coping style affecting medical condition 06/24/2023   Essential hypertension 06/17/2023   Tobacco use disorder 06/17/2023   Obesity 06/17/2023   Cerebrovascular accident (CVA) of right basal ganglia (HCC) 06/17/2023   Infarction of right basal ganglia (HCC) 06/13/2023   Past Medical History:  Diagnosis Date   OSA (obstructive sleep apnea)    Thyroid activity decreased    late 20's    Family History  Problem Relation Age of Onset   Colon cancer Neg Hx     Past Surgical History:  Procedure Laterality Date   CESAREAN SECTION     Feb '89   EXCISIONAL HEMORRHOIDECTOMY     Social  History   Occupational History   Not on file  Tobacco Use   Smoking status: Some Days   Smokeless tobacco: Never  Substance and Sexual Activity   Alcohol use: Yes    Comment: rare   Drug use: No   Sexual activity: Not on file

## 2023-11-19 MED ORDER — BUPIVACAINE HCL 0.5 % IJ SOLN
9.0000 mL | INTRAMUSCULAR | Status: AC | PRN
  Administered 2023-11-17: 9 mL via INTRA_ARTICULAR

## 2023-11-19 MED ORDER — METHYLPREDNISOLONE ACETATE 40 MG/ML IJ SUSP
40.0000 mg | INTRAMUSCULAR | Status: AC | PRN
  Administered 2023-11-17: 40 mg via INTRA_ARTICULAR

## 2023-11-19 MED ORDER — LIDOCAINE HCL 1 % IJ SOLN
5.0000 mL | INTRAMUSCULAR | Status: AC | PRN
  Administered 2023-11-17: 5 mL

## 2023-12-01 ENCOUNTER — Encounter: Payer: Self-pay | Admitting: Rehabilitative and Restorative Service Providers"

## 2023-12-01 ENCOUNTER — Ambulatory Visit (INDEPENDENT_AMBULATORY_CARE_PROVIDER_SITE_OTHER): Payer: Self-pay | Admitting: Rehabilitative and Restorative Service Providers"

## 2023-12-01 DIAGNOSIS — G8929 Other chronic pain: Secondary | ICD-10-CM

## 2023-12-01 DIAGNOSIS — R293 Abnormal posture: Secondary | ICD-10-CM

## 2023-12-01 DIAGNOSIS — M6281 Muscle weakness (generalized): Secondary | ICD-10-CM

## 2023-12-01 DIAGNOSIS — M25612 Stiffness of left shoulder, not elsewhere classified: Secondary | ICD-10-CM

## 2023-12-01 DIAGNOSIS — M25512 Pain in left shoulder: Secondary | ICD-10-CM

## 2023-12-01 NOTE — Therapy (Signed)
 OUTPATIENT PHYSICAL THERAPY EVALUATION   Patient Name: Rebekah Mendez MRN: [id number] DOB:July 04, 1961, 63 y.o., female Today's Date: 12/01/2023  END OF SESSION:  PT End of Session - 12/01/23 1514     Visit Number 1    Number of Visits 20    Date for PT Re-Evaluation 02/09/24    Authorization Type Self Pay    PT Start Time 1515    PT Stop Time 1553    PT Time Calculation (min) 38 min    Activity Tolerance Patient tolerated treatment well    Behavior During Therapy [facility] for tasks assessed/performed             Past Medical History:  Diagnosis Date   OSA (obstructive sleep apnea)    Thyroid activity decreased    late 20's   Past Surgical History:  Procedure Laterality Date   CESAREAN SECTION     Feb '89   EXCISIONAL HEMORRHOIDECTOMY     Patient Active Problem List   Diagnosis Date Noted   OSA on CPAP 11/04/2023   History of cerebrovascular accident (CVA) with residual deficit 07/06/2023   Lesion of skin of nose 07/06/2023   Coping style affecting medical condition 06/24/2023   Essential hypertension 06/17/2023   Tobacco use disorder 06/17/2023   Obesity 06/17/2023   Cerebrovascular accident (CVA) of right basal ganglia (HCC) 06/17/2023   Infarction of right basal ganglia (HCC) 06/13/2023    PCP: Lawrance Presume MD  REFERRING PROVIDER: Jasmine Mesi, MD  REFERRING DIAG: [phone] (ICD-10-CM) - Decreased range of motion of left shoulder  THERAPY DIAG:  Chronic left shoulder pain  Muscle weakness (generalized)  Stiffness of left shoulder, not elsewhere classified  Abnormal posture  Rationale for Evaluation and Treatment: Rehabilitation  ONSET DATE: March 2025  SUBJECTIVE:                                                                                                                                                                                                         SUBJECTIVE STATEMENT: Pt indicated Lt shoulder pain with limits in  movement and ability.  Pt indicated having stroke with inpatient rehab in 2024.  Pt indicated insidious onset of increased symptoms in March 2025.  Saw MD and then ortho MD recently.  Pt indicated having some trouble with sleeping due to symptoms.   Injection provided on 11/17/2023 MD visit to [facility] joint.  Pt indicated some reduction of pain that was at rest. Pt indicated getting exercises.   PERTINENT HISTORY:  History of stroke in Nov 2024 with  Lt side involvement.   Reported getting "pretty close" on strength after stroke rehab.   PAIN:  NPRS scale: at worst 7 /10 Pain location: Lt shoulder anterior/superior shoulder, sometimes into upper arm.  Pain description: jabbing, sharp pain with movement Aggravating factors: lifting arm, reaching, jarring movements, sleeping.  Relieving factors: injection.   PRECAUTIONS: None  RED FLAGS: None  WEIGHT BEARING RESTRICTIONS: No  FALLS:  Has patient fallen in last 6 months? 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    PLOF: Independent, play with grandkids  Rt hand dominant  PATIENT GOALS: Reduce pain, improve arm movement.    OBJECTIVE:   PATIENT SURVEYS: Patient-Specific Activity Scoring Scheme  "0" represents "unable to perform." "10" represents "able to perform at prior level. 0 1 2 3 4 5 6 7 8 9  10 (Date and Score)   Activity Eval  12/01/2023    1. Lifting arm to side 4    2. Arm behind back  4    3. Lt arm overhead 4   4.    5.    Score 4 avg    Total score = sum of the activity scores/number of activities Minimum detectable change (90%CI) for average score = 2 points Minimum detectable change (90%CI) for single activity score = 3 points  COGNITION: 12/01/2023 Overall cognitive status: Within functional limits for tasks assessed  SENSATION: 12/01/2023 [facility]  POSTURE:  12/01/2023 Rounded shoulders.   PALPATION: 12/01/2023 Tenderness Lt  shoulder superior, anterior, lateral.    UPPER EXTREMITY ROM:   ROM Right Eval 12/01/2023 Left Eval 12/01/2023  Shoulder flexion  110  Shoulder extension    Shoulder abduction  85  Shoulder adduction    Shoulder extension    Shoulder internal rotation  60  in 45 deg abduction  Shoulder external rotation  30 in 45 deg abduction  Elbow flexion    Elbow extension                        Hand behind back T7 Lt PSIS with pain   (Blank rows = not tested)  UPPER EXTREMITY MMT:  MMT Right Eval 12/01/2023 Left Eval 12/01/2023  Shoulder flexion 5/5 4/5 c pain  Shoulder extension    Shoulder abduction 5/5 4/5 c pain  Shoulder adduction    Shoulder extension    Shoulder internal rotation 5/5 3+/5  Shoulder external rotation 5/5 3+/5 c pain  Middle trapezius    Lower trapezius    Elbow flexion 5/5 4/5 c pain  Elbow extension 5/5 4/5  Wrist flexion    Wrist extension    Wrist ulnar deviation    Wrist radial deviation    Wrist pronation    Wrist supination    Grip strength     (Blank rows = not tested)  SPECIAL TESTS:  12/01/2023 (+) Shrug on Lt, (-) Drop arm  FUNCTIONAL TESTS:  12/01/2023 No specific testing today  TODAY'S TREATMENT:                                                                                                       DATE: 12/01/2023 Therex:    HEP instruction/performance c cues for techniques, handout provided.  Trial set performed of each for comprehension and symptom assessment.  See below for exercise list  Manual Supine Lt shoulder inferior glides g3 in various flexion, scaption ROM.  Posterior glide Lt GH joint with passive ER for mobilization c movement.    PATIENT EDUCATION:  12/01/2023 Education details: HEP, POC Person educated: Patient Education method:  Explanation, Demonstration, Verbal cues, and Handouts Education comprehension: verbalized understanding, returned demonstration, and verbal cues required  HOME EXERCISE PROGRAM: Access Code: [email or web address] URL: [email or web address] Date: 12/01/2023 Prepared by: Bonna Bustard  Exercises - Seated Scapular Retraction  - 3-5 x daily - 7 x weekly - 1 sets - 10 reps - 3-5 hold - Supine Shoulder Flexion Extension AAROM with Dowel  - 1-2 x daily - 7 x weekly - 1-2 sets - 10-15 reps - 3 hold - Seated Shoulder Flexion Self PROM  - 1-2 x daily - 7 x weekly - 1-2 sets - 10 reps - Standing Shoulder Posterior Capsule Stretch  - 1-2 x daily - 7 x weekly - 1 sets - 3-5 reps - 15 hold  ASSESSMENT:  CLINICAL IMPRESSION: Patient is a 63 y.o. who comes to clinic with complaints of Lt shoulder pain with mobility, strength and movement coordination deficits that impair their ability to perform usual daily and recreational functional activities without increase difficulty/symptoms at this time.  Patient to benefit from skilled PT services to address impairments and limitations to improve to previous level of function without restriction secondary to condition.    OBJECTIVE IMPAIRMENTS: decreased activity tolerance, decreased coordination, decreased endurance, decreased mobility, decreased ROM, decreased strength, hypomobility, increased fascial restrictions, impaired perceived functional ability, increased muscle spasms, impaired flexibility, impaired UE functional use, improper body mechanics, postural dysfunction, and pain.   ACTIVITY LIMITATIONS: carrying, lifting, sleeping, bed mobility, bathing, toileting, dressing, reach over head, and hygiene/grooming  PARTICIPATION LIMITATIONS: meal prep, cleaning, laundry, interpersonal relationship, driving, shopping, community activity, and occupation  PERSONAL FACTORS:  History of stroke in Nov 2024 with Lt side involvement.   are also affecting patient's  functional outcome.   REHAB POTENTIAL: Good  CLINICAL DECISION MAKING: Stable/uncomplicated  EVALUATION COMPLEXITY: Low   GOALS: Goals reviewed with patient? Yes  SHORT TERM GOALS: (target date for Short term goals are 3 weeks 12/22/2023)  1.Patient will demonstrate independent use of home exercise program to maintain progress from in clinic treatments. Goal status: New  LONG TERM GOALS: (target dates for all long term goals are 10 weeks  02/09/2024 )   1. Patient will demonstrate/report pain at worst less than or equal to 2/10 to facilitate minimal limitation in daily activity secondary to pain symptoms. Goal status: New   2. Patient will demonstrate independent use of home exercise program to facilitate ability to maintain/progress functional gains from skilled physical therapy services. Goal status: New  3. Patient will demonstrate Patient specific functional scale avg > or = 8/10 to indicate reduced disability due to condition.  Goal status: New   4.  Patient will demonstrate Lt shoulder  AROM WFL s symptoms to facilitate usual shoulder  movements for daily activity including driving, self care, dressing, work.   Goal status: New   5.  Patient will demonstrate Lt shoulder MMT > or = 5/5 for return to work at [employer].   Goal status: New   6.  Patient will demonstrate/report ability to lift for work s restriction due to symptoms.  Goal status: New   PLAN:  PT FREQUENCY: 1-2x/week  PT DURATION: 10 weeks  Can include 72536- PT Re-evaluation, 97110-Therapeutic exercises, 97530- Therapeutic activity, 97112- Neuromuscular re-education, 97535- Self Care, 97140- Manual therapy, [phone]- Gait training, [phone]- Orthotic Fit/training, [phone]- Canalith repositioning, [id number]- Aquatic Therapy, [phone] Electrical stimulation (unattended), [phone]- Electrical stimulation (manual), [id number]- Vasopneumatic device, [id number]- Ultrasound, [id number] Physical performance testing, [id number]- Traction (mechanical),  [id number]- Ionotophoresis 4mg /ml Dexamethasone, Patient/Family education, Balance training, Stair training, Taping, Dry Needling, Joint mobilization, Joint manipulation, Spinal manipulation, Spinal mobilization, Scar mobilization, Vestibular training, Visual/preceptual remediation/compensation, DME instructions, Cryotherapy, and Moist heat.  All performed as medically necessary.  All included unless contraindicated  PLAN FOR NEXT SESSION: Check HEP use/response.  Progress Lt arm mobility and early strengthening within pain response.    Bonna Bustard, PT, DPT, OCS, ATC 12/01/23  4:29 PM

## 2023-12-09 NOTE — Therapy (Signed)
 OUTPATIENT PHYSICAL THERAPY TREATMENT   Patient Name: Rebekah Mendez MRN: [id number] DOB:Aug 18, 1960, 63 y.o., female Today's Date: 12/10/2023  END OF SESSION:  PT End of Session - 12/10/23 0752     Visit Number 2    Number of Visits 20    Date for PT Re-Evaluation 02/09/24    Authorization Type Self Pay    Progress Note Due on Visit 10    PT Start Time 0758    PT Stop Time 0837    PT Time Calculation (min) 39 min    Activity Tolerance Patient limited by fatigue    Behavior During Therapy [facility] for tasks assessed/performed              Past Medical History:  Diagnosis Date   OSA (obstructive sleep apnea)    Thyroid activity decreased    late 20's   Past Surgical History:  Procedure Laterality Date   CESAREAN SECTION     Feb '89   EXCISIONAL HEMORRHOIDECTOMY     Patient Active Problem List   Diagnosis Date Noted   OSA on CPAP 11/04/2023   History of cerebrovascular accident (CVA) with residual deficit 07/06/2023   Lesion of skin of nose 07/06/2023   Coping style affecting medical condition 06/24/2023   Essential hypertension 06/17/2023   Tobacco use disorder 06/17/2023   Obesity 06/17/2023   Cerebrovascular accident (CVA) of right basal ganglia (HCC) 06/17/2023   Infarction of right basal ganglia (HCC) 06/13/2023    PCP: Lawrance Presume MD  REFERRING PROVIDER: Jasmine Mesi, MD  REFERRING DIAG: [phone] (ICD-10-CM) - Decreased range of motion of left shoulder  THERAPY DIAG:  Chronic left shoulder pain  Muscle weakness (generalized)  Stiffness of left shoulder, not elsewhere classified  Abnormal posture  Rationale for Evaluation and Treatment: Rehabilitation  ONSET DATE: March 2025  SUBJECTIVE:                                                                                                                                                                                                         SUBJECTIVE STATEMENT: Pt reported  feeling some complaints after exercises at times.  Otherwise doing ok.  No pain at rest upon arrival  PERTINENT HISTORY:  History of stroke in Nov 2024 with Lt side involvement.   Reported getting "pretty close" on strength after stroke rehab.   PAIN:  NPRS scale: 0/10 Pain location: Lt shoulder anterior/superior shoulder, sometimes into upper arm.  Pain description: jabbing, sharp pain with movement Aggravating factors: lifting arm, reaching, jarring movements, sleeping.  Relieving  factors: injection.   PRECAUTIONS: None  RED FLAGS: None  WEIGHT BEARING RESTRICTIONS: No  FALLS:  Has patient fallen in last 6 months? 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    PLOF: Independent, play with grandkids  Rt hand dominant  PATIENT GOALS: Reduce pain, improve arm movement.    OBJECTIVE:   PATIENT SURVEYS: Patient-Specific Activity Scoring Scheme  "0" represents "unable to perform." "10" represents "able to perform at prior level. 0 1 2 3 4 5 6 7 8 9  10 (Date and Score)   Activity Eval  12/01/2023    1. Lifting arm to side 4    2. Arm behind back  4    3. Lt arm overhead 4   4.    5.    Score 4 avg    Total score = sum of the activity scores/number of activities Minimum detectable change (90%CI) for average score = 2 points Minimum detectable change (90%CI) for single activity score = 3 points  COGNITION: 12/01/2023 Overall cognitive status: Within functional limits for tasks assessed  SENSATION: 12/01/2023 [facility]  POSTURE:  12/01/2023 Rounded shoulders.   PALPATION: 12/01/2023 Tenderness Lt shoulder superior, anterior, lateral.    UPPER EXTREMITY ROM:   ROM Right Eval 12/01/2023 Left Eval 12/01/2023 Left 12/10/2023  Shoulder flexion  110 120 AROM supine   Shoulder extension     Shoulder abduction  85   Shoulder adduction     Shoulder extension     Shoulder internal rotation  60   in 45 deg abduction   Shoulder external rotation  30 in 45 deg abduction 43 in 45 deg abduction  Elbow flexion     Elbow extension                              Hand behind back T7 Lt PSIS with pain    (Blank rows = not tested)  UPPER EXTREMITY MMT:  MMT Right Eval 12/01/2023 Left Eval 12/01/2023  Shoulder flexion 5/5 4/5 c pain  Shoulder extension    Shoulder abduction 5/5 4/5 c pain  Shoulder adduction    Shoulder extension    Shoulder internal rotation 5/5 3+/5  Shoulder external rotation 5/5 3+/5 c pain  Middle trapezius    Lower trapezius    Elbow flexion 5/5 4/5 c pain  Elbow extension 5/5 4/5  Wrist flexion    Wrist extension    Wrist ulnar deviation    Wrist radial deviation    Wrist pronation    Wrist supination    Grip strength     (Blank rows = not tested)  SPECIAL TESTS:  12/01/2023 (+) Shrug on Lt, (-) Drop arm  FUNCTIONAL TESTS:  12/01/2023 No specific testing today  TODAY'S TREATMENT:                                                                                                       DATE: 12/10/2023 Therex: Side lying Lt shoulder ER AROM with towel under arm x 15 Side lying Lt shoulder abduction x 17 Side lying Lt shoulder flexion x 10  Seated scapular retraction 3 sec hold x 10  Tband rows green bilateral c scapular retraction focus x 15  TherActivity (to improve functional reaching, lifting overhead) Pulley flexion with eccentric lowering focus with outstretched arm 2-3 sec hold 3 mins, scaption same.   Education for use at home.    Manual Supine Lt shoulder inferior glides g3 in various flexion, scaption ROM.  Posterior glide Lt GH joint with passive ER for mobilization c movement.  Percussive device to Lt posterior shoudler.  TODAY'S TREATMENT:                                                                                                        DATE: 12/01/2023 Therex:    HEP instruction/performance c cues for techniques, handout provided.  Trial set performed of each for comprehension and symptom assessment.  See below for exercise list  Manual Supine Lt shoulder inferior glides g3 in various flexion, scaption ROM.  Posterior glide Lt GH joint with passive ER for mobilization c movement.    PATIENT EDUCATION:  12/10/2023 Education details: HEP update Person educated: Patient Education method: Programmer, multimedia, Demonstration, Verbal cues, and Handouts Education comprehension: verbalized understanding, returned demonstration, and verbal cues required  HOME EXERCISE PROGRAM: Access Code: [email or web address] URL: [email or web address] Date: 12/10/2023 Prepared by: Bonna Bustard  Exercises - Seated Scapular Retraction  - 3-5 x daily - 7 x weekly - 1 sets - 10 reps - 3-5 hold - Supine Shoulder Flexion Extension AAROM with Dowel  - 1-2 x daily - 7 x weekly - 1-2 sets - 10-15 reps - 3 hold - Seated Shoulder Flexion Self PROM  - 1-2 x daily - 7 x weekly - 1-2 sets - 10 reps - Standing Shoulder Posterior Capsule Stretch  - 1-2 x daily - 7 x weekly - 1 sets - 3-5 reps - 15 hold - Sidelying Shoulder Abduction Palm Forward (Mirrored)  - 1-2 x daily - 7 x weekly - 2-3 sets - 10-15 reps - Sidelying Shoulder External Rotation  - 1-2 x daily - 7 x weekly - 2-3 sets - 10-15 reps - Sidelying Shoulder Flexion 15 Degrees  - 1-2 x daily - 7 x weekly - 2-3 sets - 10-15 reps  ASSESSMENT:  CLINICAL IMPRESSION: Mid range passive accessory Lt GH joint mobility  check showed no specific resistance.  Guarding/pain most noted in end range assessment today.  Some initial gains noted in recheck today upon arrival vs. 1st day.  Progressed to add active range mobility in gravity reduced postioning for Lt shoulder as noted in HEP update. Continued skilled PT services indicated at  this time.    OBJECTIVE IMPAIRMENTS: decreased activity tolerance, decreased coordination, decreased endurance, decreased mobility, decreased ROM, decreased strength, hypomobility, increased fascial restrictions, impaired perceived functional ability, increased muscle spasms, impaired flexibility, impaired UE functional use, improper body mechanics, postural dysfunction, and pain.   ACTIVITY LIMITATIONS: carrying, lifting, sleeping, bed mobility, bathing, toileting, dressing, reach over head, and hygiene/grooming  PARTICIPATION LIMITATIONS: meal prep, cleaning, laundry, interpersonal relationship, driving, shopping, community activity, and occupation  PERSONAL FACTORS:  History of stroke in Nov 2024 with Lt side involvement.   are also affecting patient's functional outcome.   REHAB POTENTIAL: Good  CLINICAL DECISION MAKING: Stable/uncomplicated  EVALUATION COMPLEXITY: Low   GOALS: Goals reviewed with patient? Yes  SHORT TERM GOALS: (target date for Short term goals are 3 weeks 12/22/2023)  1.Patient will demonstrate independent use of home exercise program to maintain progress from in clinic treatments. Goal status: on going 12/10/2023  LONG TERM GOALS: (target dates for all long term goals are 10 weeks  02/09/2024 )   1. Patient will demonstrate/report pain at worst less than or equal to 2/10 to facilitate minimal limitation in daily activity secondary to pain symptoms. Goal status: New   2. Patient will demonstrate independent use of home exercise program to facilitate ability to maintain/progress functional gains from skilled physical therapy services. Goal status: New   3. Patient will demonstrate Patient specific functional scale avg > or = 8/10 to indicate reduced disability due to condition.  Goal status: New   4.  Patient will demonstrate Lt shoulder  AROM WFL s symptoms to facilitate usual shoulder  movements for daily activity including driving, self care, dressing, work.    Goal status: New   5.  Patient will demonstrate Lt shoulder MMT > or = 5/5 for return to work at [employer].   Goal status: New   6.  Patient will demonstrate/report ability to lift for work s restriction due to symptoms.  Goal status: New   PLAN:  PT FREQUENCY: 1-2x/week  PT DURATION: 10 weeks  Can include 86578- PT Re-evaluation, 97110-Therapeutic exercises, 97530- Therapeutic activity, 97112- Neuromuscular re-education, 97535- Self Care, 97140- Manual therapy, [phone]- Gait training, [phone]- Orthotic Fit/training, [phone]- Canalith repositioning, [id number]- Aquatic Therapy, [phone] Electrical stimulation (unattended), [phone]- Electrical stimulation (manual), [id number]- Vasopneumatic device, [id number]- Ultrasound, [id number] Physical performance testing, [id number]- Traction (mechanical), [id number]- Ionotophoresis 4mg /ml Dexamethasone, Patient/Family education, Balance training, Stair training, Taping, Dry Needling, Joint mobilization, Joint manipulation, Spinal manipulation, Spinal mobilization, Scar mobilization, Vestibular training, Visual/preceptual remediation/compensation, DME instructions, Cryotherapy, and Moist heat.  All performed as medically necessary.  All included unless contraindicated  PLAN FOR NEXT SESSION: AAROM/AROM gains as able.    Bonna Bustard, PT, DPT, OCS, ATC 12/10/23  8:36 AM

## 2023-12-10 ENCOUNTER — Encounter: Payer: Self-pay | Admitting: Rehabilitative and Restorative Service Providers"

## 2023-12-10 ENCOUNTER — Ambulatory Visit: Payer: Self-pay | Admitting: Rehabilitative and Restorative Service Providers"

## 2023-12-10 DIAGNOSIS — M25512 Pain in left shoulder: Secondary | ICD-10-CM

## 2023-12-10 DIAGNOSIS — R293 Abnormal posture: Secondary | ICD-10-CM

## 2023-12-10 DIAGNOSIS — M6281 Muscle weakness (generalized): Secondary | ICD-10-CM

## 2023-12-10 DIAGNOSIS — M25612 Stiffness of left shoulder, not elsewhere classified: Secondary | ICD-10-CM

## 2023-12-10 DIAGNOSIS — G8929 Other chronic pain: Secondary | ICD-10-CM

## 2023-12-22 ENCOUNTER — Ambulatory Visit: Payer: Self-pay | Admitting: Rehabilitative and Restorative Service Providers"

## 2023-12-22 ENCOUNTER — Encounter: Payer: Self-pay | Admitting: Rehabilitative and Restorative Service Providers"

## 2023-12-22 DIAGNOSIS — M25512 Pain in left shoulder: Secondary | ICD-10-CM

## 2023-12-22 DIAGNOSIS — M6281 Muscle weakness (generalized): Secondary | ICD-10-CM

## 2023-12-22 DIAGNOSIS — M25612 Stiffness of left shoulder, not elsewhere classified: Secondary | ICD-10-CM

## 2023-12-22 DIAGNOSIS — G8929 Other chronic pain: Secondary | ICD-10-CM

## 2023-12-22 DIAGNOSIS — R293 Abnormal posture: Secondary | ICD-10-CM

## 2023-12-22 NOTE — Therapy (Signed)
 OUTPATIENT PHYSICAL THERAPY TREATMENT   Patient Name: Rebekah Mendez MRN: [id number] DOB:1961-04-07, 63 y.o., female Today's Date: 12/22/2023  END OF SESSION:  PT End of Session - 12/22/23 1430     Visit Number 3    Number of Visits 20    Date for PT Re-Evaluation 02/09/24    Authorization Type Self Pay    Progress Note Due on Visit 10    PT Start Time 1426    PT Stop Time 1506    PT Time Calculation (min) 40 min    Activity Tolerance Patient tolerated treatment well    Behavior During Therapy [facility] for tasks assessed/performed               Past Medical History:  Diagnosis Date   OSA (obstructive sleep apnea)    Thyroid activity decreased    late 20's   Past Surgical History:  Procedure Laterality Date   CESAREAN SECTION     Feb '89   EXCISIONAL HEMORRHOIDECTOMY     Patient Active Problem List   Diagnosis Date Noted   OSA on CPAP 11/04/2023   History of cerebrovascular accident (CVA) with residual deficit 07/06/2023   Lesion of skin of nose 07/06/2023   Coping style affecting medical condition 06/24/2023   Essential hypertension 06/17/2023   Tobacco use disorder 06/17/2023   Obesity 06/17/2023   Cerebrovascular accident (CVA) of right basal ganglia (HCC) 06/17/2023   Infarction of right basal ganglia (HCC) 06/13/2023    PCP: Lawrance Presume MD  REFERRING PROVIDER: Jasmine Mesi, MD  REFERRING DIAG: [phone] (ICD-10-CM) - Decreased range of motion of left shoulder  THERAPY DIAG:  Chronic left shoulder pain  Muscle weakness (generalized)  Stiffness of left shoulder, not elsewhere classified  Abnormal posture  Rationale for Evaluation and Treatment: Rehabilitation  ONSET DATE: March 2025  SUBJECTIVE:                                                                                                                                                                                                         SUBJECTIVE STATEMENT: Pt  indicated some days having pain some days and some days not as bad.  Pt indicated overall improvement at 65 %.   PERTINENT HISTORY:  History of stroke in Nov 2024 with Lt side involvement.   Reported getting "pretty close" on strength after stroke rehab.   PAIN:  NPRS scale: at worst in last few days:  5-6/10.  Pain at current 0/10 Pain location: Lt shoulder anterior/superior shoulder, sometimes into upper arm.  Pain  description: jabbing, sharp pain with movement Aggravating factors: lifting arm, reaching, jarring movements, sleeping.  Relieving factors: injection.   PRECAUTIONS: None  RED FLAGS: None  WEIGHT BEARING RESTRICTIONS: No  FALLS:  Has patient fallen in last 6 months? 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    PLOF: Independent, play with grandkids  Rt hand dominant  PATIENT GOALS: Reduce pain, improve arm movement.    OBJECTIVE:   PATIENT SURVEYS: Patient-Specific Activity Scoring Scheme  "0" represents "unable to perform." "10" represents "able to perform at prior level. 0 1 2 3 4 5 6 7 8 9  10 (Date and Score)   Activity Eval  12/01/2023    1. Lifting arm to side 4    2. Arm behind back  4    3. Lt arm overhead 4   4.    5.    Score 4 avg    Total score = sum of the activity scores/number of activities Minimum detectable change (90%CI) for average score = 2 points Minimum detectable change (90%CI) for single activity score = 3 points  COGNITION: 12/01/2023 Overall cognitive status: Within functional limits for tasks assessed  SENSATION: 12/01/2023 [facility]  POSTURE:  12/01/2023 Rounded shoulders.   PALPATION: 12/01/2023 Tenderness Lt shoulder superior, anterior, lateral.    UPPER EXTREMITY ROM:   ROM Right Eval 12/01/2023 Left Eval 12/01/2023 Left 12/10/2023  Shoulder flexion  110 120 AROM supine   Shoulder extension     Shoulder abduction  85   Shoulder  adduction     Shoulder extension     Shoulder internal rotation  60  in 45 deg abduction   Shoulder external rotation  30 in 45 deg abduction 43 in 45 deg abduction  Elbow flexion     Elbow extension                              Hand behind back T7 Lt PSIS with pain    (Blank rows = not tested)  UPPER EXTREMITY MMT:  MMT Right Eval 12/01/2023 Left Eval 12/01/2023 Left 12/22/2023  Shoulder flexion 5/5 4/5 c pain 4+/5  Shoulder extension     Shoulder abduction 5/5 4/5 c pain 4/5  Shoulder adduction     Shoulder extension     Shoulder internal rotation 5/5 3+/5 4/5  Shoulder external rotation 5/5 3+/5 c pain 4/5  Middle trapezius     Lower trapezius     Elbow flexion 5/5 4/5 c pain   Elbow extension 5/5 4/5   Wrist flexion     Wrist extension     Wrist ulnar deviation     Wrist radial deviation     Wrist pronation     Wrist supination     Grip strength      (Blank rows = not tested)  SPECIAL TESTS:  12/01/2023 (+) Shrug on Lt, (-) Drop arm  FUNCTIONAL TESTS:  12/01/2023 No specific testing today  TODAY'S TREATMENT:                                                                                                       DATE: 12/22/2023 Therex: Supine Lt shoulder AROM x 10 Sleeper stretch Lt 30 sec x 3 Hand behind back with strap stretch 30 sec x 3 Cross arm stretch 30 sec x 3 ER in doorway stretch 30 sec x 3 Additional time spent in verbal review of new intervention techniques.     Manual Supine Lt shoulder inferior glides g3 in various flexion, scaption ROM.  Posterior glide Lt GH joint with passive ER for mobilization c movement.  Percussive device to Lt posterior shoudler. Contract/relax for rotation mobility gains Lt shoulder.  Compression to infraspinatus trigger points.    TODAY'S  TREATMENT:                                                                                                       DATE: 12/10/2023 Therex: Side lying Lt shoulder ER AROM with towel under arm x 15 Side lying Lt shoulder abduction x 17 Side lying Lt shoulder flexion x 10  Seated scapular retraction 3 sec hold x 10  Tband rows green bilateral c scapular retraction focus x 15  TherActivity (to improve functional reaching, lifting overhead) Pulley flexion with eccentric lowering focus with outstretched arm 2-3 sec hold 3 mins, scaption same.   Education for use at home.    Manual Supine Lt shoulder inferior glides g3 in various flexion, scaption ROM.  Posterior glide Lt GH joint with passive ER for mobilization c movement.  Percussive device to Lt posterior shoudler.  TODAY'S TREATMENT:                                                                                                       DATE: 12/01/2023 Therex:    HEP instruction/performance c cues for techniques, handout provided.  Trial set performed of each for comprehension and symptom assessment.  See below for exercise list  Manual Supine Lt shoulder inferior glides g3 in various flexion, scaption ROM.  Posterior glide Lt GH joint with passive ER for mobilization c movement.    PATIENT EDUCATION:  12/10/2023 Education details: HEP update  Person educated: Patient Education method: Explanation, Demonstration, Verbal cues, and Handouts Education comprehension: verbalized understanding, returned demonstration, and verbal cues required  HOME EXERCISE PROGRAM: Access Code: [email or web address] URL: [email or web address] Date: 12/22/2023 Prepared by: Bonna Bustard  Exercises - Seated Scapular Retraction  - 3-5 x daily - 7 x weekly - 1 sets - 10 reps - 3-5 hold - Supine Shoulder Flexion Extension AAROM with Dowel  - 1-2 x daily - 7 x weekly - 1-2 sets - 10-15 reps - 3 hold - Seated Shoulder Flexion Self PROM  - 1-2 x daily - 7 x weekly  - 1-2 sets - 10 reps - Standing Shoulder Posterior Capsule Stretch  - 1-2 x daily - 7 x weekly - 1 sets - 3-5 reps - 15 hold - Sleeper Stretch  - 2 x daily - 7 x weekly - 1 sets - 5 reps - 30 hold - Single Arm Doorway Pec Stretch at 90 Degrees Abduction  - 2 x daily - 7 x weekly - 1 sets - 5 reps - 30 hold - Standing Shoulder Internal Rotation Stretch with Towel (Mirrored)  - 2 x daily - 7 x weekly - 1 sets - 5 reps - 30 hold - Sidelying Shoulder Abduction Palm Forward (Mirrored)  - 1-2 x daily - 7 x weekly - 2-3 sets - 10-15 reps - Sidelying Shoulder External Rotation  - 1-2 x daily - 7 x weekly - 2-3 sets - 10-15 reps - Sidelying Shoulder Flexion 15 Degrees  - 1-2 x daily - 7 x weekly - 2-3 sets - 10-15 reps  ASSESSMENT:  CLINICAL IMPRESSION: Some mild gains in mobility were noted today.  Rotation mobility still most limited in clinical presentation today.  Discussed posterior rotator cuff trigger points/tightness as factor in mobility.  Positive reduction in anterior shoulder discomfort with cross arm stretch after trigger point release treatment to Lt shoulder.  Continued skilled PT services indicated at this time.    OBJECTIVE IMPAIRMENTS: decreased activity tolerance, decreased coordination, decreased endurance, decreased mobility, decreased ROM, decreased strength, hypomobility, increased fascial restrictions, impaired perceived functional ability, increased muscle spasms, impaired flexibility, impaired UE functional use, improper body mechanics, postural dysfunction, and pain.   ACTIVITY LIMITATIONS: carrying, lifting, sleeping, bed mobility, bathing, toileting, dressing, reach over head, and hygiene/grooming  PARTICIPATION LIMITATIONS: meal prep, cleaning, laundry, interpersonal relationship, driving, shopping, community activity, and occupation  PERSONAL FACTORS: History of stroke in Nov 2024 with Lt side involvement.  are also affecting patient's functional outcome.   REHAB  POTENTIAL: Good  CLINICAL DECISION MAKING: Stable/uncomplicated  EVALUATION COMPLEXITY: Low   GOALS: Goals reviewed with patient? Yes  SHORT TERM GOALS: (target date for Short term goals are 3 weeks 12/22/2023)  1.Patient will demonstrate independent use of home exercise program to maintain progress from in clinic treatments. Goal status: Met  LONG TERM GOALS: (target dates for all long term goals are 10 weeks  02/09/2024 )   1. Patient will demonstrate/report pain at worst less than or equal to 2/10 to facilitate minimal limitation in daily activity secondary to pain symptoms. Goal status: on going 12/22/2023   2. Patient will demonstrate independent use of home exercise program to facilitate ability to maintain/progress functional gains from skilled physical therapy services. Goal status: on going 12/22/2023   3. Patient will demonstrate Patient specific functional scale avg > or = 8/10 to indicate reduced disability due to condition.  Goal status: on going 12/22/2023   4.  Patient will demonstrate Lt shoulder  AROM  WFL s symptoms to facilitate usual shoulder  movements for daily activity including driving, self care, dressing, work.   Goal status: on going 12/22/2023   5.  Patient will demonstrate Lt shoulder MMT > or = 5/5 for return to work at [employer].   Goal status: on going 12/22/2023   6.  Patient will demonstrate/report ability to lift for work s restriction due to symptoms.  Goal status: on going 12/22/2023   PLAN:  PT FREQUENCY: 1-2x/week  PT DURATION: 10 weeks  Can include 40981- PT Re-evaluation, 97110-Therapeutic exercises, 97530- Therapeutic activity, 97112- Neuromuscular re-education, 97535- Self Care, 97140- Manual therapy, [phone]- Gait training, [phone]- Orthotic Fit/training, [phone]- Canalith repositioning, [id number]- Aquatic Therapy, [phone] Electrical stimulation (unattended), [phone]- Electrical stimulation (manual), [id number]- Vasopneumatic device, [id number]- Ultrasound, [id number]  Physical performance testing, [id number]- Traction (mechanical), [id number]- Ionotophoresis 4mg /ml Dexamethasone, Patient/Family education, Balance training, Stair training, Taping, Dry Needling, Joint mobilization, Joint manipulation, Spinal manipulation, Spinal mobilization, Scar mobilization, Vestibular training, Visual/preceptual remediation/compensation, DME instructions, Cryotherapy, and Moist heat.  All performed as medically necessary.  All included unless contraindicated  PLAN FOR NEXT SESSION: How were new stretching in HEP.     Bonna Bustard, PT, DPT, OCS, ATC 12/22/23  3:11 PM

## 2023-12-31 ENCOUNTER — Ambulatory Visit (INDEPENDENT_AMBULATORY_CARE_PROVIDER_SITE_OTHER): Payer: Self-pay | Admitting: Orthopedic Surgery

## 2023-12-31 ENCOUNTER — Encounter: Payer: Self-pay | Admitting: Orthopedic Surgery

## 2023-12-31 ENCOUNTER — Encounter: Payer: Self-pay | Admitting: Physical Therapy

## 2023-12-31 DIAGNOSIS — M7502 Adhesive capsulitis of left shoulder: Secondary | ICD-10-CM

## 2023-12-31 NOTE — Progress Notes (Signed)
 Office Visit Note   Patient: Rebekah Mendez           Date of Birth: [birth year]           MRN: [id number] Visit Date: 12/31/2023 Requested by: Lawrance Presume, MD [address] [place],  [place] [address] PCP: Lawrance Presume, MD  Subjective: Chief Complaint  Patient presents with   Left Shoulder - Follow-up    HPI: Rebekah Mendez is a 63 y.o. female who presents to the office reporting continued left shoulder pain.  Here for 6-week follow-up after glenohumeral joint injection for 725.  She states that the shot helped her a little bit.  She has done physical therapy once along with a home exercise program..                ROS: All systems reviewed are negative as they relate to the chief complaint within the history of present illness.  Patient denies fevers or chills.  Assessment & Plan: Visit Diagnoses: No diagnosis found.  Plan: Impression is left frozen shoulder with slight increase in passive range of motion.  No arthritis on radiographs.  Overall the left arm has a little bit of generalized weakness consistent with her diagnosis of stroke.  Plan at this time is to be talked about the natural history of frozen shoulder.  She wants to try to work this out on her own which I think is fine.  Might take about a year for her to get motion back in that shoulder.  Talked about arthroscopic manipulation and debridement with CPM machine to try to set short-circuit that length the natural history.  We can do that in the future if her symptoms worsen.  She will follow-up as needed.  Follow-Up Instructions: No follow-ups on file.   Orders:  No orders of the defined types were placed in this encounter.  No orders of the defined types were placed in this encounter.     Procedures: No procedures performed   Clinical Data: No additional findings.  Objective: Vital Signs: There were no vitals taken for this visit.  Physical Exam:  Constitutional:  Patient appears well-developed HEENT:  Head: Normocephalic Eyes:EOM are normal Neck: Normal range of motion Cardiovascular: Normal rate Pulmonary/chest: Effort normal Neurologic: Patient is alert Skin: Skin is warm Psychiatric: Patient has normal mood and affect  Ortho Exam: Ortho exam demonstrates range of motion on the left of 20/60/85.  Rotator cuff strength is slightly diminished with external rotation on the left compared to the right but there is no coarseness or grinding with passive range of motion.  In general she has slightly less grip strength biceps and tricep strength as well as external rotation and deltoid strength on the left compared to the right likely due to her stroke.  Specialty Comments:  No specialty comments available.  Imaging: No results found.   PMFS History: Patient Active Problem List   Diagnosis Date Noted   OSA on CPAP 11/04/2023   History of cerebrovascular accident (CVA) with residual deficit 07/06/2023   Lesion of skin of nose 07/06/2023   Coping style affecting medical condition 06/24/2023   Essential hypertension 06/17/2023   Tobacco use disorder 06/17/2023   Obesity 06/17/2023   Cerebrovascular accident (CVA) of right basal ganglia (HCC) 06/17/2023   Infarction of right basal ganglia (HCC) 06/13/2023   Past Medical History:  Diagnosis Date   OSA (obstructive sleep apnea)    Thyroid activity decreased  late 20's    Family History  Problem Relation Age of Onset   Colon cancer Neg Hx     Past Surgical History:  Procedure Laterality Date   CESAREAN SECTION     Feb '89   EXCISIONAL HEMORRHOIDECTOMY     Social History   Occupational History   Not on file  Tobacco Use   Smoking status: Some Days   Smokeless tobacco: Never  Substance and Sexual Activity   Alcohol use: Yes    Comment: rare   Drug use: No   Sexual activity: Not on file

## 2024-01-07 ENCOUNTER — Encounter: Payer: Self-pay | Admitting: Physical Therapy

## 2024-01-08 ENCOUNTER — Other Ambulatory Visit (HOSPITAL_COMMUNITY)
Admission: RE | Admit: 2024-01-08 | Discharge: 2024-01-08 | Disposition: A | Discharge status: Home / Self Care | Source: Ambulatory Visit | Attending: Internal Medicine | Admitting: Internal Medicine

## 2024-01-08 ENCOUNTER — Encounter: Payer: Self-pay | Admitting: Internal Medicine

## 2024-01-08 ENCOUNTER — Ambulatory Visit: Payer: Self-pay | Attending: Internal Medicine | Admitting: Internal Medicine

## 2024-01-08 VITALS — BP 114/76 | HR 66 | Ht 64.0 in | Wt 208.0 lb

## 2024-01-08 DIAGNOSIS — Z124 Encounter for screening for malignant neoplasm of cervix: Secondary | ICD-10-CM | POA: Insufficient documentation

## 2024-01-08 DIAGNOSIS — Z1231 Encounter for screening mammogram for malignant neoplasm of breast: Secondary | ICD-10-CM

## 2024-01-08 DIAGNOSIS — M7502 Adhesive capsulitis of left shoulder: Secondary | ICD-10-CM

## 2024-01-08 NOTE — Progress Notes (Signed)
 Patient ID: Rebekah Mendez, female    DOB: 11/14/1960  MRN: [id number]  CC: Gynecologic Exam (Pap. /)   Subjective: Rebekah Mendez is a 63 y.o. female who presents for PAP Her concerns today include:  Patient with history of tob dep quit post CVA, HTN, HL, right basal ganglia CVA 06/2023 with residual LUE weakness    GYN History:  Pt is G3P3 Any hx of abn paps?: one req crytho Menses regular or irregular?: postmenopausal How long does menses last? NA Menstrual flow light or heavy?: NA Method of birth control?:  postmenopausal Any vaginal dischg at this time?: no Dysuria?: no Any hx of STI?: no Sexually active with how many partners:  Desires STI screen: no Last MMG: over due for MMG, agreeable to having this scheduled Family hx of uterine, cervical or breast cancer?:  no  Since last visit with me she has seen Dr. Rozelle Corning for her left shoulder.  Diagnosed with adhesive capsulitis.  Given a steroid injection which helped a little.  Referred for some physical therapy.  On follow-up with him, discussion was had of possible surgical intervention but patient does not want to go that route for now.  States that she will continue to work with shoulder movement at home.  Patient Active Problem List   Diagnosis Date Noted   OSA on CPAP 11/04/2023   History of cerebrovascular accident (CVA) with residual deficit 07/06/2023   Lesion of skin of nose 07/06/2023   Coping style affecting medical condition 06/24/2023   Essential hypertension 06/17/2023   Tobacco use disorder 06/17/2023   Obesity 06/17/2023   Cerebrovascular accident (CVA) of right basal ganglia (HCC) 06/17/2023   Infarction of right basal ganglia (HCC) 06/13/2023     Current Outpatient Medications on File Prior to Visit  Medication Sig Dispense Refill   amLODipine  (NORVASC ) 10 MG tablet TAKE 1 TABLET BY MOUTH EVERY DAY 90 tablet 1   aspirin  EC 81 MG tablet Take 1 tablet (81 mg total) by mouth daily. Swallow whole.  100 tablet 0   lisinopril  (ZESTRIL ) 2.5 MG tablet Take 1 tablet (2.5 mg total) by mouth daily. 90 tablet 1   melatonin 3 MG TABS tablet Take 1 tablet (3 mg total) by mouth at bedtime. 30 tablet 0   rosuvastatin  (CRESTOR ) 20 MG tablet Take 1 tablet (20 mg total) by mouth daily. 90 tablet 1   senna-docusate (SENOKOT-S) 8.6-50 MG tablet Take 1 tablet by mouth at bedtime. (Patient not taking: Reported on 01/08/2024)     No current facility-administered medications on file prior to visit.    No Known Allergies  Social History   Socioeconomic History   Marital status: Married    Spouse name: Not on file   Number of children: Not on file   Years of education: Not on file   Highest education level: Bachelor's degree (e.g., BA, AB, BS)  Occupational History   Not on file  Tobacco Use   Smoking status: Some Days   Smokeless tobacco: Never  Substance and Sexual Activity   Alcohol use: Yes    Comment: rare   Drug use: No   Sexual activity: Not on file  Other Topics Concern   Not on file  Social History Narrative   Not on file   Social Drivers of Health   Financial Resource Strain: Low Risk  (01/07/2024)   Overall Financial Resource Strain (CARDIA)    Difficulty of Paying Living Expenses: Not hard at all  Food Insecurity:  No Food Insecurity (01/07/2024)   Hunger Vital Sign    Worried About Running Out of Food in the Last Year: Never true    Ran Out of Food in the Last Year: Never true  Transportation Needs: No Transportation Needs (01/07/2024)   PRAPARE - Administrator, Civil Service (Medical): No    Lack of Transportation (Non-Medical): No  Physical Activity: Inactive (01/07/2024)   Exercise Vital Sign    Days of Exercise per Week: 0 days    Minutes of Exercise per Session: 0 min  Stress: No Stress Concern Present (01/07/2024)   [employer] of Occupational Health - Occupational Stress Questionnaire    Feeling of Stress : Not at all  Social Connections: Socially  Integrated (01/07/2024)   Social Connection and Isolation Panel [NHANES]    Frequency of Communication with Friends and Family: More than three times a week    Frequency of Social Gatherings with Friends and Family: Once a week    Attends Religious Services: More than 4 times per year    Active Member of [employer] or Organizations: Yes    Attends Banker Meetings: More than 4 times per year    Marital Status: Married  Catering manager Violence: Not At Risk (11/04/2023)   Humiliation, Afraid, Rape, and Kick questionnaire    Fear of Current or Ex-Partner: No    Emotionally Abused: No    Physically Abused: No    Sexually Abused: No    Family History  Problem Relation Age of Onset   Colon cancer Neg Hx     Past Surgical History:  Procedure Laterality Date   CESAREAN SECTION     Feb '89   EXCISIONAL HEMORRHOIDECTOMY      ROS: Review of Systems Negative except as stated above  PHYSICAL EXAM: BP 114/76 (BP Location: Left Arm, Patient Position: Sitting, Cuff Size: Normal)   Pulse 66   Ht 5\' 4"  (1.626 m)   Wt 208 lb (94.3 kg)   SpO2 98%   BMI 35.70 kg/m   Physical Exam  General appearance - alert, well appearing, and in no distress Mental status - normal mood, behavior, speech, dress, motor activity, and thought processes Pelvic - CMA Clarisa present: normal external genitalia, vulva, vagina, cervix, uterus and adnexa      Latest Ref Rng & Units 06/24/2023    5:01 AM 06/18/2023    5:18 AM 06/17/2023    4:52 AM  CMP  Glucose 70 - 99 mg/dL  086  578   BUN 6 - 20 mg/dL  14  14   Creatinine 4.69 - 1.00 mg/dL 6.29  5.28  4.13   Sodium 135 - 145 mmol/L  139  138   Potassium 3.5 - 5.1 mmol/L  3.7  3.5   Chloride 98 - 111 mmol/L  105  106   CO2 22 - 32 mmol/L  24  23   Calcium  8.9 - 10.3 mg/dL  8.7  8.5   Total Protein 6.5 - 8.1 g/dL  6.2    Total Bilirubin <1.2 mg/dL  0.5    Alkaline Phos 38 - 126 U/L  61    AST 15 - 41 U/L  30    ALT 0 - 44 U/L  26     Lipid  Panel     Component Value Date/Time   CHOL 178 06/14/2023 0220   TRIG 78 06/14/2023 0220   HDL 51 06/14/2023 0220   CHOLHDL 3.5 06/14/2023 0220  VLDL 16 06/14/2023 0220   LDLCALC 111 (H) 06/14/2023 0220    CBC    Component Value Date/Time   WBC 6.3 06/18/2023 0518   RBC 4.46 06/18/2023 0518   HGB 12.2 06/18/2023 0518   HCT 38.0 06/18/2023 0518   PLT 256 06/18/2023 0518   MCV 85.2 06/18/2023 0518   MCH 27.4 06/18/2023 0518   MCHC 32.1 06/18/2023 0518   RDW 12.9 06/18/2023 0518   LYMPHSABS 2.2 06/18/2023 0518   MONOABS 0.5 06/18/2023 0518   EOSABS 0.2 06/18/2023 0518   BASOSABS 0.1 06/18/2023 0518    ASSESSMENT AND PLAN: 1. Pap smear for cervical cancer screening (Primary) - Cytology - PAP  2. Encounter for screening mammogram for malignant neoplasm of breast - MM 3D SCREENING MAMMOGRAM BILATERAL BREAST; Future  3. Adhesive capsulitis of left shoulder Patient has seen orthopedics.  She reports slight improvement with steroid injections and physical therapy.  Does not wish to explore surgical option.    Patient was given the opportunity to ask questions.  Patient verbalized understanding of the plan and was able to repeat key elements of the plan.   This documentation was completed using Paediatric nurse.  Any transcriptional errors are unintentional.  Orders Placed This Encounter  Procedures   MM 3D SCREENING MAMMOGRAM BILATERAL BREAST     Requested Prescriptions    No prescriptions requested or ordered in this encounter    Return in about 6 months (around 07/10/2024).  Concetta Dee, MD, FACP

## 2024-01-14 ENCOUNTER — Encounter: Payer: Self-pay | Admitting: Rehabilitative and Restorative Service Providers"

## 2024-01-14 ENCOUNTER — Ambulatory Visit: Payer: Self-pay | Admitting: Rehabilitative and Restorative Service Providers"

## 2024-01-14 DIAGNOSIS — M25612 Stiffness of left shoulder, not elsewhere classified: Secondary | ICD-10-CM

## 2024-01-14 DIAGNOSIS — M6281 Muscle weakness (generalized): Secondary | ICD-10-CM

## 2024-01-14 DIAGNOSIS — M25512 Pain in left shoulder: Secondary | ICD-10-CM

## 2024-01-14 DIAGNOSIS — R293 Abnormal posture: Secondary | ICD-10-CM

## 2024-01-14 DIAGNOSIS — G8929 Other chronic pain: Secondary | ICD-10-CM

## 2024-01-14 NOTE — Therapy (Signed)
 OUTPATIENT PHYSICAL THERAPY TREATMENT   Patient Name: Rebekah Mendez MRN: [id number] DOB:01/22/1961, 63 y.o., female Today's Date: 01/14/2024  END OF SESSION:  PT End of Session - 01/14/24 1428     Visit Number 4    Number of Visits 20    Date for PT Re-Evaluation 02/09/24    Authorization Type Self Pay    Progress Note Due on Visit 10    PT Start Time 1427    PT Stop Time 1513    PT Time Calculation (min) 46 min    Activity Tolerance Patient limited by pain    Behavior During Therapy [facility] for tasks assessed/performed                Past Medical History:  Diagnosis Date   OSA (obstructive sleep apnea)    Thyroid activity decreased    late 20's   Past Surgical History:  Procedure Laterality Date   CESAREAN SECTION     Feb '89   EXCISIONAL HEMORRHOIDECTOMY     Patient Active Problem List   Diagnosis Date Noted   OSA on CPAP 11/04/2023   History of cerebrovascular accident (CVA) with residual deficit 07/06/2023   Lesion of skin of nose 07/06/2023   Coping style affecting medical condition 06/24/2023   Essential hypertension 06/17/2023   Tobacco use disorder 06/17/2023   Obesity 06/17/2023   Cerebrovascular accident (CVA) of right basal ganglia (HCC) 06/17/2023   Infarction of right basal ganglia (HCC) 06/13/2023    PCP: Lawrance Presume MD  REFERRING PROVIDER: Jasmine Mesi, MD  REFERRING DIAG: [phone] (ICD-10-CM) - Decreased range of motion of left shoulder  THERAPY DIAG:  Chronic left shoulder pain  Muscle weakness (generalized)  Stiffness of left shoulder, not elsewhere classified  Abnormal posture  Rationale for Evaluation and Treatment: Rehabilitation  ONSET DATE: March 2025  SUBJECTIVE:                                                                                                                                                                                                         SUBJECTIVE STATEMENT: Pt indicated  having hurting in shoulder and feeling sore in muscle. Reported feeling maybe some motion.   Pt indicated having some improvement in use of arm but still having complaints with lifting activity and reaching behind back/head continues to be limited and painful.   PERTINENT HISTORY:  History of stroke in Nov 2024 with Lt side involvement.   Reported getting "Rebekah close" on strength after stroke rehab.   PAIN:  NPRS scale: no pain  at rest, reported pain at most in last week:  7-8/10 Pain location: Lt shoulder anterior/superior shoulder, sometimes into upper arm.  Pain description: jabbing, sharp pain with movement Aggravating factors: lifting arm, reaching, jarring movements, sleeping.  Relieving factors: injection.   PRECAUTIONS: None  RED FLAGS: None  WEIGHT BEARING RESTRICTIONS: No  FALLS:  Has patient fallen in last 6 months? 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    PLOF: Independent, play with grandkids  Rt hand dominant  PATIENT GOALS: Reduce pain, improve arm movement.    OBJECTIVE:   PATIENT SURVEYS: Patient-Specific Activity Scoring Scheme  "0" represents "unable to perform." "10" represents "able to perform at prior level. 0 1 2 3 4 5 6 7 8 9  10 (Date and Score)   Activity Eval  12/01/2023    1. Lifting arm to side 4    2. Arm behind back  4    3. Lt arm overhead 4   4.    5.    Score 4 avg    Total score = sum of the activity scores/number of activities Minimum detectable change (90%CI) for average score = 2 points Minimum detectable change (90%CI) for single activity score = 3 points  COGNITION: 12/01/2023 Overall cognitive status: Within functional limits for tasks assessed  SENSATION: 12/01/2023 [facility]  POSTURE:  12/01/2023 Rounded shoulders.   PALPATION: 12/01/2023 Tenderness Lt shoulder superior, anterior, lateral.    UPPER EXTREMITY ROM:   ROM  Right Eval 12/01/2023 Left Eval 12/01/2023 Left 12/10/2023 Left 01/14/2024  Shoulder flexion  110 120 AROM supine  134 AROM c pain supine   Shoulder extension      Shoulder abduction  85  91 c pain  Shoulder adduction      Shoulder extension      Shoulder internal rotation  60  in 45 deg abduction  70 in 45 deg abduction c pain  Shoulder external rotation  30 in 45 deg abduction 43 in 45 deg abduction 39  in 45 deg abduction c pain  46 after manual  Elbow flexion      Elbow extension                                    Hand behind back T7 Lt PSIS with pain     (Blank rows = not tested)  UPPER EXTREMITY MMT:  MMT Right Eval 12/01/2023 Left Eval 12/01/2023 Left 12/22/2023  Shoulder flexion 5/5 4/5 c pain 4+/5  Shoulder extension     Shoulder abduction 5/5 4/5 c pain 4/5  Shoulder adduction     Shoulder extension     Shoulder internal rotation 5/5 3+/5 4/5  Shoulder external rotation 5/5 3+/5 c pain 4/5  Middle trapezius     Lower trapezius     Elbow flexion 5/5 4/5 c pain   Elbow extension 5/5 4/5   Wrist flexion     Wrist extension     Wrist ulnar deviation     Wrist radial deviation     Wrist pronation     Wrist supination     Grip strength      (Blank rows = not tested)  SPECIAL TESTS:  12/01/2023 (+) Shrug on Lt, (-) Drop arm  FUNCTIONAL TESTS:  12/01/2023 No specific testing today  TODAY'S TREATMENT:                                                                                                       DATE: 01/14/2024 Therex: Supine Lt shoulder AROM ER/IR alternating 5 sec hold 1 lb x 10 each way (instruction for home use) Supine cross arm stretch 15 sec x 3 Lt Review of existing HEP for techniques and encouragement for long duration low load stretching focus with consistency.    TherActivity Pulley flexion with eccentric lowering focus with outstretched arm 2-3 sec hold 3 mins, scaption same.  Hand behind back 10 sec hold x 10 with pulleys.   Manual Supine Lt shoulder inferior glides g3 in various flexion, scaption ROM.  Posterior glide Lt GH joint with passive ER for mobilization c movement.  Percussive device to Lt posterior shoudler. Contract/relax for rotation mobility gains Lt shoulder.  Compression to infraspinatus trigger points.   TODAY'S TREATMENT:                                                                                                       DATE: 12/22/2023 Therex: Supine Lt shoulder AROM x 10 Sleeper stretch Lt 30 sec x 3 Hand behind back with strap stretch 30 sec x 3 Cross arm stretch 30 sec x 3 ER in doorway stretch 30 sec x 3 Additional time spent in verbal review of new intervention techniques.   Manual Supine Lt shoulder inferior glides g3 in various flexion, scaption ROM.  Posterior glide Lt GH joint with passive ER for mobilization c movement.  Percussive device to Lt posterior shoudler. Contract/relax for rotation mobility gains Lt shoulder.  Compression to infraspinatus trigger points.    TODAY'S TREATMENT:                                                                                                       DATE: 12/10/2023 Therex: Side lying Lt shoulder ER AROM with towel under arm x 15 Side lying Lt shoulder abduction x 17 Side lying Lt shoulder flexion x 10  Seated scapular retraction 3 sec hold x 10  Tband rows green bilateral c scapular retraction focus x 15  TherActivity (to improve functional reaching, lifting overhead) Pulley flexion  with eccentric lowering focus with outstretched arm 2-3 sec hold 3 mins, scaption same.   Education for use at home.    Manual Supine Lt shoulder inferior glides g3 in various flexion, scaption ROM.  Posterior glide Lt GH joint with passive ER for mobilization c movement.  Percussive device to  Lt posterior shoudler.  TODAY'S TREATMENT:                                                                                                       DATE: 12/01/2023 Therex:    HEP instruction/performance c cues for techniques, handout provided.  Trial set performed of each for comprehension and symptom assessment.  See below for exercise list  Manual Supine Lt shoulder inferior glides g3 in various flexion, scaption ROM.  Posterior glide Lt GH joint with passive ER for mobilization c movement.    PATIENT EDUCATION:  01/14/2024 Education details: HEP update Person educated: Patient Education method: Programmer, multimedia, Demonstration, Verbal cues, and Handouts Education comprehension: verbalized understanding, returned demonstration, and verbal cues required  HOME EXERCISE PROGRAM: Access Code: [email or web address] URL: [email or web address] Date: 01/14/2024 Prepared by: Bonna Bustard  Exercises - Seated Scapular Retraction  - 3-5 x daily - 7 x weekly - 1 sets - 10 reps - 3-5 hold - Supine Shoulder Flexion Extension AAROM with Dowel  - 1-2 x daily - 7 x weekly - 1-2 sets - 10-15 reps - 3 hold - Seated Shoulder Flexion Self PROM  - 1-2 x daily - 7 x weekly - 1-2 sets - 10 reps - Standing Shoulder Posterior Capsule Stretch  - 1-2 x daily - 7 x weekly - 1 sets - 3-5 reps - 15 hold - Sleeper Stretch  - 2 x daily - 7 x weekly - 1 sets - 5 reps - 30 hold - Single Arm Doorway Pec Stretch at 90 Degrees Abduction  - 2 x daily - 7 x weekly - 1 sets - 5 reps - 30 hold - Standing Shoulder Internal Rotation Stretch with Towel (Mirrored)  - 2 x daily - 7 x weekly - 1 sets - 5 reps - 30 hold - Sidelying Shoulder Abduction Palm Forward (Mirrored)  - 1-2 x daily - 7 x weekly - 2-3 sets - 10-15 reps - Sidelying Shoulder External Rotation  - 1-2 x daily - 7 x weekly - 2-3 sets - 10-15 reps - Sidelying Shoulder Flexion 15 Degrees  - 1-2 x daily - 7 x weekly - 2-3 sets - 10-15 reps - Supine Shoulder External  Rotation with Dumbbell (Mirrored)  - 1 x daily - 7 x weekly - 1 sets - 10 reps - 5-10 hold - Supine Shoulder External and Internal Rotation in Abduction with Dumbbell (Mirrored)  - 1 x daily - 7 x weekly - 1 sets - 10 reps - 5-10 hold  ASSESSMENT:  CLINICAL IMPRESSION: Capsular pattern of tightness/pain still noted but some improvement in tolerated active range was noted.  Continued emphasis in HEP and in clinic on longer duration stretching.  May continue to benefit from continued skilled PT services.  Provided education about adhesive capsulitis timeline presentation.    OBJECTIVE IMPAIRMENTS: decreased activity tolerance, decreased coordination, decreased endurance, decreased mobility, decreased ROM, decreased strength, hypomobility, increased fascial restrictions, impaired perceived functional ability, increased muscle spasms, impaired flexibility, impaired UE functional use, improper body mechanics, postural dysfunction, and pain.   ACTIVITY LIMITATIONS: carrying, lifting, sleeping, bed mobility, bathing, toileting, dressing, reach over head, and hygiene/grooming  PARTICIPATION LIMITATIONS: meal prep, cleaning, laundry, interpersonal relationship, driving, shopping, community activity, and occupation  PERSONAL FACTORS: History of stroke in Nov 2024 with Lt side involvement.  are also affecting patient's functional outcome.   REHAB POTENTIAL: Good  CLINICAL DECISION MAKING: Stable/uncomplicated  EVALUATION COMPLEXITY: Low   GOALS: Goals reviewed with patient? Yes  SHORT TERM GOALS: (target date for Short term goals are 3 weeks 12/22/2023)  1.Patient will demonstrate independent use of home exercise program to maintain progress from in clinic treatments. Goal status: Met  LONG TERM GOALS: (target dates for all long term goals are 10 weeks  02/09/2024 )   1. Patient will demonstrate/report pain at worst less than or equal to 2/10 to facilitate minimal limitation in daily activity  secondary to pain symptoms. Goal status: on going 12/22/2023   2. Patient will demonstrate independent use of home exercise program to facilitate ability to maintain/progress functional gains from skilled physical therapy services. Goal status: on going 12/22/2023   3. Patient will demonstrate Patient specific functional scale avg > or = 8/10 to indicate reduced disability due to condition.  Goal status: on going 12/22/2023   4.  Patient will demonstrate Lt shoulder  AROM WFL s symptoms to facilitate usual shoulder  movements for daily activity including driving, self care, dressing, work.   Goal status: on going 12/22/2023   5.  Patient will demonstrate Lt shoulder MMT > or = 5/5 for return to work at [employer].   Goal status: on going 12/22/2023   6.  Patient will demonstrate/report ability to lift for work s restriction due to symptoms.  Goal status: on going 12/22/2023   PLAN:  PT FREQUENCY: 1-2x/week  PT DURATION: 10 weeks  Can include 16109- PT Re-evaluation, 97110-Therapeutic exercises, 97530- Therapeutic activity, 97112- Neuromuscular re-education, 97535- Self Care, 97140- Manual therapy, [phone]- Gait training, [phone]- Orthotic Fit/training, [phone]- Canalith repositioning, [id number]- Aquatic Therapy, [phone] Electrical stimulation (unattended), [phone]- Electrical stimulation (manual), [id number]- Vasopneumatic device, [id number]- Ultrasound, [id number] Physical performance testing, [id number]- Traction (mechanical), [id number]- Ionotophoresis 4mg /ml Dexamethasone, Patient/Family education, Balance training, Stair training, Taping, Dry Needling, Joint mobilization, Joint manipulation, Spinal manipulation, Spinal mobilization, Scar mobilization, Vestibular training, Visual/preceptual remediation/compensation, DME instructions, Cryotherapy, and Moist heat.  All performed as medically necessary.  All included unless contraindicated  PLAN FOR NEXT SESSION: Continue to progress mobility as able.   Bonna Bustard, PT, DPT, OCS,  ATC 01/14/24  3:18 PM

## 2024-01-15 ENCOUNTER — Ambulatory Visit: Payer: Self-pay | Admitting: Nurse Practitioner

## 2024-01-15 LAB — CYTOLOGY - PAP
Comment: NEGATIVE
Diagnosis: NEGATIVE
Diagnosis: REACTIVE
High risk HPV: NEGATIVE

## 2024-01-16 ENCOUNTER — Ambulatory Visit
Admission: RE | Admit: 2024-01-16 | Discharge: 2024-01-16 | Disposition: A | Discharge status: Home / Self Care | Payer: Self-pay | Source: Ambulatory Visit | Attending: Internal Medicine | Admitting: Internal Medicine

## 2024-01-16 ENCOUNTER — Ambulatory Visit: Payer: Self-pay

## 2024-01-16 DIAGNOSIS — Z1231 Encounter for screening mammogram for malignant neoplasm of breast: Secondary | ICD-10-CM

## 2024-01-19 ENCOUNTER — Encounter: Payer: Self-pay | Admitting: Rehabilitative and Restorative Service Providers"

## 2024-01-26 ENCOUNTER — Ambulatory Visit (INDEPENDENT_AMBULATORY_CARE_PROVIDER_SITE_OTHER): Payer: Self-pay | Admitting: Rehabilitative and Restorative Service Providers"

## 2024-01-26 ENCOUNTER — Encounter: Payer: Self-pay | Admitting: Rehabilitative and Restorative Service Providers"

## 2024-01-26 DIAGNOSIS — R293 Abnormal posture: Secondary | ICD-10-CM

## 2024-01-26 DIAGNOSIS — G8929 Other chronic pain: Secondary | ICD-10-CM

## 2024-01-26 DIAGNOSIS — M25612 Stiffness of left shoulder, not elsewhere classified: Secondary | ICD-10-CM

## 2024-01-26 DIAGNOSIS — M25512 Pain in left shoulder: Secondary | ICD-10-CM

## 2024-01-26 DIAGNOSIS — M6281 Muscle weakness (generalized): Secondary | ICD-10-CM

## 2024-01-26 NOTE — Therapy (Signed)
 OUTPATIENT PHYSICAL THERAPY TREATMENT   Patient Name: Rebekah Mendez MRN: [id number] DOB:Sep 17, 1960, 63 y.o., female Today's Date: 01/26/2024  END OF SESSION:  PT End of Session - 01/26/24 0850     Visit Number 5    Number of Visits 20    Date for PT Re-Evaluation 02/09/24    Authorization Type Self Pay    Progress Note Due on Visit 10    PT Start Time 0845    PT Stop Time 0924    PT Time Calculation (min) 39 min    Activity Tolerance Patient limited by pain    Behavior During Therapy [facility] for tasks assessed/performed              Past Medical History:  Diagnosis Date   OSA (obstructive sleep apnea)    Thyroid activity decreased    late 20's   Past Surgical History:  Procedure Laterality Date   CESAREAN SECTION     Feb '89   EXCISIONAL HEMORRHOIDECTOMY     Patient Active Problem List   Diagnosis Date Noted   OSA on CPAP 11/04/2023   History of cerebrovascular accident (CVA) with residual deficit 07/06/2023   Lesion of skin of nose 07/06/2023   Coping style affecting medical condition 06/24/2023   Essential hypertension 06/17/2023   Tobacco use disorder 06/17/2023   Obesity 06/17/2023   Cerebrovascular accident (CVA) of right basal ganglia (HCC) 06/17/2023   Infarction of right basal ganglia (HCC) 06/13/2023    PCP: Lawrance Presume MD  REFERRING PROVIDER: Jasmine Mesi, MD  REFERRING DIAG: [phone] (ICD-10-CM) - Decreased range of motion of left shoulder  THERAPY DIAG:  Chronic left shoulder pain  Muscle weakness (generalized)  Stiffness of left shoulder, not elsewhere classified  Abnormal posture  Rationale for Evaluation and Treatment: Rehabilitation  ONSET DATE: March 2025  SUBJECTIVE:                                                                                                                                                                                                         SUBJECTIVE STATEMENT: Pt indicated having  a consistent pain at rest and with movement.  Noted soreness upon waking in morning.  Pt indicated going to a pool this weekend and felt some variable pains with quick movements in pool and soreness afterward.    Pt indicated most difficulty with movement out side with daily activity and in exercise.    PERTINENT HISTORY:  History of stroke in Nov 2024 with Lt side involvement.   Reported getting pretty close on  strength after stroke rehab.   PAIN:  NPRS scale: this morning 5-6/10.   Pain location: Lt shoulder anterior/superior shoulder, sometimes into upper arm.  Pain description: jabbing, sharp pain with movement Aggravating factors: lifting arm, reaching, jarring movements, sleeping.  Relieving factors: injection.   PRECAUTIONS: None  RED FLAGS: None  WEIGHT BEARING RESTRICTIONS: No  FALLS:  Has patient fallen in last 6 months? 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    PLOF: Independent, play with grandkids  Rt hand dominant  PATIENT GOALS: Reduce pain, improve arm movement.    OBJECTIVE:   PATIENT SURVEYS: Patient-Specific Activity Scoring Scheme  0 represents "unable to perform." 10 represents "able to perform at prior level. 0 1 2 3 4 5 6 7 8 9  10 (Date and Score)   Activity Eval  12/01/2023  01/26/2024  1. Lifting arm to side 4  5  2. Arm behind back  4  3  3. Lt arm overhead 4 7  4.    5.    Score 4 avg 5 avg   Total score = sum of the activity scores/number of activities Minimum detectable change (90%CI) for average score = 2 points Minimum detectable change (90%CI) for single activity score = 3 points  COGNITION: 12/01/2023 Overall cognitive status: Within functional limits for tasks assessed  SENSATION: 12/01/2023 [facility]  POSTURE:  12/01/2023 Rounded shoulders.   PALPATION: 12/01/2023 Tenderness Lt shoulder superior, anterior, lateral.    UPPER EXTREMITY  ROM:   ROM Right Eval 12/01/2023 Left Eval 12/01/2023 Left 12/10/2023 Left 01/14/2024 Left 01/26/2024  Shoulder flexion  110 120 AROM supine  134 AROM c pain supine  120 AROM in sitting  140 AROM in supine      Shoulder extension       Shoulder abduction  85  91 c pain 100 AROM in supine   Shoulder adduction       Shoulder extension       Shoulder internal rotation  60  in 45 deg abduction  70 in 45 deg abduction c pain 73 in 45 deg abduction supine  Shoulder external rotation  30 in 45 deg abduction 43 in 45 deg abduction 39  in 45 deg abduction c pain  46 after manual 48 AROM in 45 deg abduction supine   Elbow flexion       Elbow extension                                          Hand behind back T7 Lt PSIS with pain   Lt PSIS with pain   (Blank rows = not tested)  UPPER EXTREMITY MMT:  MMT Right Eval 12/01/2023 Left Eval 12/01/2023 Left 12/22/2023  Shoulder flexion 5/5 4/5 c pain 4+/5  Shoulder extension     Shoulder abduction 5/5 4/5 c pain 4/5  Shoulder adduction     Shoulder extension     Shoulder internal rotation 5/5 3+/5 4/5  Shoulder external rotation 5/5 3+/5 c pain 4/5  Middle trapezius     Lower trapezius     Elbow flexion 5/5 4/5 c pain   Elbow extension 5/5 4/5   Wrist flexion     Wrist extension     Wrist ulnar deviation     Wrist radial deviation     Wrist pronation  Wrist supination     Grip strength      (Blank rows = not tested)  SPECIAL TESTS:  12/01/2023 (+) Shrug on Lt, (-) Drop arm  FUNCTIONAL TESTS:  12/01/2023 No specific testing today                                                                                                                                                                                 TODAY'S TREATMENT:                                                                                                       DATE: 01/26/2024 Therex: UBE fwd/back 3 mins each way with 1 min rest between lvl 2.0 for  ROM Supine Lt shoulder ER AROM stretch 5 sec hold x 10 with arm in 45 deg abduction  Supine Lt shoulder IR AROM stretch 5 sec hold x 10 with arm in 45 deg abduction  Continued review of HEP use to 75-80% range if necessary to avoid exacerbation of symptoms on days where pain may be more limited. Pt education given on adapting exercise routine based off symptoms.   Self Care Discussion and education on heat/ice use for symptom relief as well as possible TENS unit options for symptom relief when symptoms are more severe at rest.   Manual Percussive device to Lt posterior shoudler. Contract/relax for rotation mobility gains Lt shoulder.  Compression to infraspinatus trigger points.   Estim: IFC 8 mins with education of self care.  Performed to Lt shoulder to tolerance level.   TODAY'S TREATMENT:                                                                                                       DATE: 01/14/2024 Therex: Supine Lt shoulder AROM ER/IR alternating 5 sec hold 1 lb x 10  each way (instruction for home use) Supine cross arm stretch 15 sec x 3 Lt Review of existing HEP for techniques and encouragement for long duration low load stretching focus with consistency.   TherActivity Pulley flexion with eccentric lowering focus with outstretched arm 2-3 sec hold 3 mins, scaption same.  Hand behind back 10 sec hold x 10 with pulleys.   Manual Supine Lt shoulder inferior glides g3 in various flexion, scaption ROM.  Posterior glide Lt GH joint with passive ER for mobilization c movement.  Percussive device to Lt posterior shoudler. Contract/relax for rotation mobility gains Lt shoulder.  Compression to infraspinatus trigger points.   TODAY'S TREATMENT:                                                                                                       DATE: 12/22/2023 Therex: Supine Lt shoulder AROM x 10 Sleeper stretch Lt 30 sec x 3 Hand behind back with strap stretch 30 sec x 3 Cross arm  stretch 30 sec x 3 ER in doorway stretch 30 sec x 3 Additional time spent in verbal review of new intervention techniques.   Manual Supine Lt shoulder inferior glides g3 in various flexion, scaption ROM.  Posterior glide Lt GH joint with passive ER for mobilization c movement.  Percussive device to Lt posterior shoudler. Contract/relax for rotation mobility gains Lt shoulder.  Compression to infraspinatus trigger points.    TODAY'S TREATMENT:                                                                                                       DATE: 12/10/2023 Therex: Side lying Lt shoulder ER AROM with towel under arm x 15 Side lying Lt shoulder abduction x 17 Side lying Lt shoulder flexion x 10  Seated scapular retraction 3 sec hold x 10  Tband rows green bilateral c scapular retraction focus x 15  TherActivity (to improve functional reaching, lifting overhead) Pulley flexion with eccentric lowering focus with outstretched arm 2-3 sec hold 3 mins, scaption same.   Education for use at home.    Manual Supine Lt shoulder inferior glides g3 in various flexion, scaption ROM.  Posterior glide Lt GH joint with passive ER for mobilization c movement.  Percussive device to Lt posterior shoudler.   PATIENT EDUCATION:  01/14/2024 Education details: HEP update Person educated: Patient Education method: Programmer, multimedia, Demonstration, Verbal cues, and Handouts Education comprehension: verbalized understanding, returned demonstration, and verbal cues required  HOME EXERCISE PROGRAM: Access Code: [email or web address] URL: [email or web address] Date: 01/14/2024 Prepared by: Bonna Bustard  Exercises - Seated Scapular Retraction  - 3-5 x daily - 7 x weekly - 1 sets -  10 reps - 3-5 hold - Supine Shoulder Flexion Extension AAROM with Dowel  - 1-2 x daily - 7 x weekly - 1-2 sets - 10-15 reps - 3 hold - Seated Shoulder Flexion Self PROM  - 1-2 x daily - 7 x weekly - 1-2 sets - 10 reps - Standing  Shoulder Posterior Capsule Stretch  - 1-2 x daily - 7 x weekly - 1 sets - 3-5 reps - 15 hold - Sleeper Stretch  - 2 x daily - 7 x weekly - 1 sets - 5 reps - 30 hold - Single Arm Doorway Pec Stretch at 90 Degrees Abduction  - 2 x daily - 7 x weekly - 1 sets - 5 reps - 30 hold - Standing Shoulder Internal Rotation Stretch with Towel (Mirrored)  - 2 x daily - 7 x weekly - 1 sets - 5 reps - 30 hold - Sidelying Shoulder Abduction Palm Forward (Mirrored)  - 1-2 x daily - 7 x weekly - 2-3 sets - 10-15 reps - Sidelying Shoulder External Rotation  - 1-2 x daily - 7 x weekly - 2-3 sets - 10-15 reps - Sidelying Shoulder Flexion 15 Degrees  - 1-2 x daily - 7 x weekly - 2-3 sets - 10-15 reps - Supine Shoulder External Rotation with Dumbbell (Mirrored)  - 1 x daily - 7 x weekly - 1 sets - 10 reps - 5-10 hold - Supine Shoulder External and Internal Rotation in Abduction with Dumbbell (Mirrored)  - 1 x daily - 7 x weekly - 1 sets - 10 reps - 5-10 hold  ASSESSMENT:  CLINICAL IMPRESSION: Updated measurements did show continued slow but steady improvements compared to previous.  Pain levels continue to be challenging for patient at rest and with movement.  Detailed and review pain management strategy with additional information about TENS unit , heat, ice , etc.  May continue to benefit from skilled PT services.    OBJECTIVE IMPAIRMENTS: decreased activity tolerance, decreased coordination, decreased endurance, decreased mobility, decreased ROM, decreased strength, hypomobility, increased fascial restrictions, impaired perceived functional ability, increased muscle spasms, impaired flexibility, impaired UE functional use, improper body mechanics, postural dysfunction, and pain.   ACTIVITY LIMITATIONS: carrying, lifting, sleeping, bed mobility, bathing, toileting, dressing, reach over head, and hygiene/grooming  PARTICIPATION LIMITATIONS: meal prep, cleaning, laundry, interpersonal relationship, driving, shopping,  community activity, and occupation  PERSONAL FACTORS: History of stroke in Nov 2024 with Lt side involvement.  are also affecting patient's functional outcome.   REHAB POTENTIAL: Good  CLINICAL DECISION MAKING: Stable/uncomplicated  EVALUATION COMPLEXITY: Low   GOALS: Goals reviewed with patient? Yes  SHORT TERM GOALS: (target date for Short term goals are 3 weeks 12/22/2023)  1.Patient will demonstrate independent use of home exercise program to maintain progress from in clinic treatments. Goal status: Met  LONG TERM GOALS: (target dates for all long term goals are 10 weeks  02/09/2024 )   1. Patient will demonstrate/report pain at worst less than or equal to 2/10 to facilitate minimal limitation in daily activity secondary to pain symptoms. Goal status: on going 01/26/2024   2. Patient will demonstrate independent use of home exercise program to facilitate ability to maintain/progress functional gains from skilled physical therapy services. Goal status: on going 01/26/2024   3. Patient will demonstrate Patient specific functional scale avg > or = 8/10 to indicate reduced disability due to condition.  Goal status: on going 01/26/2024   4.  Patient will demonstrate Lt shoulder  AROM WFL s symptoms  to facilitate usual shoulder  movements for daily activity including driving, self care, dressing, work.   Goal status: on going 01/26/2024   5.  Patient will demonstrate Lt shoulder MMT > or = 5/5 for return to work at [employer].   Goal status: on going 01/26/2024   6.  Patient will demonstrate/report ability to lift for work s restriction due to symptoms.  Goal status: on going 01/26/2024   PLAN:  PT FREQUENCY: 1-2x/week  PT DURATION: 10 weeks  Can include 40981- PT Re-evaluation, 97110-Therapeutic exercises, 97530- Therapeutic activity, 97112- Neuromuscular re-education, 97535- Self Care, [id number]- Manual therapy, [phone]- Gait training, [phone]- Orthotic Fit/training, [phone]- Canalith  repositioning, [id number]- Aquatic Therapy, [phone] Electrical stimulation (unattended), [phone]- Electrical stimulation (manual), [id number]- Vasopneumatic device, [id number]- Ultrasound, [id number] Physical performance testing, [id number]- Traction (mechanical), [id number]- Ionotophoresis 4mg /ml Dexamethasone, Patient/Family education, Balance training, Stair training, Taping, Dry Needling, Joint mobilization, Joint manipulation, Spinal manipulation, Spinal mobilization, Scar mobilization, Vestibular training, Visual/preceptual remediation/compensation, DME instructions, Cryotherapy, and Moist heat.  All performed as medically necessary.  All included unless contraindicated  PLAN FOR NEXT SESSION: Recert date upcoming.   Bonna Bustard, PT, DPT, OCS, ATC 01/26/24  9:36 AM

## 2024-02-02 ENCOUNTER — Encounter: Payer: Self-pay | Admitting: Rehabilitative and Restorative Service Providers"

## 2024-02-02 ENCOUNTER — Ambulatory Visit (INDEPENDENT_AMBULATORY_CARE_PROVIDER_SITE_OTHER): Payer: Self-pay | Admitting: Rehabilitative and Restorative Service Providers"

## 2024-02-02 DIAGNOSIS — M25612 Stiffness of left shoulder, not elsewhere classified: Secondary | ICD-10-CM

## 2024-02-02 DIAGNOSIS — M25512 Pain in left shoulder: Secondary | ICD-10-CM

## 2024-02-02 DIAGNOSIS — G8929 Other chronic pain: Secondary | ICD-10-CM

## 2024-02-02 DIAGNOSIS — R293 Abnormal posture: Secondary | ICD-10-CM

## 2024-02-02 DIAGNOSIS — M6281 Muscle weakness (generalized): Secondary | ICD-10-CM

## 2024-02-02 NOTE — Therapy (Signed)
 OUTPATIENT PHYSICAL THERAPY TREATMENT   Patient Name: Rebekah Mendez MRN: [id number] DOB:1960/09/25, 63 y.o., female Today's Date: 02/02/2024  END OF SESSION:  PT End of Session - 02/02/24 0853     Visit Number 6    Number of Visits 20    Date for PT Re-Evaluation 02/09/24    Authorization Type Self Pay    Progress Note Due on Visit 10    PT Start Time 0850    PT Stop Time 0929    PT Time Calculation (min) 39 min    Activity Tolerance Patient tolerated treatment well    Behavior During Therapy [facility] for tasks assessed/performed               Past Medical History:  Diagnosis Date   OSA (obstructive sleep apnea)    Thyroid activity decreased    late 20's   Past Surgical History:  Procedure Laterality Date   CESAREAN SECTION     Feb '89   EXCISIONAL HEMORRHOIDECTOMY     Patient Active Problem List   Diagnosis Date Noted   OSA on CPAP 11/04/2023   History of cerebrovascular accident (CVA) with residual deficit 07/06/2023   Lesion of skin of nose 07/06/2023   Coping style affecting medical condition 06/24/2023   Essential hypertension 06/17/2023   Tobacco use disorder 06/17/2023   Obesity 06/17/2023   Cerebrovascular accident (CVA) of right basal ganglia (HCC) 06/17/2023   Infarction of right basal ganglia (HCC) 06/13/2023    PCP: Vicci Barnie FURY MD  REFERRING PROVIDER: Addie Cordella Hamilton, MD  REFERRING DIAG: [phone] (ICD-10-CM) - Decreased range of motion of left shoulder  THERAPY DIAG:  Chronic left shoulder pain  Muscle weakness (generalized)  Stiffness of left shoulder, not elsewhere classified  Abnormal posture  Rationale for Evaluation and Treatment: Rehabilitation  ONSET DATE: March 2025  SUBJECTIVE:                                                                                                                                                                                                         SUBJECTIVE STATEMENT: Pt  indicated feeling complaints of soreness upon waking this morning.   PERTINENT HISTORY:  History of stroke in Nov 2024 with Lt side involvement.   Reported getting pretty close on strength after stroke rehab.   PAIN:  NPRS scale: this morning 5-6/10.   Pain location: Lt shoulder anterior/superior shoulder, sometimes into upper arm.  Pain description: jabbing, sharp pain with movement Aggravating factors: lifting arm, reaching, jarring movements, sleeping.  Relieving factors: injection.  PRECAUTIONS: None  RED FLAGS: None  WEIGHT BEARING RESTRICTIONS: No  FALLS:  Has patient fallen in last 6 months? 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    PLOF: Independent, play with grandkids  Rt hand dominant  PATIENT GOALS: Reduce pain, improve arm movement.    OBJECTIVE:   PATIENT SURVEYS: Patient-Specific Activity Scoring Scheme  0 represents "unable to perform." 10 represents "able to perform at prior level. 0 1 2 3 4 5 6 7 8 9  10 (Date and Score)   Activity Eval  12/01/2023  01/26/2024  1. Lifting arm to side 4  5  2. Arm behind back  4  3  3. Lt arm overhead 4 7  4.    5.    Score 4 avg 5 avg   Total score = sum of the activity scores/number of activities Minimum detectable change (90%CI) for average score = 2 points Minimum detectable change (90%CI) for single activity score = 3 points  COGNITION: 12/01/2023 Overall cognitive status: Within functional limits for tasks assessed  SENSATION: 12/01/2023 [facility]  POSTURE:  12/01/2023 Rounded shoulders.   PALPATION: 12/01/2023 Tenderness Lt shoulder superior, anterior, lateral.    UPPER EXTREMITY ROM:   ROM Right Eval 12/01/2023 Left Eval 12/01/2023 Left 12/10/2023 Left 01/14/2024 Left 01/26/2024 Left 02/02/2024  Shoulder flexion  110 120 AROM supine  134 AROM c pain supine  120 AROM in sitting  140 AROM in supine        Shoulder extension        Shoulder abduction  85  91 c pain 100 AROM in supine    Shoulder adduction        Shoulder extension        Shoulder internal rotation  60  in 45 deg abduction  70 in 45 deg abduction c pain 73 in 45 deg abduction supine   Shoulder external rotation  30 in 45 deg abduction 43 in 45 deg abduction 39  in 45 deg abduction c pain  46 after manual 48 AROM in 45 deg abduction supine  60 AROM in 70 deg abduction supine  Elbow flexion        Elbow extension                                                Hand behind back T7 Lt PSIS with pain   Lt PSIS with pain    (Blank rows = not tested)  UPPER EXTREMITY MMT:  MMT Right Eval 12/01/2023 Left Eval 12/01/2023 Left 12/22/2023 Left 02/02/2024  Shoulder flexion 5/5 4/5 c pain 4+/5 4+/5  Shoulder extension      Shoulder abduction 5/5 4/5 c pain 4/5 4/5  Shoulder adduction      Shoulder extension      Shoulder internal rotation 5/5 3+/5 4/5 5/5  Shoulder external rotation 5/5 3+/5 c pain 4/5 4/5  Middle trapezius      Lower trapezius      Elbow flexion 5/5 4/5 c pain    Elbow extension 5/5 4/5    Wrist flexion      Wrist extension      Wrist ulnar deviation      Wrist radial deviation      Wrist pronation      Wrist supination  Grip strength       (Blank rows = not tested)  SPECIAL TESTS:  12/01/2023 (+) Shrug on Lt, (-) Drop arm  FUNCTIONAL TESTS:  12/01/2023 No specific testing today                                                                                                                                                                                 TODAY'S TREATMENT:                                                                                                       DATE: 02/02/2024 Therex: Cross arm stretch 15 sec x 5 Lt in sitting.  Sleeper stretch instruction and performance 30 sec x 2 with cues for contract /relax techniques. Doorway ER stretch to tolerance 15 sec x 5    TherActivity (to improve reaching overhead, rotation for behind head/back) Pulley flexion 3 mins 5  sec hold , scaption 3 mins 5 sec hold Lt shoulder with eccentric lowering focus Pulley ER 10 sec stretch with arm in approx. 60 deg abduction supported by table 3 mins   Manual Lt shoulder inferior joint mobs GH joint g3 in flexion, scaption and abduction in supine.  Posterior glide Lt GH joint with passive ER mobilization c movement.  Contract/relax techniques for ER/IR in supine Lt shoulder.  G2 ap /pa AC joint glides.    TODAY'S TREATMENT:                                                                                                       DATE: 01/26/2024 Therex: UBE fwd/back 3 mins each way with 1 min rest between lvl 2.0 for ROM Supine Lt shoulder ER AROM stretch 5 sec hold x 10 with arm in 45 deg abduction  Supine Lt shoulder IR AROM stretch 5 sec hold  x 10 with arm in 45 deg abduction  Continued review of HEP use to 75-80% range if necessary to avoid exacerbation of symptoms on days where pain may be more limited. Pt education given on adapting exercise routine based off symptoms.   Self Care Discussion and education on heat/ice use for symptom relief as well as possible TENS unit options for symptom relief when symptoms are more severe at rest.   Manual Percussive device to Lt posterior shoudler. Contract/relax for rotation mobility gains Lt shoulder.  Compression to infraspinatus trigger points.   Estim: IFC 8 mins with education of self care.  Performed to Lt shoulder to tolerance level.   TODAY'S TREATMENT:                                                                                                       DATE: 01/14/2024 Therex: Supine Lt shoulder AROM ER/IR alternating 5 sec hold 1 lb x 10 each way (instruction for home use) Supine cross arm stretch 15 sec x 3 Lt Review of existing HEP for techniques and encouragement for long duration low load stretching focus with  consistency.   TherActivity Pulley flexion with eccentric lowering focus with outstretched arm 2-3 sec hold 3 mins, scaption same.  Hand behind back 10 sec hold x 10 with pulleys.   Manual Supine Lt shoulder inferior glides g3 in various flexion, scaption ROM.  Posterior glide Lt GH joint with passive ER for mobilization c movement.  Percussive device to Lt posterior shoudler. Contract/relax for rotation mobility gains Lt shoulder.  Compression to infraspinatus trigger points.   TODAY'S TREATMENT:                                                                                                       DATE: 12/22/2023 Therex: Supine Lt shoulder AROM x 10 Sleeper stretch Lt 30 sec x 3 Hand behind back with strap stretch 30 sec x 3 Cross arm stretch 30 sec x 3 ER in doorway stretch 30 sec x 3 Additional time spent in verbal review of new intervention techniques.   Manual Supine Lt shoulder inferior glides g3 in various flexion, scaption ROM.  Posterior glide Lt GH joint with passive ER for mobilization c movement.  Percussive device to Lt posterior shoudler. Contract/relax for rotation mobility gains Lt shoulder.  Compression to infraspinatus trigger points.    TODAY'S TREATMENT:  DATE: 12/10/2023 Therex: Side lying Lt shoulder ER AROM with towel under arm x 15 Side lying Lt shoulder abduction x 17 Side lying Lt shoulder flexion x 10  Seated scapular retraction 3 sec hold x 10  Tband rows green bilateral c scapular retraction focus x 15  TherActivity (to improve functional reaching, lifting overhead) Pulley flexion with eccentric lowering focus with outstretched arm 2-3 sec hold 3 mins, scaption same.   Education for use at home.    Manual Supine Lt shoulder inferior glides g3 in various flexion, scaption ROM.  Posterior glide Lt GH joint with passive ER for mobilization c movement.   Percussive device to Lt posterior shoudler.   PATIENT EDUCATION:  01/14/2024 Education details: HEP update Person educated: Patient Education method: Programmer, multimedia, Demonstration, Verbal cues, and Handouts Education comprehension: verbalized understanding, returned demonstration, and verbal cues required  HOME EXERCISE PROGRAM: Access Code: [email or web address] URL: [email or web address] Date: 01/14/2024 Prepared by: Ozell Silvan  Exercises - Seated Scapular Retraction  - 3-5 x daily - 7 x weekly - 1 sets - 10 reps - 3-5 hold - Supine Shoulder Flexion Extension AAROM with Dowel  - 1-2 x daily - 7 x weekly - 1-2 sets - 10-15 reps - 3 hold - Seated Shoulder Flexion Self PROM  - 1-2 x daily - 7 x weekly - 1-2 sets - 10 reps - Standing Shoulder Posterior Capsule Stretch  - 1-2 x daily - 7 x weekly - 1 sets - 3-5 reps - 15 hold - Sleeper Stretch  - 2 x daily - 7 x weekly - 1 sets - 5 reps - 30 hold - Single Arm Doorway Pec Stretch at 90 Degrees Abduction  - 2 x daily - 7 x weekly - 1 sets - 5 reps - 30 hold - Standing Shoulder Internal Rotation Stretch with Towel (Mirrored)  - 2 x daily - 7 x weekly - 1 sets - 5 reps - 30 hold - Sidelying Shoulder Abduction Palm Forward (Mirrored)  - 1-2 x daily - 7 x weekly - 2-3 sets - 10-15 reps - Sidelying Shoulder External Rotation  - 1-2 x daily - 7 x weekly - 2-3 sets - 10-15 reps - Sidelying Shoulder Flexion 15 Degrees  - 1-2 x daily - 7 x weekly - 2-3 sets - 10-15 reps - Supine Shoulder External Rotation with Dumbbell (Mirrored)  - 1 x daily - 7 x weekly - 1 sets - 10 reps - 5-10 hold - Supine Shoulder External and Internal Rotation in Abduction with Dumbbell (Mirrored)  - 1 x daily - 7 x weekly - 1 sets - 10 reps - 5-10 hold  ASSESSMENT:  CLINICAL IMPRESSION: Pt continued to show steady improvement in range of motion measured in various directions compared to last few visits.  Capsular tightness did seem to be showing some reduction with  guarding/pain noted as more of a factor in motion. Continued encouragement given with focus on improvements noted and  encouraged continued    OBJECTIVE IMPAIRMENTS: decreased activity tolerance, decreased coordination, decreased endurance, decreased mobility, decreased ROM, decreased strength, hypomobility, increased fascial restrictions, impaired perceived functional ability, increased muscle spasms, impaired flexibility, impaired UE functional use, improper body mechanics, postural dysfunction, and pain.   ACTIVITY LIMITATIONS: carrying, lifting, sleeping, bed mobility, bathing, toileting, dressing, reach over head, and hygiene/grooming  PARTICIPATION LIMITATIONS: meal prep, cleaning, laundry, interpersonal relationship, driving, shopping, community activity, and occupation  PERSONAL FACTORS: History of stroke in Nov 2024 with Lt side involvement.  are also affecting  patient's functional outcome.   REHAB POTENTIAL: Good  CLINICAL DECISION MAKING: Stable/uncomplicated  EVALUATION COMPLEXITY: Low   GOALS: Goals reviewed with patient? Yes  SHORT TERM GOALS: (target date for Short term goals are 3 weeks 12/22/2023)  1.Patient will demonstrate independent use of home exercise program to maintain progress from in clinic treatments. Goal status: Met  LONG TERM GOALS: (target dates for all long term goals are 10 weeks  02/09/2024 )   1. Patient will demonstrate/report pain at worst less than or equal to 2/10 to facilitate minimal limitation in daily activity secondary to pain symptoms. Goal status: on going 01/26/2024   2. Patient will demonstrate independent use of home exercise program to facilitate ability to maintain/progress functional gains from skilled physical therapy services. Goal status: on going 01/26/2024   3. Patient will demonstrate Patient specific functional scale avg > or = 8/10 to indicate reduced disability due to condition.  Goal status: on going 01/26/2024   4.   Patient will demonstrate Lt shoulder  AROM WFL s symptoms to facilitate usual shoulder  movements for daily activity including driving, self care, dressing, work.   Goal status: on going 01/26/2024   5.  Patient will demonstrate Lt shoulder MMT > or = 5/5 for return to work at [employer].   Goal status: on going 01/26/2024   6.  Patient will demonstrate/report ability to lift for work s restriction due to symptoms.  Goal status: on going 01/26/2024   PLAN:  PT FREQUENCY: 1-2x/week  PT DURATION: 10 weeks  Can include 02853- PT Re-evaluation, 97110-Therapeutic exercises, 97530- Therapeutic activity, 97112- Neuromuscular re-education, 97535- Self Care, [id number]- Manual therapy, [phone]- Gait training, [phone]- Orthotic Fit/training, [phone]- Canalith repositioning, [id number]- Aquatic Therapy, [phone] Electrical stimulation (unattended), [phone]- Electrical stimulation (manual), [id number]- Vasopneumatic device, [id number]- Ultrasound, [id number] Physical performance testing, [id number]- Traction (mechanical), [id number]- Ionotophoresis 4mg /ml Dexamethasone, Patient/Family education, Balance training, Stair training, Taping, Dry Needling, Joint mobilization, Joint manipulation, Spinal manipulation, Spinal mobilization, Scar mobilization, Vestibular training, Visual/preceptual remediation/compensation, DME instructions, Cryotherapy, and Moist heat.  All performed as medically necessary.  All included unless contraindicated  PLAN FOR NEXT SESSION: Continued mobility improvements.  Recert date upcoming.   Ozell Silvan, PT, DPT, OCS, ATC 02/02/24  9:39 AM

## 2024-02-11 ENCOUNTER — Encounter: Payer: Self-pay | Admitting: Rehabilitative and Restorative Service Providers"

## 2024-02-11 ENCOUNTER — Ambulatory Visit (INDEPENDENT_AMBULATORY_CARE_PROVIDER_SITE_OTHER): Payer: Self-pay | Admitting: Rehabilitative and Restorative Service Providers"

## 2024-02-11 DIAGNOSIS — M25612 Stiffness of left shoulder, not elsewhere classified: Secondary | ICD-10-CM

## 2024-02-11 DIAGNOSIS — G8929 Other chronic pain: Secondary | ICD-10-CM

## 2024-02-11 DIAGNOSIS — M25512 Pain in left shoulder: Secondary | ICD-10-CM

## 2024-02-11 DIAGNOSIS — M6281 Muscle weakness (generalized): Secondary | ICD-10-CM

## 2024-02-11 DIAGNOSIS — R293 Abnormal posture: Secondary | ICD-10-CM

## 2024-02-11 NOTE — Therapy (Addendum)
 OUTPATIENT PHYSICAL THERAPY TREATMENT / RECERT / DISCHARGE   Patient Name: Rebekah Mendez MRN: [id number] DOB:05-14-1961, 63 y.o., female Today's Date: 02/11/2024  Progress Note Reporting Period 12/01/2023 to 02/11/2024  See note below for Objective Data and Assessment of Progress/Goals.       END OF SESSION:  PT End of Session - 02/11/24 1147     Visit Number 7    Number of Visits 20    Date for PT Re-Evaluation 02/09/24    Authorization Type Self Pay    Progress Note Due on Visit 10    PT Start Time 1145    PT Stop Time 1224    PT Time Calculation (min) 39 min    Activity Tolerance Patient tolerated treatment well    Behavior During Therapy [facility] for tasks assessed/performed                Past Medical History:  Diagnosis Date   OSA (obstructive sleep apnea)    Thyroid activity decreased    late 20's   Past Surgical History:  Procedure Laterality Date   CESAREAN SECTION     Feb '89   EXCISIONAL HEMORRHOIDECTOMY     Patient Active Problem List   Diagnosis Date Noted   OSA on CPAP 11/04/2023   History of cerebrovascular accident (CVA) with residual deficit 07/06/2023   Lesion of skin of nose 07/06/2023   Coping style affecting medical condition 06/24/2023   Essential hypertension 06/17/2023   Tobacco use disorder 06/17/2023   Obesity 06/17/2023   Cerebrovascular accident (CVA) of right basal ganglia (HCC) 06/17/2023   Infarction of right basal ganglia (HCC) 06/13/2023    PCP: Vicci Barnie FURY MD  REFERRING PROVIDER: Addie Cordella Hamilton, MD  REFERRING DIAG: [phone] (ICD-10-CM) - Decreased range of motion of left shoulder  THERAPY DIAG:  Chronic left shoulder pain  Muscle weakness (generalized)  Stiffness of left shoulder, not elsewhere classified  Abnormal posture  Rationale for Evaluation and Treatment: Rehabilitation  ONSET DATE: March 2025  SUBJECTIVE:                                                                                                                                                                                                          SUBJECTIVE STATEMENT: Pt indicated insidious more noted shooting every now and then  at rest noted in last few days.   PERTINENT HISTORY:  History of stroke in Nov 2024 with Lt side involvement.   Reported getting pretty close on strength after stroke rehab.   PAIN:  NPRS scale:  this morning 5-6/10, upon arrival 2-3/10 Pain location: Lt shoulder anterior/superior shoulder, sometimes into upper arm.  Pain description: jabbing, sharp pain with movement Aggravating factors: lifting arm, reaching, jarring movements, sleeping.  Relieving factors: injection.   PRECAUTIONS: None  RED FLAGS: None  WEIGHT BEARING RESTRICTIONS: No  FALLS:  Has patient fallen in last 6 months? 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    PLOF: Independent, play with grandkids  Rt hand dominant  PATIENT GOALS: Reduce pain, improve arm movement.    OBJECTIVE:   PATIENT SURVEYS: Patient-Specific Activity Scoring Scheme  0 represents "unable to perform." 10 represents "able to perform at prior level. 0 1 2 3 4 5 6 7 8 9  10 (Date and Score)   Activity Eval  12/01/2023  01/26/2024 02/11/2024  1. Lifting arm to side 4  5 6   2. Arm behind back  4  3 3   3. Lt arm overhead 4 7 7   4.     5.     Score 4 avg 5 avg 5.33   Total score = sum of the activity scores/number of activities Minimum detectable change (90%CI) for average score = 2 points Minimum detectable change (90%CI) for single activity score = 3 points  COGNITION: 12/01/2023 Overall cognitive status: Within functional limits for tasks assessed  SENSATION: 12/01/2023 [facility]  POSTURE:  12/01/2023 Rounded shoulders.   PALPATION: 12/01/2023 Tenderness Lt shoulder superior, anterior, lateral.    UPPER EXTREMITY ROM:   ROM  Right Eval 12/01/2023 Left Eval 12/01/2023 Left 12/10/2023 Left 01/14/2024 Left 01/26/2024 Left 02/02/2024  Shoulder flexion  110 120 AROM supine  134 AROM c pain supine  120 AROM in sitting  140 AROM in supine       Shoulder extension        Shoulder abduction  85  91 c pain 100 AROM in supine    Shoulder adduction        Shoulder extension        Shoulder internal rotation  60  in 45 deg abduction  70 in 45 deg abduction c pain 73 in 45 deg abduction supine   Shoulder external rotation  30 in 45 deg abduction 43 in 45 deg abduction 39  in 45 deg abduction c pain  46 after manual 48 AROM in 45 deg abduction supine  60 AROM in 70 deg abduction supine  Elbow flexion        Elbow extension                                                Hand behind back T7 Lt PSIS with pain   Lt PSIS with pain    (Blank rows = not tested)  UPPER EXTREMITY MMT:  MMT Right Eval 12/01/2023 Left Eval 12/01/2023 Left 12/22/2023 Left 02/02/2024  Shoulder flexion 5/5 4/5 c pain 4+/5 4+/5  Shoulder extension      Shoulder abduction 5/5 4/5 c pain 4/5 4/5  Shoulder adduction      Shoulder extension      Shoulder internal rotation 5/5 3+/5 4/5 5/5  Shoulder external rotation 5/5 3+/5 c pain 4/5 4/5  Middle trapezius      Lower trapezius      Elbow flexion 5/5 4/5 c pain    Elbow extension  5/5 4/5    Wrist flexion      Wrist extension      Wrist ulnar deviation      Wrist radial deviation      Wrist pronation      Wrist supination      Grip strength       (Blank rows = not tested)  SPECIAL TESTS:  12/01/2023 (+) Shrug on Lt, (-) Drop arm  FUNCTIONAL TESTS:  12/01/2023 No specific testing today                                                                                                                                                                                 TODAY'S TREATMENT:                                                                                                        DATE:  02/11/2024 Therex: HEP review with verbal cues in activity and advice on continued use/frequency of activity  Neuro Re-ed Standing rows blue band 2 x 15 c scapular retraction focus.  Standing blue band GH ext 2 x 15 to available range.   Manual Lt shoulder inferior joint mobs GH joint g3 in flexion, scaption and abduction in supine.  Posterior glide Lt GH joint with passive ER mobilization c movement.  Contract/relax techniques for ER/IR in supine Lt shoulder.  G2 ap /pa AC joint glides.   TODAY'S TREATMENT:                                                                                                       DATE: 02/02/2024 Therex: Cross arm stretch 15 sec x 5 Lt in sitting.  Sleeper stretch instruction and performance 30 sec x 2 with cues for contract /relax techniques. Doorway ER stretch to tolerance 15 sec x 5  TherActivity (to improve reaching overhead, rotation for behind head/back) Pulley flexion 3 mins 5  sec hold , scaption 3 mins 5 sec hold Lt shoulder with eccentric lowering focus Pulley ER 10 sec stretch with arm in approx. 60 deg abduction supported by table 3 mins   Manual Lt shoulder inferior joint mobs GH joint g3 in flexion, scaption and abduction in supine.  Posterior glide Lt GH joint with passive ER mobilization c movement.  Contract/relax techniques for ER/IR in supine Lt shoulder.  G2 ap /pa AC joint glides.    TODAY'S TREATMENT:                                                                                                       DATE: 01/26/2024 Therex: UBE fwd/back 3 mins each way with 1 min rest between lvl 2.0 for ROM Supine Lt shoulder ER AROM stretch 5 sec hold x 10 with arm in 45 deg abduction  Supine Lt shoulder IR AROM stretch 5 sec hold x 10 with arm in 45 deg abduction  Continued review of HEP use to 75-80% range if necessary to avoid exacerbation of symptoms on days where pain may be more limited. Pt education given on adapting exercise routine  based off symptoms.   Self Care Discussion and education on heat/ice use for symptom relief as well as possible TENS unit options for symptom relief when symptoms are more severe at rest.   Manual Percussive device to Lt posterior shoudler. Contract/relax for rotation mobility gains Lt shoulder.  Compression to infraspinatus trigger points.   Estim: IFC 8 mins with education of self care.  Performed to Lt shoulder to tolerance level.   TODAY'S TREATMENT:                                                                                                       DATE: 01/14/2024 Therex: Supine Lt shoulder AROM ER/IR alternating 5 sec hold 1 lb x 10 each way (instruction for home use) Supine cross arm stretch 15 sec x 3 Lt Review of existing HEP for techniques and encouragement for long duration low load stretching focus with consistency.   TherActivity Pulley flexion with eccentric lowering focus with outstretched arm 2-3 sec hold 3 mins, scaption same.  Hand behind back 10 sec hold x 10 with pulleys.   Manual Supine Lt shoulder inferior glides g3 in various flexion, scaption ROM.  Posterior glide Lt GH joint with passive ER for mobilization c movement.  Percussive device to Lt posterior shoudler. Contract/relax for rotation mobility gains Lt shoulder.  Compression to infraspinatus trigger points.   PATIENT EDUCATION:  01/14/2024 Education details: HEP update  Person educated: Patient Education method: Explanation, Demonstration, Verbal cues, and Handouts Education comprehension: verbalized understanding, returned demonstration, and verbal cues required  HOME EXERCISE PROGRAM: Access Code: [email or web address] URL: [email or web address] Date: 02/11/2024 Prepared by: Ozell Silvan  Exercises - Seated Scapular Retraction  - 3-5 x daily - 7 x weekly - 1 sets - 10 reps - 3-5 hold - Supine Shoulder Flexion Extension AAROM with Dowel  - 1-2 x daily - 7 x weekly - 1-2 sets - 10-15 reps - 3  hold - Seated Shoulder Flexion Self PROM  - 1-2 x daily - 7 x weekly - 1-2 sets - 10 reps - Standing Shoulder Posterior Capsule Stretch  - 1-2 x daily - 7 x weekly - 1 sets - 3-5 reps - 15 hold - Sleeper Stretch  - 2 x daily - 7 x weekly - 1 sets - 5 reps - 30 hold - Single Arm Doorway Pec Stretch at 90 Degrees Abduction  - 2 x daily - 7 x weekly - 1 sets - 5 reps - 30 hold - Standing Shoulder Internal Rotation Stretch with Towel (Mirrored)  - 2 x daily - 7 x weekly - 1 sets - 5 reps - 30 hold - Sidelying Shoulder Abduction Palm Forward (Mirrored)  - 1-2 x daily - 7 x weekly - 2-3 sets - 10-15 reps - Sidelying Shoulder External Rotation  - 1-2 x daily - 7 x weekly - 2-3 sets - 10-15 reps - Sidelying Shoulder Flexion 15 Degrees  - 1-2 x daily - 7 x weekly - 2-3 sets - 10-15 reps - Supine Shoulder External Rotation with Dumbbell (Mirrored)  - 1 x daily - 7 x weekly - 1 sets - 10 reps - 5-10 hold - Supine Shoulder External and Internal Rotation in Abduction with Dumbbell (Mirrored)  - 1 x daily - 7 x weekly - 1 sets - 10 reps - 5-10 hold  ASSESSMENT:  CLINICAL IMPRESSION: Quality of Lt shoulder gh joint mobility continued to show improvement with most reduced tightness noted today compared to previous visits. Pain in end range continued to restriction Lt shoulder movement from PLOF.  At this time, Pt desired to trial HEP for continued treatment.  Clinician was in agreement, reviewed HEP and detailed etiology of frozen shoulder timeline to encourage continued commitment to activity.     OBJECTIVE IMPAIRMENTS: decreased activity tolerance, decreased coordination, decreased endurance, decreased mobility, decreased ROM, decreased strength, hypomobility, increased fascial restrictions, impaired perceived functional ability, increased muscle spasms, impaired flexibility, impaired UE functional use, improper body mechanics, postural dysfunction, and pain.   ACTIVITY LIMITATIONS: carrying, lifting,  sleeping, bed mobility, bathing, toileting, dressing, reach over head, and hygiene/grooming  PARTICIPATION LIMITATIONS: meal prep, cleaning, laundry, interpersonal relationship, driving, shopping, community activity, and occupation  PERSONAL FACTORS: History of stroke in Nov 2024 with Lt side involvement.  are also affecting patient's functional outcome.   REHAB POTENTIAL: Good  CLINICAL DECISION MAKING: Stable/uncomplicated  EVALUATION COMPLEXITY: Low   GOALS: Goals reviewed with patient? Yes  SHORT TERM GOALS: (target date for Short term goals are 3 weeks 12/22/2023)  1.Patient will demonstrate independent use of home exercise program to maintain progress from in clinic treatments. Goal status: Met  LONG TERM GOALS: (target dates for all long term goals are 4 weeks  03/10/2024   )   1. Patient will demonstrate/report pain at worst less than or equal to 2/10 to facilitate minimal limitation in daily activity secondary to pain symptoms. Goal status: on going 02/11/2024  2. Patient will demonstrate independent use of home exercise program to facilitate ability to maintain/progress functional gains from skilled physical therapy services. Goal status: on going 02/11/2024   3. Patient will demonstrate Patient specific functional scale avg > or = 8/10 to indicate reduced disability due to condition.  Goal status: on going 02/11/2024   4.  Patient will demonstrate Lt shoulder  AROM WFL s symptoms to facilitate usual shoulder  movements for daily activity including driving, self care, dressing, work.   Goal status: on going 02/11/2024   5.  Patient will demonstrate Lt shoulder MMT > or = 5/5 for return to work at [employer].   Goal status: on going 02/11/2024   6.  Patient will demonstrate/report ability to lift for work s restriction due to symptoms.  Goal status: on going 02/11/2024   PLAN:  PT FREQUENCY: 1-2x/week  PT DURATION: 10 weeks  Can include 02853- PT Re-evaluation,  97110-Therapeutic exercises, 97530- Therapeutic activity, 97112- Neuromuscular re-education, 97535- Self Care, [id number]- Manual therapy, [phone]- Gait training, [phone]- Orthotic Fit/training, [phone]- Canalith repositioning, [id number]- Aquatic Therapy, [phone] Electrical stimulation (unattended), [phone]- Electrical stimulation (manual), [id number]- Vasopneumatic device, [id number]- Ultrasound, [id number] Physical performance testing, [id number]- Traction (mechanical), [id number]- Ionotophoresis 4mg /ml Dexamethasone, Patient/Family education, Balance training, Stair training, Taping, Dry Needling, Joint mobilization, Joint manipulation, Spinal manipulation, Spinal mobilization, Scar mobilization, Vestibular training, Visual/preceptual remediation/compensation, DME instructions, Cryotherapy, and Moist heat.  All performed as medically necessary.  All included unless contraindicated  PLAN FOR NEXT SESSION: Continued reduced frequency with HEP trial, can return when needed.   Ozell Silvan, PT, DPT, OCS, ATC 02/11/24  12:04 PM   PHYSICAL THERAPY DISCHARGE SUMMARY  Visits from Start of Care: 7  Current functional level related to goals / functional outcomes: See note   Remaining deficits: See note   Education / Equipment: HEP  Patient goals were partially met. Patient is being discharged due to not returning since the last visit.  Ozell Silvan, PT, DPT, OCS, ATC 04/01/24  2:00 PM

## 2024-04-16 ENCOUNTER — Other Ambulatory Visit: Payer: Self-pay | Admitting: Internal Medicine

## 2024-04-16 DIAGNOSIS — I10 Essential (primary) hypertension: Secondary | ICD-10-CM

## 2024-04-16 NOTE — Telephone Encounter (Signed)
 Requested Prescriptions  Pending Prescriptions Disp Refills   amLODipine  (NORVASC ) 10 MG tablet [Pharmacy Med Name: AMLODIPINE  BESYLATE 10 MG TAB] 90 tablet 0    Sig: TAKE 1 TABLET BY MOUTH EVERY DAY     Cardiovascular: Calcium  Channel Blockers 2 Passed - 04/16/2024  9:49 AM      Passed - Last BP in normal range    BP Readings from Last 1 Encounters:  01/08/24 114/76         Passed - Last Heart Rate in normal range    Pulse Readings from Last 1 Encounters:  01/08/24 66         Passed - Valid encounter within last 6 months    Recent Outpatient Visits           3 months ago Pap smear for cervical cancer screening   [facility] Dept Of [place]. [facility] Rebekah Barnie NOVAK, MD   5 months ago Essential hypertension   [facility] - A Dept Of [place]. [facility] Rebekah Barnie NOVAK, MD   9 months ago Hospital discharge follow-up   [facility] - A Dept Of [place]. [facility] Rebekah Barnie NOVAK, MD

## 2024-05-05 ENCOUNTER — Encounter: Payer: Self-pay | Admitting: Adult Health

## 2024-05-05 ENCOUNTER — Ambulatory Visit (INDEPENDENT_AMBULATORY_CARE_PROVIDER_SITE_OTHER): Payer: Self-pay | Admitting: Adult Health

## 2024-05-05 VITALS — BP 130/74 | HR 67 | Ht 64.0 in | Wt 221.0 lb

## 2024-05-05 DIAGNOSIS — I6381 Other cerebral infarction due to occlusion or stenosis of small artery: Secondary | ICD-10-CM

## 2024-05-05 DIAGNOSIS — G4733 Obstructive sleep apnea (adult) (pediatric): Secondary | ICD-10-CM

## 2024-05-05 NOTE — Progress Notes (Signed)
 PATIENT: Rebekah Mendez DOB: 1961/01/09  REASON FOR VISIT: follow up HISTORY FROM: patient PRIMARY NEUROLOGIST: Dr. Rosemarie  Chief Complaint  Patient presents with   Follow-up    Pt in 18 alone Pt here for stroke f/u Pt states headaches     HISTORY OF PRESENT ILLNESS: Today 05/05/24:  Rebekah Mendez is a 63 y.o. female with a history of right basal ganglia stroke. Returns today for follow-up.  Patient reports that she has not had any additional strokelike symptoms.  Overall she has been doing fairly well.  She remains on aspirin .  Denies any significant bruising or bleeding.  Remains on Crestor  for her cholesterol.  Hemoglobin A1c was in normal range.  She has been following up regularly with her PCP to manage risk factors.  In the hospital she did the sleep study trial and was given a CPAP machine.  She states that the trial has not ended but she is still using the CPAP.  She is curious in who is going to manage this going forward.   08/28/23: [name] is a 63 y.o. female with history of right basal ganglia stroke.  Here for hospital follow-up.  She states overall her symptoms have pretty much resolved.  She does note some weakness in the left hand.  She did do PT and OT after hospitalization.  She remains on aspirin  daily.  She is on Crestor  for her cholesterol.  Hemoglobin A1c has been in normal range.  She is checking her blood pressure regularly.  She is on Norvasc  and lisinopril .  Fortunately she has not had any additional strokelike symptoms.  She is traveling to [place] in February questioning if this is okay to do that after stroke.  Patient denies any changes in her mood or behavior.  Patient did do sleep study trial in the hospital.  It was determined that she does have sleep apnea and she was given a CPAP machine.  She reports that she has been using it with good benefit.  HISTORY [name] is a  63 year old woman with no significant past  medical history because she has not seen a physician presented to the emergency department for evaluation of sudden onset of left-sided face arm and leg weakness along with decreased sensation on the left and some slurred speech.   She was having dinner around 6:30 PM when she had sudden onset of left-sided numbness and weakness. CODE STROKE activated at [facility]. BP 225 systolic on arrival. Patient was reportedly also very anxious on arrival. NIH: 4. After BP controlled, TNK was given at 2007.   MRI shows R basal ganglia infarct.     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 ASPECTS 10.    CTA head & neck  No LVO or significant stenosis Beading of bilateral mid ICAs, often seen in fibromuscular dysplasia   MRI   1.6cm acute ischemic non-hemorrhagic posterior right basal ganglia infarct Small remote hemorrhagic lacunar infarct right lentiform nucleus   2D Echo: EF 70-75%. LV hyperdynamic and no wall motion abnormalities.  Carotid US : Non significant plaque noted in mid to distal R ICA and L ICA.  No evidence of stenosis.   TCD w/Bubble Study: Negative  US  LE DVT: No evidence of DVT  ANA labs: outptaient follow up LDL  111 HgbA1c 5.6   VTE prophylaxis - SCDs and Lovenox   No hemorrhage on CT  Continue ASA and plavix  for 3 weeks, followed by aspirin  monotherapy.  Therapy recommendations:  CIR, awaiting full assessment  OOB as tolerable and ambulation  Disposition:  CIR    REVIEW OF SYSTEMS: Out of a complete 14 system review of symptoms, the patient complains only of the following symptoms, and all other reviewed systems are negative.  ALLERGIES: No Known Allergies  HOME MEDICATIONS: Outpatient Medications Prior to Visit  Medication Sig Dispense Refill   amLODipine  (NORVASC ) 10 MG tablet  TAKE 1 TABLET BY MOUTH EVERY DAY 90 tablet 0   aspirin  EC 81 MG tablet Take 1 tablet (81 mg total) by mouth daily. Swallow whole. 100 tablet 0   lisinopril  (ZESTRIL ) 2.5 MG tablet Take 1 tablet (2.5 mg total) by mouth daily. 90 tablet 1   melatonin 3 MG TABS tablet Take 1 tablet (3 mg total) by mouth at bedtime. 30 tablet 0   rosuvastatin  (CRESTOR ) 20 MG tablet Take 1 tablet (20 mg total) by mouth daily. 90 tablet 1   senna-docusate (SENOKOT-S) 8.6-50 MG tablet Take 1 tablet by mouth at bedtime. (Patient not taking: Reported on 01/08/2024)     No facility-administered medications prior to visit.    PAST MEDICAL HISTORY: Past Medical History:  Diagnosis Date   OSA (obstructive sleep apnea)    Thyroid activity decreased    late 20's    PAST SURGICAL HISTORY: Past Surgical History:  Procedure Laterality Date   CESAREAN SECTION     Feb '89   EXCISIONAL HEMORRHOIDECTOMY      FAMILY HISTORY: Family History  Problem Relation Age of Onset   Stroke Paternal Uncle    Colon cancer Neg Hx     SOCIAL HISTORY: Social History   Socioeconomic History   Marital status: Married    Spouse name: Not on file   Number of children: Not on file   Years of education: Not on file   Highest education level: Bachelor's degree (e.g., BA, AB, BS)  Occupational History   Not on file  Tobacco Use   Smoking status: Former    Current packs/day: 0.00    Types: Cigarettes    Quit date: 04/2023    Years since quitting: 1.0   Smokeless tobacco: Never  Vaping Use   Vaping status: Never Used  Substance and Sexual Activity   Alcohol use: Yes    Comment: occ   Drug use: No   Sexual activity: Not on file  Other Topics Concern   Not on file  Social History Narrative   Pt lives with family    Pt works    Social Drivers of Corporate investment banker Strain: Low Risk  (01/07/2024)   Overall Financial Resource Strain (CARDIA)    Difficulty of Paying Living Expenses: Not hard at all  Food  Insecurity: No Food Insecurity (01/07/2024)   Hunger Vital Sign    Worried About Running Out of Food in the Last Year: Never true    Ran Out of Food in the Last Year: Never true  Transportation Needs: No Transportation Needs (01/07/2024)   PRAPARE - Administrator, Civil Service (Medical): No    Lack of Transportation (Non-Medical): No  Physical Activity: Inactive (01/07/2024)   Exercise Vital Sign    Days of Exercise per Week: 0 days    Minutes of Exercise per Session: 0 min  Stress: No Stress Concern  Present (01/07/2024)   [employer] of Occupational Health - Occupational Stress Questionnaire    Feeling of Stress : Not at all  Social Connections: Socially Integrated (01/07/2024)   Social Connection and Isolation Panel    Frequency of Communication with Friends and Family: More than three times a week    Frequency of Social Gatherings with Friends and Family: Once a week    Attends Religious Services: More than 4 times per year    Active Member of [employer] or Organizations: Yes    Attends Engineer, structural: More than 4 times per year    Marital Status: Married  Catering manager Violence: Not At Risk (11/04/2023)   Humiliation, Afraid, Rape, and Kick questionnaire    Fear of Current or Ex-Partner: No    Emotionally Abused: No    Physically Abused: No    Sexually Abused: No      PHYSICAL EXAM  Vitals:   05/05/24 1001  BP: 130/74  Pulse: 67  Weight: 221 lb (100.2 kg)  Height: 5' 4 (1.626 m)   Body mass index is 37.93 kg/m.  Generalized: Well developed, in no acute distress   Neurological examination  Mentation: Alert oriented to time, place, history taking. Follows all commands speech and language fluent.  Flat affect Cranial nerve II-XII: Pupils were equal round reactive to light. Extraocular movements were full, visual field were full on confrontational test. Facial sensation and strength were normal. Uvula tongue midline. Head turning and  shoulder shrug  were normal and symmetric. Motor: The motor testing reveals 5 over 5 strength of all 4 extremities. Good symmetric motor tone is noted throughout.  Slightly weaker in the left hand Sensory: Sensory testing is intact to soft touch on all 4 extremities. No evidence of extinction is noted.  Coordination: Cerebellar testing reveals good finger-nose-finger and heel-to-shin bilaterally.  Gait and station: Gait is normal.    DIAGNOSTIC DATA (LABS, IMAGING, TESTING) - I reviewed patient records, labs, notes, testing and imaging myself where available.  Lab Results  Component Value Date   WBC 6.3 06/18/2023   HGB 12.2 06/18/2023   HCT 38.0 06/18/2023   MCV 85.2 06/18/2023   PLT 256 06/18/2023      Component Value Date/Time   NA 139 06/18/2023 0518   K 3.7 06/18/2023 0518   CL 105 06/18/2023 0518   CO2 24 06/18/2023 0518   GLUCOSE 104 (H) 06/18/2023 0518   BUN 14 06/18/2023 0518   CREATININE 0.80 06/24/2023 0501   CALCIUM  8.7 (L) 06/18/2023 0518   PROT 6.2 (L) 06/18/2023 0518   ALBUMIN 3.1 (L) 06/18/2023 0518   AST 30 06/18/2023 0518   ALT 26 06/18/2023 0518   ALKPHOS 61 06/18/2023 0518   BILITOT 0.5 06/18/2023 0518   GFRNONAA >60 06/24/2023 0501   Lab Results  Component Value Date   CHOL 178 06/14/2023   HDL 51 06/14/2023   LDLCALC 111 (H) 06/14/2023   TRIG 78 06/14/2023   CHOLHDL 3.5 06/14/2023   Lab Results  Component Value Date   HGBA1C 5.6 06/14/2023     ASSESSMENT AND PLAN 63 y.o. year old female  has a past medical history of OSA (obstructive sleep apnea) and Thyroid activity decreased. here with:  Acute Ischemic Infarct:  right basal ganglia s/p TNK -acute infarct is adjacent to old hemorrhagic lacunar infarct in the same region Etiology:  Small Vessel disease Obstructive sleep apnea on CPAP  Overall patient has done well.  No additional strokelike symptoms  Continue aspirin  81 mg daily  for secondary stroke prevention.   Discussed secondary  stroke prevention measures and importance of close PCP follow up for aggressive stroke risk factor management. I have gone over the pathophysiology of stroke, warning signs and symptoms, risk factors and their management in some detail with instructions to go to the closest emergency room for symptoms of concern. HTN: BP goal <130/90.   HLD: LDL goal <70.  DMII: A1c goal<7.0.  Encouraged patient to monitor diet and encouraged exercise I did speak to the research department.  They advised that once the trial ends there is a number the patient can call as they continue to manage her CPAP and give her supplies.  They advised that they will reach out to the patient and give her this information. FU with our office as needed.      Duwaine Russell, MSN, NP-C 05/05/2024, 10:03 AM [facility] [address], [address] [place], [place] [address] [phone]

## 2024-05-05 NOTE — Patient Instructions (Signed)
Your Plan:  Continue ASA  Blood pressure goal <130/90 Cholesterol LDL goal <70 Diabetes goal A1c <7 Monitor diet and try to exercise   Thank you for coming to see us at [facility]. I hope we have been able to provide you high quality care today.  You may receive a patient satisfaction survey over the next few weeks. We would appreciate your feedback and comments so that we may continue to improve ourselves and the health of our patients.  

## 2024-06-14 ENCOUNTER — Encounter: Payer: Self-pay | Admitting: Radiology

## 2024-06-30 ENCOUNTER — Encounter: Payer: Self-pay | Admitting: Internal Medicine

## 2024-07-12 ENCOUNTER — Ambulatory Visit: Payer: Self-pay | Attending: Internal Medicine | Admitting: Internal Medicine

## 2024-07-12 ENCOUNTER — Encounter: Payer: Self-pay | Admitting: Internal Medicine

## 2024-07-12 VITALS — BP 120/79 | HR 70 | Temp 98.3°F | Ht 64.0 in | Wt 225.0 lb

## 2024-07-12 DIAGNOSIS — I6930 Unspecified sequelae of cerebral infarction: Secondary | ICD-10-CM

## 2024-07-12 DIAGNOSIS — E66812 Obesity, class 2: Secondary | ICD-10-CM

## 2024-07-12 DIAGNOSIS — Z6838 Body mass index (BMI) 38.0-38.9, adult: Secondary | ICD-10-CM

## 2024-07-12 DIAGNOSIS — R1032 Left lower quadrant pain: Secondary | ICD-10-CM

## 2024-07-12 DIAGNOSIS — Z1211 Encounter for screening for malignant neoplasm of colon: Secondary | ICD-10-CM

## 2024-07-12 DIAGNOSIS — I10 Essential (primary) hypertension: Secondary | ICD-10-CM

## 2024-07-12 DIAGNOSIS — Z2821 Immunization not carried out because of patient refusal: Secondary | ICD-10-CM

## 2024-07-12 DIAGNOSIS — G8929 Other chronic pain: Secondary | ICD-10-CM

## 2024-07-12 MED ORDER — LISINOPRIL 2.5 MG PO TABS: 2.5000 mg | ORAL_TABLET | Freq: Every day | ORAL | 1 refills | Status: AC

## 2024-07-12 MED ORDER — AMLODIPINE BESYLATE 10 MG PO TABS: 10.0000 mg | ORAL_TABLET | Freq: Every day | ORAL | 1 refills | Status: AC

## 2024-07-12 MED ORDER — ROSUVASTATIN CALCIUM 20 MG PO TABS: 20.0000 mg | ORAL_TABLET | Freq: Every day | ORAL | 1 refills | Status: AC

## 2024-07-12 NOTE — Patient Instructions (Addendum)
 VISIT SUMMARY: Today, you came in for your six-month follow-up appointment. We discussed your blood pressure, weight management, and persistent groin pain. We also reviewed your general health maintenance needs.  YOUR PLAN: -ESSENTIAL HYPERTENSION: Your blood pressure is being managed well with your current medications, lisinopril  2.5 mg daily and amlodipine  10 mg daily. Occasional high readings are likely due to stress. Please continue taking your medications as prescribed.  -OBESITY, CLASS 2: Your weight has increased by 17 pounds since May. We discussed weight loss options, but some medications are not suitable due to cost and your cardiovascular history. Focus on portion control, avoid high-calorie beverages, and choose healthy snacks like fruits, unsalted nuts, and low-fat yogurt.  -SEQUELAE OF CEREBRAL INFARCTION: You have persistent left groin pain since your stroke, which may be musculoskeletal. There is no hernia detected. Monitor the pain for any changes, and if it worsens, we may consider a CT scan of the pelvic area.  -GENERAL HEALTH MAINTENANCE: You are due for colon cancer screening, and we provided you with a stool kit for this. We also ordered blood tests to check your cholesterol, kidney, and liver function. You declined the flu and shingles vaccines.  INSTRUCTIONS: Please use the stool kit provided for colon cancer screening and return it as instructed. Continue monitoring your blood pressure and groin pain. If the pain worsens, contact us  to discuss further evaluation. Follow up with the blood tests as ordered.  Healthy Eating, Adult Healthy eating may help you get and keep a healthy body weight, reduce the risk of chronic disease, and live a long and productive life. It is important to follow a healthy eating pattern. Your nutritional and calorie needs should be met mainly by different nutrient-rich foods. What are tips for following this plan? Reading food labels Read labels  and choose the following: Reduced or low sodium products. Juices with 100% fruit juice. Foods with low saturated fats (<3 g per serving) and high polyunsaturated and monounsaturated fats. Foods with whole grains, such as whole wheat, cracked wheat, brown rice, and wild rice. Whole grains that are fortified with folic acid. This is recommended for females who are pregnant or who want to become pregnant. Read labels and do not eat or drink the following: Foods or drinks with added sugars. These include foods that contain brown sugar, corn sweetener, corn syrup, dextrose, fructose, glucose, high-fructose corn syrup, honey, invert sugar, lactose, malt syrup, maltose, molasses, raw sugar, sucrose, trehalose, or turbinado sugar. Limit your intake of added sugars to less than 10% of your total daily calories. Do not eat more than the following amounts of added sugar per day: 6 teaspoons (25 g) for females. 9 teaspoons (38 g) for males. Foods that contain processed or refined starches and grains. Refined grain products, such as white flour, degermed cornmeal, white bread, and white rice. Shopping Choose nutrient-rich snacks, such as vegetables, whole fruits, and nuts. Avoid high-calorie and high-sugar snacks, such as potato chips, fruit snacks, and candy. Use oil-based dressings and spreads on foods instead of solid fats such as butter, margarine, sour cream, or cream cheese. Limit pre-made sauces, mixes, and instant products such as flavored rice, instant noodles, and ready-made pasta. Try more plant-protein sources, such as tofu, tempeh, black beans, edamame, lentils, nuts, and seeds. Explore eating plans such as the Mediterranean diet or vegetarian diet. Try heart-healthy dips made with beans and healthy fats like hummus and guacamole. Vegetables go great with these. Cooking Use oil to saut or stir-fry foods instead  of solid fats such as butter, margarine, or lard. Try baking, boiling, grilling,  or broiling instead of frying. Remove the fatty part of meats before cooking. Steam vegetables in water or broth. Meal planning  At meals, imagine dividing your plate into fourths: One-half of your plate is fruits and vegetables. One-fourth of your plate is whole grains. One-fourth of your plate is protein, especially lean meats, poultry, eggs, tofu, beans, or nuts. Include low-fat dairy as part of your daily diet. Lifestyle Choose healthy options in all settings, including home, work, school, restaurants, or stores. Prepare your food safely: Wash your hands after handling raw meats. Where you prepare food, keep surfaces clean by regularly washing with hot, soapy water. Keep raw meats separate from ready-to-eat foods, such as fruits and vegetables. Cook seafood, meat, poultry, and eggs to the recommended temperature. Get a food thermometer. Store foods at safe temperatures. In general: Keep cold foods at 61F (4.4C) or below. Keep hot foods at 161F (60C) or above. Keep your freezer at [facility] (-17.8C) or below. Foods are not safe to eat if they have been between the temperatures of 40-161F (4.4-60C) for more than 2 hours. What foods should I eat? Fruits Aim to eat 1-2 cups of fresh, canned (in natural juice), or frozen fruits each day. One cup of fruit equals 1 small apple, 1 large banana, 8 large strawberries, 1 cup (237 g) canned fruit,  cup (82 g) dried fruit, or 1 cup (240 mL) 100% juice. Vegetables Aim to eat 2-4 cups of fresh and frozen vegetables each day, including different varieties and colors. One cup of vegetables equals 1 cup (91 g) broccoli or cauliflower florets, 2 medium carrots, 2 cups (150 g) raw, leafy greens, 1 large tomato, 1 large bell pepper, 1 large sweet potato, or 1 medium white potato. Grains Aim to eat 5-10 ounce-equivalents of whole grains each day. Examples of 1 ounce-equivalent of grains include 1 slice of bread, 1 cup (40 g) ready-to-eat cereal, 3 cups  (24 g) popcorn, or  cup (93 g) cooked rice. Meats and other proteins Try to eat 5-7 ounce-equivalents of protein each day. Examples of 1 ounce-equivalent of protein include 1 egg,  oz nuts (12 almonds, 24 pistachios, or 7 walnut halves), 1/4 cup (90 g) cooked beans, 6 tablespoons (90 g) hummus or 1 tablespoon (16 g) peanut butter. A cut of meat or fish that is the size of a deck of cards is about 3-4 ounce-equivalents (85 g). Of the protein you eat each week, try to have at least 8 sounce (227 g) of seafood. This is about 2 servings per week. This includes salmon, trout, herring, sardines, and anchovies. Dairy Aim to eat 3 cup-equivalents of fat-free or low-fat dairy each day. Examples of 1 cup-equivalent of dairy include 1 cup (240 mL) milk, 8 ounces (250 g) yogurt, 1 ounces (44 g) natural cheese, or 1 cup (240 mL) fortified soy milk. Fats and oils Aim for about 5 teaspoons (21 g) of fats and oils per day. Choose monounsaturated fats, such as canola and olive oils, mayonnaise made with olive oil or avocado oil, avocados, peanut butter, and most nuts, or polyunsaturated fats, such as sunflower, corn, and soybean oils, walnuts, pine nuts, sesame seeds, sunflower seeds, and flaxseed. Beverages Aim for 6 eight-ounce glasses of water per day. Limit coffee to 3-5 eight-ounce cups per day. Limit caffeinated beverages that have added calories, such as soda and energy drinks. If you drink alcohol: Limit how much you have  to: 0-1 drink a day if you are female. 0-2 drinks a day if you are female. Know how much alcohol is in your drink. In the [place] one drink is one 12 oz bottle of beer (355 mL), one 5 oz glass of wine (148 mL), or one 1 oz glass of hard liquor (44 mL). Seasoning and other foods Try not to add too much salt to your food. Try using herbs and spices instead of salt. Try not to add sugar to food. This information is based on [place] nutrition guidelines. To learn more, visit disposablenylon.be.  Exact amounts may vary. You may need different amounts. This information is not intended to replace advice given to you by your health care provider. Make sure you discuss any questions you have with your health care provider. Document Revised: 04/29/2022 Document Reviewed: 04/29/2022 Elsevier Patient Education  2024 Elsevier Inc.                      Contains text generated by [employer].                                 Contains text generated by [employer].

## 2024-07-12 NOTE — Progress Notes (Signed)
 Patient ID: Rebekah Mendez, female    DOB: 26-Sep-1960  MRN: [id number]  CC: Hypertension (HTN f/u. Nira weight / something full on lower L pelvic area /Discuss weight loss options - GLP-1/No to all vax. Yes to stool kit. )   Subjective: Rebekah Mendez is a 63 y.o. female who presents for chronic ds management. 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  History of Present Illness   Rebekah Mendez is a 63 year old female with hypertension and hyperlipidemia who presents for a six-month follow-up.  HTN: She is currently on lisinopril  2.5 mg daily and amlodipine  10 mg daily for blood pressure management. She occasionally checks her blood pressure, especially when she feels it might be elevated, and reports occasional readings of 140/unknown. No chest pain or shortness of breath. She experiences occasional water retention but no significant leg swelling. She has hx of CVA and continues to take Crestor  20 mg daily.  She has a pressure pain in the left groin area, noticed since her stroke about a year ago. The pain is located b/w the groin and upper thigh. Pain intensifies with walking long distances and is sometimes present when standing. It is located in the upper inner groin and is not associated with any noticeable bulge or exacerbation with lifting, coughing, sneezing, or laughing.  She is interested in weight loss options and has gained 17 pounds since May, now weighing 225 pounds. She has a history of trying weight loss products from the internet or local places. She does not have insurance or Medicaid, which limits her access to certain weight loss medications. She acknowledges difficulty with portion control and has started drinking sodas  again about once a day, though she primarily consumes flavored water. She does not exercise regularly but can walk significant distances when necessary.  She continues to take rosuvastatin  20 mg for hyperlipidemia management.        Patient Active Problem List   Diagnosis Date Noted   OSA on CPAP 11/04/2023   History of cerebrovascular accident (CVA) with residual deficit 07/06/2023   Lesion of skin of nose 07/06/2023   Coping style affecting medical condition 06/24/2023   Essential hypertension 06/17/2023   Tobacco use disorder 06/17/2023   Obesity 06/17/2023   Cerebrovascular accident (CVA) of right basal ganglia (HCC) 06/17/2023   Infarction of right basal ganglia (HCC) 06/13/2023     Current Outpatient Medications on File Prior to Visit  Medication Sig Dispense Refill   aspirin  EC 81 MG tablet Take 1 tablet (81 mg total) by mouth daily. Swallow whole. 100 tablet 0   melatonin 3 MG TABS tablet Take 1 tablet (3 mg total) by mouth at bedtime. 30 tablet 0   senna-docusate (SENOKOT-S) 8.6-50 MG tablet Take 1 tablet by mouth at bedtime. (Patient not taking: Reported on 01/08/2024)     No current facility-administered medications on file prior to visit.    Allergies  Allergen Reactions   Amoxicillin Hives    Social History   Socioeconomic History   Marital status: Married    Spouse name: Not on file   Number of children: Not on file   Years of education: Not on file   Highest education level: Bachelor's degree (e.g., BA, AB, BS)  Occupational History   Not on file  Tobacco Use   Smoking status: Former    Current packs/day: 0.00    Types: Cigarettes    Quit date: 04/2023    Years since quitting: 1.2   Smokeless tobacco: Never  Vaping Use   Vaping status: Never Used  Substance and Sexual Activity   Alcohol use: Yes    Comment: occ   Drug use: No   Sexual activity: Not on file  Other Topics Concern   Not on file  Social History Narrative   Pt lives with family     Pt works    Social Drivers of Corporate Investment Banker Strain: Low Risk  (07/09/2024)   Overall Financial Resource Strain (CARDIA)    Difficulty of Paying Living Expenses: Not hard at all  Food Insecurity: No Food Insecurity (07/09/2024)   Hunger Vital Sign    Worried About Running Out of Food in the Last Year: Never true    Ran Out of Food in the Last Year: Never true  Transportation Needs: No Transportation Needs (07/09/2024)   PRAPARE - Administrator, Civil Service (Medical): No    Lack of Transportation (Non-Medical): No  Physical Activity: Inactive (07/09/2024)   Exercise Vital Sign    Days of Exercise per Week: 0 days    Minutes of Exercise per Session: Not on file  Stress: No Stress Concern Present (07/09/2024)   [employer] of Occupational Health - Occupational Stress Questionnaire    Feeling of Stress: Only a little  Social Connections: Unknown (07/09/2024)   Social Connection and Isolation Panel    Frequency of Communication with Friends and Family: Twice a week    Frequency of Social Gatherings with Friends and Family: Once a week    Attends Religious Services: More than 4 times per year    Active Member of [employer] or Organizations: Patient declined    Attends Banker Meetings: Not on file    Marital Status: Married  Intimate Partner Violence: Not At Risk (11/04/2023)   Humiliation, Afraid, Rape, and Kick questionnaire    Fear of Current or Ex-Partner: No    Emotionally Abused: No    Physically Abused: No    Sexually Abused: No    Family History  Problem Relation Age of Onset   Stroke Paternal Uncle    Colon cancer Neg Hx     Past Surgical History:  Procedure Laterality Date   CESAREAN SECTION     Feb '89   EXCISIONAL HEMORRHOIDECTOMY      ROS: Review of Systems Negative except as stated above  PHYSICAL EXAM: BP 120/79 (BP Location: Left Arm, Patient Position: Sitting, Cuff Size: Normal)   Pulse 70   Temp 98.3 F  (36.8 C) (Oral)   Ht 5' 4 (1.626 m)   Wt 225 lb (102.1 kg)   SpO2 97%   BMI 38.62 kg/m   Wt Readings from Last 3 Encounters:  07/12/24 225 lb (102.1 kg)  05/05/24 221 lb (100.2 kg)  01/08/24 208 lb (94.3 kg)    Physical Exam  General appearance - alert, well appearing, and in no distress Mental status - pt tearful at times when talking about her weight Chest - clear to auscultation, no wheezes, rales or rhonchi, symmetric air entry Heart - normal rate, regular rhythm, normal S1, S2, no murmurs, rubs, clicks or gallops Musculoskeletal - LT groin: my CMA Clarisa is present - no abnormal masses or hernia felt. Non-tender  to touch Abdomen: soft and nontender Extremities - peripheral pulses normal, no pedal edema, no clubbing or cyanosis      Latest Ref Rng & Units 06/24/2023    5:01 AM 06/18/2023    5:18 AM 06/17/2023    4:52 AM  CMP  Glucose 70 - 99 mg/dL  895  898   BUN 6 - 20 mg/dL  14  14   Creatinine 9.55 - 1.00 mg/dL 9.19  9.36  9.35   Sodium 135 - 145 mmol/L  139  138   Potassium 3.5 - 5.1 mmol/L  3.7  3.5   Chloride 98 - 111 mmol/L  105  106   CO2 22 - 32 mmol/L  24  23   Calcium  8.9 - 10.3 mg/dL  8.7  8.5   Total Protein 6.5 - 8.1 g/dL  6.2    Total Bilirubin <1.2 mg/dL  0.5    Alkaline Phos 38 - 126 U/L  61    AST 15 - 41 U/L  30    ALT 0 - 44 U/L  26     Lipid Panel     Component Value Date/Time   CHOL 178 06/14/2023 0220   TRIG 78 06/14/2023 0220   HDL 51 06/14/2023 0220   CHOLHDL 3.5 06/14/2023 0220   VLDL 16 06/14/2023 0220   LDLCALC 111 (H) 06/14/2023 0220    CBC    Component Value Date/Time   WBC 6.3 06/18/2023 0518   RBC 4.46 06/18/2023 0518   HGB 12.2 06/18/2023 0518   HCT 38.0 06/18/2023 0518   PLT 256 06/18/2023 0518   MCV 85.2 06/18/2023 0518   MCH 27.4 06/18/2023 0518   MCHC 32.1 06/18/2023 0518   RDW 12.9 06/18/2023 0518   LYMPHSABS 2.2 06/18/2023 0518   MONOABS 0.5 06/18/2023 0518   EOSABS 0.2 06/18/2023 0518   BASOSABS 0.1  06/18/2023 0518    ASSESSMENT AND PLAN: 1. Essential hypertension (Primary) At goal. Continue Lisinopril  and Norvasc  - lisinopril  (ZESTRIL ) 2.5 MG tablet; Take 1 tablet (2.5 mg total) by mouth daily.  Dispense: 90 tablet; Refill: 1 - amLODipine  (NORVASC ) 10 MG tablet; Take 1 tablet (10 mg total) by mouth daily.  Dispense: 90 tablet; Refill: 1 - CBC - Comprehensive metabolic panel with GFR  2. History of cerebrovascular accident (CVA) with residual deficit Continue Crestor  - rosuvastatin  (CRESTOR ) 20 MG tablet; Take 1 tablet (20 mg total) by mouth daily.  Dispense: 90 tablet; Refill: 1 - Lipid panel  3. Class 2 severe obesity due to excess calories with serious comorbidity and body mass index (BMI) of 38.0 to 38.9 in adult Discussed GLP-1 agents; cost prohibitive without insurance. Phentermine not recommended due to cardiovascular history. - Encouraged portion control and avoidance of caloric beverages. Eat smaller portions of white carbs. - Encouraged snacking on fruits, unsalted nuts, and low-fat yogurt.  4. Groin pain, chronic, left I think likely MSK in nature vs small hernia. Advised that we can do CT scan pelvis but pt opted to observe for now and if gets worse she will reconsider. Advise to observe if worse with lifting, coughing, sneezing etc  5. Influenza vaccination declined 6. Herpes zoster vaccination declined 7. Pneumococcal vaccination declined Pt declined these vaccines  8. Screening for colon cancer - Fecal occult blood, imunochemical([facility])   Patient was given the opportunity to ask questions.  Patient verbalized understanding of the plan and was able to repeat key elements of the plan.   This documentation was completed using  Paediatric nurse.  Any transcriptional errors are unintentional.  Orders Placed This Encounter  Procedures   Fecal occult blood, imunochemical([facility])   CBC   Comprehensive metabolic panel with  GFR   Lipid panel     Requested Prescriptions   Signed Prescriptions Disp Refills   rosuvastatin  (CRESTOR ) 20 MG tablet 90 tablet 1    Sig: Take 1 tablet (20 mg total) by mouth daily.   lisinopril  (ZESTRIL ) 2.5 MG tablet 90 tablet 1    Sig: Take 1 tablet (2.5 mg total) by mouth daily.   amLODipine  (NORVASC ) 10 MG tablet 90 tablet 1    Sig: Take 1 tablet (10 mg total) by mouth daily.    Return in about 6 months (around 01/10/2025).  Barnie Louder, MD, FACP

## 2024-07-13 ENCOUNTER — Ambulatory Visit: Payer: Self-pay | Admitting: Internal Medicine

## 2024-07-13 LAB — CBC
Hematocrit: 42.7 % (ref 34.0–46.6)
Hemoglobin: 13.2 g/dL (ref 11.1–15.9)
MCH: 27.5 pg (ref 26.6–33.0)
MCHC: 30.9 g/dL — ABNORMAL LOW (ref 31.5–35.7)
MCV: 89 fL (ref 79–97)
Platelets: 317 x10E3/uL (ref 150–450)
RBC: 4.8 x10E6/uL (ref 3.77–5.28)
RDW: 13 % (ref 11.7–15.4)
WBC: 7.3 x10E3/uL (ref 3.4–10.8)

## 2024-07-13 LAB — COMPREHENSIVE METABOLIC PANEL WITH GFR
ALT: 22 IU/L (ref 0–32)
AST: 19 IU/L (ref 0–40)
Albumin: 4.5 g/dL (ref 3.9–4.9)
Alkaline Phosphatase: 81 IU/L (ref 49–135)
BUN/Creatinine Ratio: 19 (ref 12–28)
BUN: 13 mg/dL (ref 8–27)
Bilirubin Total: 0.4 mg/dL (ref 0.0–1.2)
CO2: 21 mmol/L (ref 20–29)
Calcium: 9.2 mg/dL (ref 8.7–10.3)
Chloride: 104 mmol/L (ref 96–106)
Creatinine, Ser: 0.69 mg/dL (ref 0.57–1.00)
Globulin, Total: 2.7 g/dL (ref 1.5–4.5)
Glucose: 93 mg/dL (ref 70–99)
Potassium: 4.7 mmol/L (ref 3.5–5.2)
Sodium: 143 mmol/L (ref 134–144)
Total Protein: 7.2 g/dL (ref 6.0–8.5)
eGFR: 98 mL/min/1.73 (ref >59)

## 2024-07-13 LAB — LIPID PANEL
Chol/HDL Ratio: 1.9 ratio (ref 0.0–4.4)
Cholesterol, Total: 108 mg/dL (ref 100–199)
HDL: 57 mg/dL (ref >39)
LDL Chol Calc (NIH): 33 mg/dL (ref 0–99)
Triglycerides: 94 mg/dL (ref 0–149)
VLDL Cholesterol Cal: 18 mg/dL (ref 5–40)

## 2024-07-15 LAB — FECAL OCCULT BLOOD, IMMUNOCHEMICAL: Fecal Occult Bld: NEGATIVE

## 2025-01-10 ENCOUNTER — Ambulatory Visit: Admitting: Internal Medicine
